# Patient Record
Sex: Male | Born: 1955
Health system: Southern US, Community
[De-identification: ages and names within clinical notes are randomized; demographics above are authoritative.]

## PROBLEM LIST (undated history)

## (undated) DIAGNOSIS — M549 Dorsalgia, unspecified: Secondary | ICD-10-CM

## (undated) DIAGNOSIS — G252 Other specified forms of tremor: Secondary | ICD-10-CM

## (undated) DIAGNOSIS — G250 Essential tremor: Secondary | ICD-10-CM

## (undated) DIAGNOSIS — J309 Allergic rhinitis, unspecified: Secondary | ICD-10-CM

## (undated) DIAGNOSIS — K219 Gastro-esophageal reflux disease without esophagitis: Secondary | ICD-10-CM

## (undated) DIAGNOSIS — F528 Other sexual dysfunction not due to a substance or known physiological condition: Secondary | ICD-10-CM

## (undated) DIAGNOSIS — M722 Plantar fascial fibromatosis: Secondary | ICD-10-CM

## (undated) DIAGNOSIS — M5090 Cervical disc disorder, unspecified, unspecified cervical region: Secondary | ICD-10-CM

## (undated) DIAGNOSIS — B351 Tinea unguium: Secondary | ICD-10-CM

## (undated) DIAGNOSIS — I10 Essential (primary) hypertension: Secondary | ICD-10-CM

## (undated) DIAGNOSIS — J45909 Unspecified asthma, uncomplicated: Secondary | ICD-10-CM

## (undated) DIAGNOSIS — K5730 Diverticulosis of large intestine without perforation or abscess without bleeding: Secondary | ICD-10-CM

## (undated) DIAGNOSIS — E119 Type 2 diabetes mellitus without complications: Secondary | ICD-10-CM

## (undated) DIAGNOSIS — E785 Hyperlipidemia, unspecified: Secondary | ICD-10-CM

## (undated) DIAGNOSIS — G4730 Sleep apnea, unspecified: Secondary | ICD-10-CM

## (undated) HISTORY — DX: Cervical disc disorder, unspecified, unspecified cervical region: M50.90

## (undated) HISTORY — DX: Unspecified asthma, uncomplicated: J45.909

## (undated) HISTORY — DX: Type 2 diabetes mellitus without complications: E11.9

## (undated) HISTORY — DX: Essential (primary) hypertension: I10

## (undated) HISTORY — DX: Allergic rhinitis, unspecified: J30.9

## (undated) HISTORY — DX: Plantar fascial fibromatosis: M72.2

## (undated) HISTORY — DX: Dorsalgia, unspecified: M54.9

## (undated) HISTORY — DX: Hyperlipidemia, unspecified: E78.5

## (undated) HISTORY — DX: Essential tremor: G25.0

## (undated) HISTORY — DX: Tinea unguium: B35.1

## (undated) HISTORY — DX: Sleep apnea, unspecified: G47.30

## (undated) HISTORY — DX: Diverticulosis of large intestine without perforation or abscess without bleeding: K57.30

## (undated) HISTORY — DX: Essential tremor: G25.2

## (undated) HISTORY — DX: Gastro-esophageal reflux disease without esophagitis: K21.9

## (undated) HISTORY — DX: Other sexual dysfunction not due to a substance or known physiological condition: F52.8

---

## 1998-01-10 ENCOUNTER — Ambulatory Visit (HOSPITAL_COMMUNITY): Admission: RE | Admit: 1998-01-10 | Discharge: 1998-01-10 | Payer: Self-pay | Admitting: Family Medicine

## 2002-11-22 ENCOUNTER — Encounter: Admission: RE | Admit: 2002-11-22 | Discharge: 2003-02-20 | Payer: Self-pay | Admitting: Internal Medicine

## 2005-01-24 ENCOUNTER — Ambulatory Visit: Payer: Self-pay | Admitting: Internal Medicine

## 2005-01-29 ENCOUNTER — Ambulatory Visit: Payer: Self-pay | Admitting: Internal Medicine

## 2006-01-28 ENCOUNTER — Ambulatory Visit: Payer: Self-pay | Admitting: Internal Medicine

## 2006-02-04 ENCOUNTER — Ambulatory Visit: Payer: Self-pay | Admitting: Internal Medicine

## 2007-03-08 ENCOUNTER — Encounter: Payer: Self-pay | Admitting: *Deleted

## 2007-03-08 DIAGNOSIS — E785 Hyperlipidemia, unspecified: Secondary | ICD-10-CM | POA: Insufficient documentation

## 2007-03-08 DIAGNOSIS — I10 Essential (primary) hypertension: Secondary | ICD-10-CM | POA: Insufficient documentation

## 2007-03-08 DIAGNOSIS — E119 Type 2 diabetes mellitus without complications: Secondary | ICD-10-CM | POA: Insufficient documentation

## 2007-03-08 DIAGNOSIS — E1122 Type 2 diabetes mellitus with diabetic chronic kidney disease: Secondary | ICD-10-CM | POA: Insufficient documentation

## 2007-03-08 DIAGNOSIS — F528 Other sexual dysfunction not due to a substance or known physiological condition: Secondary | ICD-10-CM | POA: Insufficient documentation

## 2007-03-11 ENCOUNTER — Ambulatory Visit: Payer: Self-pay | Admitting: Internal Medicine

## 2007-03-11 LAB — CONVERTED CEMR LAB
ALT: 34 U/L
AST: 25 U/L
Albumin: 3.9 g/dL
Alkaline Phosphatase: 85 U/L
BUN: 17 mg/dL
Bacteria, UA: NEGATIVE
Basophils Absolute: 0.1 K/uL
Basophils Relative: 0.7 %
Bilirubin, Direct: 0.2 mg/dL
CO2: 31 meq/L
Calcium: 9.1 mg/dL
Chloride: 102 meq/L
Cholesterol: 163 mg/dL
Creatinine, Ser: 1 mg/dL
Crystals: NEGATIVE
Eosinophils Absolute: 0.3 K/uL
Eosinophils Relative: 4.3 %
GFR calc Af Amer: 102 mL/min
GFR calc non Af Amer: 84 mL/min
Glucose, Bld: 216 mg/dL — ABNORMAL HIGH
HCT: 39.3 %
HDL: 26 mg/dL — ABNORMAL LOW
Hemoglobin, Urine: NEGATIVE
Hemoglobin: 13.5 g/dL
Hgb A1c MFr Bld: 8.5 % — ABNORMAL HIGH
Ketones, ur: NEGATIVE mg/dL
Leukocytes, UA: NEGATIVE
Lymphocytes Relative: 19.6 %
MCHC: 34.3 g/dL
MCV: 82.1 fL
Monocytes Absolute: 0.8 K/uL — ABNORMAL HIGH
Monocytes Relative: 10.1 %
Neutro Abs: 5.3 K/uL
Neutrophils Relative %: 65.3 %
Nitrite: NEGATIVE
PSA: 0.76 ng/mL
Platelets: 140 K/uL — ABNORMAL LOW
Potassium: 3.8 meq/L
RBC: 4.79 M/uL
RDW: 12.9 %
Sodium: 142 meq/L
Specific Gravity, Urine: >=1.03
Squamous Epithelial / HPF: NEGATIVE /LPF
TSH: 1.11 u[IU]/mL
Total Bilirubin: 1 mg/dL
Total CHOL/HDL Ratio: 6.3
Total Protein, Urine: 30 mg/dL — AB
Total Protein: 7 g/dL
Triglycerides: 268 mg/dL
Urine Glucose: 100 mg/dL — AB
Urobilinogen, UA: 1
VLDL: 54 mg/dL — ABNORMAL HIGH
WBC: 8.1 10*3/microliter
pH: 5.5

## 2007-03-16 ENCOUNTER — Ambulatory Visit: Payer: Self-pay | Admitting: Internal Medicine

## 2007-03-16 DIAGNOSIS — J45909 Unspecified asthma, uncomplicated: Secondary | ICD-10-CM | POA: Insufficient documentation

## 2007-03-16 DIAGNOSIS — B351 Tinea unguium: Secondary | ICD-10-CM | POA: Insufficient documentation

## 2007-03-16 DIAGNOSIS — J309 Allergic rhinitis, unspecified: Secondary | ICD-10-CM | POA: Insufficient documentation

## 2007-05-11 ENCOUNTER — Encounter: Payer: Self-pay | Admitting: Internal Medicine

## 2007-05-12 ENCOUNTER — Ambulatory Visit: Payer: Self-pay | Admitting: Internal Medicine

## 2007-05-12 LAB — CONVERTED CEMR LAB
BUN: 18 mg/dL (ref 6–23)
CO2: 29 meq/L (ref 19–32)
Calcium: 9.5 mg/dL (ref 8.4–10.5)
Chloride: 100 meq/L (ref 96–112)
Cholesterol: 160 mg/dL (ref 0–200)
Creatinine, Ser: 0.9 mg/dL (ref 0.4–1.5)
Direct LDL: 84 mg/dL
GFR calc Af Amer: 115 mL/min
GFR calc non Af Amer: 95 mL/min
Glucose, Bld: 147 mg/dL — ABNORMAL HIGH (ref 70–99)
HDL: 24 mg/dL — ABNORMAL LOW (ref >39.0)
Hgb A1c MFr Bld: 7.1 % — ABNORMAL HIGH (ref 4.6–6.0)
Potassium: 4.5 meq/L (ref 3.5–5.1)
Sodium: 137 meq/L (ref 135–145)
Testosterone: 368.37 ng/dL (ref 350.00–890)
Total CHOL/HDL Ratio: 6.7
Triglycerides: 295 mg/dL (ref 0–149)
VLDL: 59 mg/dL — ABNORMAL HIGH (ref 0–40)

## 2007-06-07 ENCOUNTER — Telehealth (INDEPENDENT_AMBULATORY_CARE_PROVIDER_SITE_OTHER): Payer: Self-pay | Admitting: *Deleted

## 2007-09-01 ENCOUNTER — Ambulatory Visit: Payer: Self-pay | Admitting: Internal Medicine

## 2007-09-01 LAB — CONVERTED CEMR LAB
BUN: 20 mg/dL (ref 6–23)
CO2: 29 meq/L (ref 19–32)
Calcium: 9.6 mg/dL (ref 8.4–10.5)
Chloride: 106 meq/L (ref 96–112)
Cholesterol: 141 mg/dL (ref 0–200)
Creatinine, Ser: 1.1 mg/dL (ref 0.4–1.5)
Direct LDL: 77.3 mg/dL
GFR calc Af Amer: 91 mL/min
GFR calc non Af Amer: 75 mL/min
Glucose, Bld: 114 mg/dL — ABNORMAL HIGH (ref 70–99)
HDL: 31.4 mg/dL — ABNORMAL LOW (ref >39.0)
Hgb A1c MFr Bld: 5.9 % (ref 4.6–6.0)
Potassium: 4.5 meq/L (ref 3.5–5.1)
Sodium: 144 meq/L (ref 135–145)
Total CHOL/HDL Ratio: 4.5
Triglycerides: 252 mg/dL (ref 0–149)
VLDL: 50 mg/dL — ABNORMAL HIGH (ref 0–40)

## 2007-09-05 ENCOUNTER — Encounter: Payer: Self-pay | Admitting: Internal Medicine

## 2007-09-06 ENCOUNTER — Ambulatory Visit: Payer: Self-pay | Admitting: Internal Medicine

## 2008-03-14 ENCOUNTER — Telehealth (INDEPENDENT_AMBULATORY_CARE_PROVIDER_SITE_OTHER): Payer: Self-pay | Admitting: *Deleted

## 2008-04-21 ENCOUNTER — Telehealth: Payer: Self-pay | Admitting: Internal Medicine

## 2008-05-01 ENCOUNTER — Ambulatory Visit: Payer: Self-pay | Admitting: Internal Medicine

## 2008-05-01 LAB — CONVERTED CEMR LAB
ALT: 30 units/L (ref 0–53)
AST: 25 units/L (ref 0–37)
Albumin: 4 g/dL (ref 3.5–5.2)
Alkaline Phosphatase: 73 units/L (ref 39–117)
BUN: 29 mg/dL — ABNORMAL HIGH (ref 6–23)
Basophils Absolute: 0.1 10*3/uL (ref 0.0–0.1)
Basophils Relative: 1.1 % (ref 0.0–3.0)
Bilirubin Urine: NEGATIVE
Bilirubin, Direct: 0.1 mg/dL (ref 0.0–0.3)
CO2: 29 meq/L (ref 19–32)
Calcium: 9.5 mg/dL (ref 8.4–10.5)
Chloride: 104 meq/L (ref 96–112)
Cholesterol: 163 mg/dL (ref 0–200)
Creatinine, Ser: 1.2 mg/dL (ref 0.4–1.5)
Direct LDL: 93.1 mg/dL
Eosinophils Absolute: 0.5 10*3/uL (ref 0.0–0.7)
Eosinophils Relative: 4.6 % (ref 0.0–5.0)
GFR calc Af Amer: 82 mL/min
GFR calc non Af Amer: 68 mL/min
Glucose, Bld: 131 mg/dL — ABNORMAL HIGH (ref 70–99)
HCT: 39.7 % (ref 39.0–52.0)
HDL: 32.9 mg/dL — ABNORMAL LOW (ref >39.0)
Hemoglobin, Urine: NEGATIVE
Hemoglobin: 14 g/dL (ref 13.0–17.0)
Hgb A1c MFr Bld: 6.2 % — ABNORMAL HIGH (ref 4.6–6.0)
Ketones, ur: NEGATIVE mg/dL
Leukocytes, UA: NEGATIVE
Lymphocytes Relative: 24.6 % (ref 12.0–46.0)
MCHC: 35.2 g/dL (ref 30.0–36.0)
MCV: 82.9 fL (ref 78.0–100.0)
Monocytes Absolute: 0.9 10*3/uL (ref 0.1–1.0)
Monocytes Relative: 9.1 % (ref 3.0–12.0)
Neutro Abs: 6 10*3/uL (ref 1.4–7.7)
Neutrophils Relative %: 60.6 % (ref 43.0–77.0)
Nitrite: NEGATIVE
PSA: 0.77 ng/mL (ref 0.10–4.00)
Platelets: 150 10*3/uL (ref 150–400)
Potassium: 4.8 meq/L (ref 3.5–5.1)
RBC: 4.79 M/uL (ref 4.22–5.81)
RDW: 13 % (ref 11.5–14.6)
Sodium: 141 meq/L (ref 135–145)
Specific Gravity, Urine: >=1.03 (ref 1.000–1.03)
TSH: 1.93 microintl units/mL (ref 0.35–5.50)
Total Bilirubin: 0.8 mg/dL (ref 0.3–1.2)
Total CHOL/HDL Ratio: 5
Total Protein, Urine: NEGATIVE mg/dL
Total Protein: 7.3 g/dL (ref 6.0–8.3)
Triglycerides: 218 mg/dL (ref 0–149)
Urine Glucose: NEGATIVE mg/dL
Urobilinogen, UA: 0.2 (ref 0.0–1.0)
VLDL: 44 mg/dL — ABNORMAL HIGH (ref 0–40)
WBC: 9.9 10*3/uL (ref 4.5–10.5)
pH: 5.5 (ref 5.0–8.0)

## 2008-05-08 ENCOUNTER — Ambulatory Visit: Payer: Self-pay | Admitting: Internal Medicine

## 2008-05-08 DIAGNOSIS — R251 Tremor, unspecified: Secondary | ICD-10-CM | POA: Insufficient documentation

## 2008-05-08 DIAGNOSIS — K219 Gastro-esophageal reflux disease without esophagitis: Secondary | ICD-10-CM | POA: Insufficient documentation

## 2008-05-08 DIAGNOSIS — R21 Rash and other nonspecific skin eruption: Secondary | ICD-10-CM | POA: Insufficient documentation

## 2008-09-12 ENCOUNTER — Telehealth (INDEPENDENT_AMBULATORY_CARE_PROVIDER_SITE_OTHER): Payer: Self-pay | Admitting: *Deleted

## 2008-09-13 ENCOUNTER — Telehealth (INDEPENDENT_AMBULATORY_CARE_PROVIDER_SITE_OTHER): Payer: Self-pay | Admitting: *Deleted

## 2008-09-15 ENCOUNTER — Ambulatory Visit: Payer: Self-pay | Admitting: Internal Medicine

## 2009-01-10 ENCOUNTER — Encounter (INDEPENDENT_AMBULATORY_CARE_PROVIDER_SITE_OTHER): Payer: Self-pay | Admitting: *Deleted

## 2009-04-17 ENCOUNTER — Telehealth: Payer: Self-pay | Admitting: Internal Medicine

## 2009-05-14 ENCOUNTER — Telehealth: Payer: Self-pay | Admitting: Internal Medicine

## 2009-05-28 ENCOUNTER — Telehealth: Payer: Self-pay | Admitting: Internal Medicine

## 2009-06-04 ENCOUNTER — Ambulatory Visit: Payer: Self-pay | Admitting: Internal Medicine

## 2009-06-04 LAB — CONVERTED CEMR LAB
ALT: 46 units/L (ref 0–53)
AST: 31 units/L (ref 0–37)
Albumin: 4.1 g/dL (ref 3.5–5.2)
Alkaline Phosphatase: 76 units/L (ref 39–117)
BUN: 26 mg/dL — ABNORMAL HIGH (ref 6–23)
Basophils Absolute: 0.1 10*3/uL (ref 0.0–0.1)
Basophils Relative: 0.6 % (ref 0.0–3.0)
Bilirubin Urine: NEGATIVE
Bilirubin, Direct: 0.1 mg/dL (ref 0.0–0.3)
CO2: 29 meq/L (ref 19–32)
Calcium: 9.5 mg/dL (ref 8.4–10.5)
Chloride: 103 meq/L (ref 96–112)
Cholesterol: 154 mg/dL (ref 0–200)
Creatinine, Ser: 1.1 mg/dL (ref 0.4–1.5)
Direct LDL: 85.5 mg/dL
Eosinophils Absolute: 0.3 10*3/uL (ref 0.0–0.7)
Eosinophils Relative: 3.2 % (ref 0.0–5.0)
GFR calc non Af Amer: 74.73 mL/min (ref >60)
Glucose, Bld: 145 mg/dL — ABNORMAL HIGH (ref 70–99)
HCT: 38.9 % — ABNORMAL LOW (ref 39.0–52.0)
HDL: 34.2 mg/dL — ABNORMAL LOW (ref >39.00)
Hemoglobin, Urine: NEGATIVE
Hemoglobin: 12.8 g/dL — ABNORMAL LOW (ref 13.0–17.0)
Leukocytes, UA: NEGATIVE
Lymphocytes Relative: 22.8 % (ref 12.0–46.0)
Lymphs Abs: 2 10*3/uL (ref 0.7–4.0)
MCHC: 32.9 g/dL (ref 30.0–36.0)
MCV: 84.1 fL (ref 78.0–100.0)
Monocytes Absolute: 0.6 10*3/uL (ref 0.1–1.0)
Monocytes Relative: 6.5 % (ref 3.0–12.0)
Neutro Abs: 5.9 10*3/uL (ref 1.4–7.7)
Neutrophils Relative %: 66.9 % (ref 43.0–77.0)
Nitrite: NEGATIVE
PSA: 1.21 ng/mL (ref 0.10–4.00)
Platelets: 149 10*3/uL — ABNORMAL LOW (ref 150.0–400.0)
Potassium: 4.5 meq/L (ref 3.5–5.1)
RBC: 4.62 M/uL (ref 4.22–5.81)
RDW: 13.8 % (ref 11.5–14.6)
Sodium: 138 meq/L (ref 135–145)
Specific Gravity, Urine: >=1.03 (ref 1.000–1.030)
TSH: 1.62 microintl units/mL (ref 0.35–5.50)
Total Bilirubin: 0.8 mg/dL (ref 0.3–1.2)
Total CHOL/HDL Ratio: 5
Total Protein: 7.6 g/dL (ref 6.0–8.3)
Triglycerides: 296 mg/dL — ABNORMAL HIGH (ref 0.0–149.0)
Urine Glucose: NEGATIVE mg/dL
Urobilinogen, UA: 0.2 (ref 0.0–1.0)
VLDL: 59.2 mg/dL — ABNORMAL HIGH (ref 0.0–40.0)
WBC: 8.9 10*3/uL (ref 4.5–10.5)
pH: 5 (ref 5.0–8.0)

## 2009-06-06 ENCOUNTER — Ambulatory Visit: Payer: Self-pay | Admitting: Internal Medicine

## 2009-06-06 ENCOUNTER — Telehealth (INDEPENDENT_AMBULATORY_CARE_PROVIDER_SITE_OTHER): Payer: Self-pay | Admitting: *Deleted

## 2009-06-07 LAB — CONVERTED CEMR LAB: Hgb A1c MFr Bld: 6.4 % (ref 4.6–6.5)

## 2009-08-09 ENCOUNTER — Encounter: Payer: Self-pay | Admitting: Internal Medicine

## 2009-11-30 ENCOUNTER — Ambulatory Visit: Payer: Self-pay | Admitting: Internal Medicine

## 2009-11-30 LAB — CONVERTED CEMR LAB
BUN: 32 mg/dL — ABNORMAL HIGH (ref 6–23)
CO2: 28 meq/L (ref 19–32)
Calcium: 9.2 mg/dL (ref 8.4–10.5)
Chloride: 104 meq/L (ref 96–112)
Cholesterol: 176 mg/dL (ref 0–200)
Creatinine, Ser: 1.3 mg/dL (ref 0.4–1.5)
Direct LDL: 107.7 mg/dL
GFR calc non Af Amer: 61.51 mL/min (ref >60)
Glucose, Bld: 103 mg/dL — ABNORMAL HIGH (ref 70–99)
HDL: 31.4 mg/dL — ABNORMAL LOW (ref >39.00)
Hgb A1c MFr Bld: 6.3 % (ref 4.6–6.5)
Potassium: 4.9 meq/L (ref 3.5–5.1)
Sodium: 141 meq/L (ref 135–145)
Total CHOL/HDL Ratio: 6
Triglycerides: 280 mg/dL — ABNORMAL HIGH (ref 0.0–149.0)
VLDL: 56 mg/dL — ABNORMAL HIGH (ref 0.0–40.0)

## 2009-12-12 ENCOUNTER — Ambulatory Visit: Payer: Self-pay | Admitting: Internal Medicine

## 2009-12-12 DIAGNOSIS — M722 Plantar fascial fibromatosis: Secondary | ICD-10-CM | POA: Insufficient documentation

## 2009-12-12 DIAGNOSIS — K5730 Diverticulosis of large intestine without perforation or abscess without bleeding: Secondary | ICD-10-CM | POA: Insufficient documentation

## 2009-12-12 DIAGNOSIS — G4710 Hypersomnia, unspecified: Secondary | ICD-10-CM | POA: Insufficient documentation

## 2009-12-17 ENCOUNTER — Encounter: Payer: Self-pay | Admitting: Internal Medicine

## 2009-12-17 DIAGNOSIS — R259 Unspecified abnormal involuntary movements: Secondary | ICD-10-CM | POA: Insufficient documentation

## 2010-01-18 ENCOUNTER — Ambulatory Visit: Payer: Self-pay | Admitting: Internal Medicine

## 2010-01-18 DIAGNOSIS — M549 Dorsalgia, unspecified: Secondary | ICD-10-CM | POA: Insufficient documentation

## 2010-01-22 ENCOUNTER — Telehealth: Payer: Self-pay | Admitting: Internal Medicine

## 2010-01-25 ENCOUNTER — Ambulatory Visit: Payer: Self-pay | Admitting: Pulmonary Disease

## 2010-01-25 DIAGNOSIS — G4730 Sleep apnea, unspecified: Secondary | ICD-10-CM | POA: Insufficient documentation

## 2010-01-30 ENCOUNTER — Encounter: Payer: Self-pay | Admitting: Internal Medicine

## 2010-02-01 ENCOUNTER — Encounter: Admission: RE | Admit: 2010-02-01 | Discharge: 2010-02-01 | Payer: Self-pay | Admitting: Neurology

## 2010-02-27 ENCOUNTER — Encounter: Payer: Self-pay | Admitting: Internal Medicine

## 2010-02-27 ENCOUNTER — Ambulatory Visit (HOSPITAL_BASED_OUTPATIENT_CLINIC_OR_DEPARTMENT_OTHER): Admission: RE | Admit: 2010-02-27 | Discharge: 2010-02-27 | Payer: Self-pay | Admitting: Pulmonary Disease

## 2010-02-27 ENCOUNTER — Encounter: Payer: Self-pay | Admitting: Pulmonary Disease

## 2010-02-27 ENCOUNTER — Encounter: Admission: RE | Admit: 2010-02-27 | Discharge: 2010-02-27 | Payer: Self-pay | Admitting: Neurosurgery

## 2010-03-11 ENCOUNTER — Encounter: Payer: Self-pay | Admitting: Internal Medicine

## 2010-03-12 ENCOUNTER — Ambulatory Visit: Payer: Self-pay | Admitting: Pulmonary Disease

## 2010-03-13 ENCOUNTER — Ambulatory Visit: Payer: Self-pay | Admitting: Pulmonary Disease

## 2010-03-18 ENCOUNTER — Encounter: Payer: Self-pay | Admitting: Pulmonary Disease

## 2010-04-08 ENCOUNTER — Ambulatory Visit (HOSPITAL_COMMUNITY): Admission: RE | Admit: 2010-04-08 | Discharge: 2010-04-09 | Payer: Self-pay | Admitting: Neurosurgery

## 2010-04-08 HISTORY — PX: NECK SURGERY: SHX720

## 2010-04-16 ENCOUNTER — Encounter: Payer: Self-pay | Admitting: Pulmonary Disease

## 2010-04-19 ENCOUNTER — Ambulatory Visit: Payer: Self-pay | Admitting: Pulmonary Disease

## 2010-05-16 ENCOUNTER — Encounter
Admission: RE | Admit: 2010-05-16 | Discharge: 2010-05-16 | Payer: Self-pay | Source: Home / Self Care | Attending: Neurosurgery | Admitting: Neurosurgery

## 2010-05-28 ENCOUNTER — Encounter: Payer: Self-pay | Admitting: Pulmonary Disease

## 2010-06-04 ENCOUNTER — Other Ambulatory Visit: Payer: Self-pay | Admitting: Internal Medicine

## 2010-06-04 ENCOUNTER — Ambulatory Visit
Admission: RE | Admit: 2010-06-04 | Discharge: 2010-06-04 | Payer: Self-pay | Source: Home / Self Care | Attending: Internal Medicine | Admitting: Internal Medicine

## 2010-06-04 LAB — LIPID PANEL
Cholesterol: 175 mg/dL (ref 0–200)
HDL: 31.6 mg/dL — ABNORMAL LOW (ref >39.00)
Total CHOL/HDL Ratio: 6
Triglycerides: 389 mg/dL — ABNORMAL HIGH (ref 0.0–149.0)
VLDL: 77.8 mg/dL — ABNORMAL HIGH (ref 0.0–40.0)

## 2010-06-04 LAB — URINALYSIS
Bilirubin Urine: NEGATIVE
Hemoglobin, Urine: NEGATIVE
Leukocytes, UA: NEGATIVE
Nitrite: NEGATIVE
Specific Gravity, Urine: >=1.03 (ref 1.000–1.030)
Total Protein, Urine: NEGATIVE
Urobilinogen, UA: 0.2 (ref 0.0–1.0)
pH: 5 (ref 5.0–8.0)

## 2010-06-04 LAB — CBC WITH DIFFERENTIAL/PLATELET
Basophils Absolute: 0.1 10*3/uL (ref 0.0–0.1)
Basophils Relative: 0.6 % (ref 0.0–3.0)
Eosinophils Absolute: 0.2 10*3/uL (ref 0.0–0.7)
HCT: 37.5 % — ABNORMAL LOW (ref 39.0–52.0)
Hemoglobin: 12.9 g/dL — ABNORMAL LOW (ref 13.0–17.0)
Lymphocytes Relative: 19.1 % (ref 12.0–46.0)
Lymphs Abs: 1.7 10*3/uL (ref 0.7–4.0)
MCHC: 34.3 g/dL (ref 30.0–36.0)
MCV: 81.5 fl (ref 78.0–100.0)
Monocytes Absolute: 0.6 10*3/uL (ref 0.1–1.0)
Monocytes Relative: 6.2 % (ref 3.0–12.0)
Neutrophils Relative %: 71.8 % (ref 43.0–77.0)
RBC: 4.61 Mil/uL (ref 4.22–5.81)
RDW: 14.2 % (ref 11.5–14.6)
WBC: 9.1 10*3/uL (ref 4.5–10.5)

## 2010-06-04 LAB — BASIC METABOLIC PANEL
BUN: 20 mg/dL (ref 6–23)
CO2: 29 mEq/L (ref 19–32)
Calcium: 9.2 mg/dL (ref 8.4–10.5)
Chloride: 103 mEq/L (ref 96–112)
Creatinine, Ser: 1 mg/dL (ref 0.4–1.5)
Glucose, Bld: 138 mg/dL — ABNORMAL HIGH (ref 70–99)
Sodium: 140 mEq/L (ref 135–145)

## 2010-06-04 LAB — HEPATIC FUNCTION PANEL
ALT: 36 U/L (ref 0–53)
Albumin: 3.9 g/dL (ref 3.5–5.2)
Alkaline Phosphatase: 84 U/L (ref 39–117)
Bilirubin, Direct: 0.1 mg/dL (ref 0.0–0.3)
Total Protein: 7.3 g/dL (ref 6.0–8.3)

## 2010-06-04 LAB — HEMOGLOBIN A1C: Hgb A1c MFr Bld: 7.4 % — ABNORMAL HIGH (ref 4.6–6.5)

## 2010-06-04 LAB — MICROALBUMIN / CREATININE URINE RATIO
Creatinine,U: 306.6 mg/dL
Microalb Creat Ratio: 0.6 mg/g (ref 0.0–30.0)

## 2010-06-04 LAB — PSA: PSA: 0.72 ng/mL (ref 0.10–4.00)

## 2010-06-04 LAB — LDL CHOLESTEROL, DIRECT: Direct LDL: 97.2 mg/dL

## 2010-06-04 LAB — TSH: TSH: 1.64 u[IU]/mL (ref 0.35–5.50)

## 2010-06-10 ENCOUNTER — Encounter: Payer: Self-pay | Admitting: Internal Medicine

## 2010-06-10 ENCOUNTER — Ambulatory Visit
Admission: RE | Admit: 2010-06-10 | Discharge: 2010-06-10 | Payer: Self-pay | Source: Home / Self Care | Attending: Internal Medicine | Admitting: Internal Medicine

## 2010-06-11 NOTE — Assessment & Plan Note (Signed)
Summary: 6 MO ROV /NWS  #   Vital Signs:  Patient profile:   55 year old male Height:      72 inches Weight:      312.13 pounds BMI:     42.49 O2 Sat:      95 % on Room air Temp:     97.1 degrees F oral Pulse rate:   72 / minute BP sitting:   100 / 62  (left arm) Cuff size:   regular  Vitals Entered By: Zella Ball Ewing CMA Duncan Dull) (December 12, 2009 8:18 AM)  O2 Flow:  Room air  Preventive Care Screening  Colonoscopy:    Date:  08/09/2009    Next Due:  08/2019    Results:  Diverticulosis   CC: 6 month ROV/RE   CC:  6 month ROV/RE.  History of Present Illness: here to f/u;  overall doing okl Pt denies CP, sob, doe, wheezing, orthopnea, pnd, worsening LE edema, palps, dizziness or syncope  Pt denies new neuro symptoms such as headache, facial or extremity weakness  Pt denies polydipsia, polyuria, but does have occasionallow sugar symptoms such as shakiness improved with eating - lowest of 85.  Overall good compliance with meds, trying to follow low chol, DM diet, wt stable, little excercise however .  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   Does also have mild to mod left plantar pain to ambulate, worse with the first few steps in the am..  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     Problems Prior to Update: 1)  Gerd  (ICD-530.81) 2)  Familial Tremor  (ICD-333.1) 3)  Rash-nonvesicular  (ICD-782.1) 4)  Onychomycosis, Toenails  (ICD-110.1) 5)  Preventive Health Care  (ICD-V70.0) 6)  Allergic Rhinitis  (ICD-477.9) 7)  Asthma  (ICD-493.90) 8)  Onychomycosis, Toenails  (ICD-110.1) 9)  Family History Diabetes 1st Degree Relative  (ICD-V18.0) 10)  Preventive Health Care   (ICD-V70.0) 11)  Asthma Nos w/o Status Asthmaticus  (ICD-493.90) 12)  Erectile Dysfunction  (ICD-302.72) 13)  Diabetes Mellitus  (ICD-250.00) 14)  Hyperlipidemia  (ICD-272.4) 15)  Hypertension  (ICD-401.9)  Medications Prior to Update: 1)  Lipitor 40 Mg Tabs (Atorvastatin Calcium) .... Take 1 Tablet By Mouth Once A Day 2)  Lisinopril-Hydrochlorothiazide 20-12.5 Mg Tabs (Lisinopril-Hydrochlorothiazide) .... Take 2 Tablet By Mouth Once A Day 3)  Metformin Hcl 500 Mg Tb24 (Metformin Hcl) .... Take 2 Tablet By Mouth Twice A Day 4)  Aspirin 81 Mg  Tabs (Aspirin) .... Take One Tablet Once Daily 5)  Alavert 10 Mg  Tabs (Loratadine) .... Take As Needed and As Directed 6)  Albuterol 90 Mcg/act  Aers (Albuterol) .... 2 Puffs Qid Prn 7)  Januvia 100 Mg  Tabs (Sitagliptin Phosphate) .Marland Kitchen.. 1 By Mouth Once Daily 8)  Glimepiride 1 Mg  Tabs (Glimepiride) .Marland Kitchen.. 1 By Mouth Once Daily 9)  Niacin 500 Mg  Tabs (Niacin) .... 3 By Mouth Once Daily 10)  Freestyle Test  Strp (Glucose Blood) .... Test Blood Sugar Once Daily 11)  Freestyle Lancets  Misc (Lancets) .... Use As Directed 12)  Atenolol 25 Mg Tabs (Atenolol) .Marland Kitchen.. 1po Once Daily 13)  Omeprazole 20  Mg Tbec (Omeprazole) .Marland Kitchen.. 1po Once Daily  Current Medications (verified): 1)  Lipitor 40 Mg Tabs (Atorvastatin Calcium) .... Take 1 Tablet By Mouth Once A Day 2)  Lisinopril-Hydrochlorothiazide 20-12.5 Mg Tabs (Lisinopril-Hydrochlorothiazide) .... Take 2 Tablet By Mouth Once A Day 3)  Metformin Hcl 500 Mg Tb24 (Metformin Hcl) .... Take 2 Tablet By Mouth Twice A Day 4)  Aspirin 81 Mg  Tabs (Aspirin) .... Take One Tablet Once Daily 5)  Alavert 10 Mg  Tabs (Loratadine) .... Take As Needed and As Directed 6)  Albuterol 90 Mcg/act  Aers (Albuterol) .... 2 Puffs Qid Prn 7)  Januvia 100 Mg  Tabs (Sitagliptin Phosphate) .Marland Kitchen.. 1 By Mouth Once Daily 8)  Glimepiride 1 Mg  Tabs (Glimepiride) .Marland Kitchen.. 1 By Mouth Once Daily 9)  Niacin 500 Mg  Tabs (Niacin) .... 3 By Mouth Once  Daily 10)  Freestyle Lite Test  Strp (Glucose Blood) .... Use Asd 1 Once Daily 11)  Freestyle Lancets  Misc (Lancets) .... Use As Directed 12)  Atenolol 25 Mg Tabs (Atenolol) .Marland Kitchen.. 1po Once Daily 13)  Omeprazole 20 Mg Tbec (Omeprazole) .Marland Kitchen.. 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   Head:  normocephalic and atraumatic.   Eyes:  vision grossly intact, pupils equal, and pupils round.   Ears:  R ear normal and L ear normal.   Nose:  no external deformity and no nasal discharge.   Mouth:  no gingival abnormalities and pharynx pink and moist.   Neck:  supple and no masses.   Lungs:  normal respiratory effort and normal breath sounds.   Heart:  normal rate and regular rhythm.   Abdomen:  soft, non-tender, and normal bowel sounds.   Extremities:  no edema, no erythema  Neurologic:  mild tremor noted   Impression & Recommendations:  Problem # 1:  PLANTAR FASCIITIS, LEFT (ICD-728.71)  ok for naproxen two times a day as needed ; declines podiatry at this time  His updated medication list for this problem includes:    Naproxen 500 Mg Tabs (Naproxen) .Marland Kitchen... 1po two times a day as needed  Problem # 2:  DIABETES MELLITUS (ICD-250.00)  His updated medication list for this problem includes:    Lisinopril-hydrochlorothiazide 20-12.5 Mg Tabs (Lisinopril-hydrochlorothiazide)  .Marland Kitchen... Take 2 tablet by mouth once a day    Metformin Hcl 500 Mg Tb24 (Metformin hcl) .Marland Kitchen... Take 2 tablet by mouth twice a day    Aspirin 81 Mg Tabs (Aspirin) .Marland Kitchen... Take one tablet once daily    Januvia 100 Mg Tabs (Sitagliptin phosphate) .Marland Kitchen... 1 by mouth once daily    Glimepiride 1 Mg Tabs (Glimepiride) .Marland Kitchen... 1/2 by mouth once daily  Labs Reviewed: Creat: 1.3 (11/30/2009)    Reviewed HgBA1c results: 6.3 (11/30/2009)  6.4 (06/04/2009) stable overall by hx and exam, ok to continue meds/tx as is except occas low sugar so to take Half of the glimeparide, Pt to cont DM diet, excercise, wt loss effort  Problem # 3:  HYPERTENSION (ICD-401.9)  His updated medication list for this problem includes:    Lisinopril-hydrochlorothiazide 20-12.5 Mg Tabs (Lisinopril-hydrochlorothiazide) .Marland Kitchen... Take 2 tablet by mouth once a day    Atenolol 25 Mg Tabs (Atenolol) .Marland Kitchen... 1po once daily  BP today: 100/62 Prior BP: 118/80 (06/06/2009)  Labs Reviewed: K+: 4.9 (11/30/2009) Creat: : 1.3 (11/30/2009)   Chol: 176 (11/30/2009)   HDL: 31.40 (11/30/2009)   LDL: DEL (05/01/2008)   TG: 280.0 (11/30/2009) stable overall by hx and exam, ok to continue meds/tx as is   Problem # 4:  FAMILIAL TREMOR (ICD-333.1)  for neuro referral - improved but persists on the low dose beta blocker  Orders: Nephrology Referral (Nephro)  Problem # 5:  HYPERSOMNIA (ICD-780.54)  ok to refer to pulm - likely has OSA   Orders: Pulmonary Referral (Pulmonary)  Complete Medication List: 1)  Lipitor 40 Mg Tabs (Atorvastatin calcium) .... Take 1 tablet by mouth once a day 2)  Lisinopril-hydrochlorothiazide 20-12.5 Mg Tabs (Lisinopril-hydrochlorothiazide) .... Take 2 tablet by mouth once a day 3)  Metformin Hcl 500 Mg Tb24 (Metformin hcl) .... Take 2 tablet by mouth twice a day 4)  Aspirin 81 Mg Tabs (Aspirin) .... Take one tablet once daily 5)  Alavert 10 Mg Tabs (Loratadine) .... Take as needed and as directed 6)  Albuterol 90  Mcg/act Aers (Albuterol) .... 2 puffs qid prn 7)  Januvia 100 Mg Tabs (Sitagliptin phosphate) .Marland Kitchen.. 1 by mouth once daily 8)  Glimepiride 1 Mg Tabs (Glimepiride) .... 1/2 by mouth once daily 9)  Niacin 500 Mg Tabs (Niacin) .... 3 by mouth once daily 10)  Freestyle Lite Test Strp (Glucose blood) .... Use asd 1 once daily 11)  Freestyle Lancets Misc (Lancets) .... Use as directed 12)  Atenolol 25 Mg Tabs (Atenolol) .Marland Kitchen.. 1po once daily 13)  Omeprazole 20 Mg Tbec (Omeprazole) .Marland Kitchen.. 1po once daily 14)  Naproxen 500 Mg Tabs (Naproxen) .Marland Kitchen.. 1po two times a day as needed  Patient Instructions: 1)  Please take all new medications as prescribed - the naproxen as needed  2)  please decrease the glimeparide to HALF of the 1 mg per day 3)  Continue all previous medications as before this visit  4)  You will be contacted about the referral(s) to: Neurology, and pulmonary 5)  Please call if your heel pain is worse for podiatry referral 6)  Please schedule a follow-up appointment in 6 months with CPX labs and: 7)  HbgA1C prior to visit, ICD-9: 250.02 8)  Urine Microalbumin prior to visit, ICD-9: Prescriptions: NAPROXEN 500 MG TABS (NAPROXEN) 1po two times a day as needed  #60 x 3   Entered and Authorized by:   Corwin Levins MD   Signed by:   Corwin Levins MD on 12/12/2009   Method used:   Print then Give to Patient   RxID:   [id number]

## 2010-06-11 NOTE — Progress Notes (Signed)
Summary: RX request  Phone Note Call from Patient Call back at Work Phone [phone]   Caller: Patient Summary of Call: pt called requesting written rx's to send to online pharmacy for Januvia, omeprazole and Lipitor Initial call taken by: Margaret Pyle, CMA,  May 14, 2009 9:35 AM  Follow-up for Phone Call        routine meds to [facility] - appears he is due for yearly visit Follow-up by: Corwin Levins MD,  May 14, 2009 10:08 AM    Prescriptions: OMEPRAZOLE 20 MG TBEC (OMEPRAZOLE) 1po once daily  #90 x 3   Entered by:   Scharlene Gloss   Authorized by:   Corwin Levins MD   Signed by:   Scharlene Gloss on 05/14/2009   Method used:   Print then Give to Patient   RxID:   [id number] JANUVIA 100 MG  TABS (SITAGLIPTIN PHOSPHATE) 1 by mouth qd  #90 x 3   Entered by:   Scharlene Gloss   Authorized by:   Corwin Levins MD   Signed by:   Scharlene Gloss on 05/14/2009   Method used:   Print then Give to Patient   RxID:   [id number] LIPITOR 40 MG TABS (ATORVASTATIN CALCIUM) Take 1 tablet by mouth once a day  #90 Tablet x 3   Entered by:   Scharlene Gloss   Authorized by:   Corwin Levins MD   Signed by:   Scharlene Gloss on 05/14/2009   Method used:   Print then Give to Patient   RxID:   [id number]

## 2010-06-11 NOTE — Letter (Signed)
Summary: [facility]  [facility]   Imported By: Lennie Odor 02/04/2010 10:28:05  _____________________________________________________________________  External Attachment:    Type:   Image     Comment:   External Document

## 2010-06-11 NOTE — Assessment & Plan Note (Signed)
Summary: HYPERSOMNIA/ MBW   CC:  Pt here for sleep study .  History of Present Illness: 54/M, obese diabetic, hypertensive for evaluation of obstructive sleep apnea.  Wife prompted referral due to heavy snoring & witnessed apneas. Epworth Sleepiness Score 5/24  bedtime 10 pm ,sleep latency minimal, 1-2 awakenings, denies nocturia, wakes up at 0600 tired, sleps on his side with 2 pillows,  on weekends , sleeps until 0800 - never fully  rested, no dry mouth, no headaches. Drinks iced tead & soda 20oz/day, no coffee, has gained 25 lbs over last 2 years. There is no history suggestive of cataplexy, sleep paralysis or parasomnias   Preventive Screening-Counseling & Management  Alcohol-Tobacco     Smoking Status: never   History of Present Illness: Not sleeping well  What time do you typically go to bed?(between what hours): 10pm  How long does it take you to fall asleep? varies  How many times during the night do you wake up? at least once  What time do you get out of bed to start your day? 6am  Do you drive or operate heavy machinery in your occupation? no  How much has your weight changed (up or down) over the past two years? (in pounds): 25 lb increase  Have you ever had a sleep study before?  If yes,when and where: no  Do you currently use CPAP ? If so , at what pressure? no  Do you wear oxygen at any time? If yes, how many liters per minute? no Current Medications (verified): 1)  Lipitor 40 Mg Tabs (Atorvastatin Calcium) .... Take 1 Tablet By Mouth Once A Day 2)  Lisinopril-Hydrochlorothiazide 20-12.5 Mg Tabs (Lisinopril-Hydrochlorothiazide) .... Take 2 Tablet By Mouth Once A Day 3)  Metformin Hcl 500 Mg Tb24 (Metformin Hcl) .... Take 2 Tablet By Mouth Twice A Day 4)  Aspirin 81 Mg  Tabs (Aspirin) .... Take One Tablet Once Daily 5)  Alavert 10 Mg  Tabs (Loratadine) .... Take As Needed and As Directed 6)  Albuterol 90 Mcg/act  Aers (Albuterol) .... 2 Puffs Qid Prn 7)   Januvia 100 Mg  Tabs (Sitagliptin Phosphate) .Marland Kitchen.. 1 By Mouth Once Daily 8)  Glimepiride 1 Mg  Tabs (Glimepiride) .... 1/2 By Mouth Once Daily 9)  Niacin 500 Mg  Tabs (Niacin) .... 3 By Mouth Once Daily 10)  Freestyle Lite Test  Strp (Glucose Blood) .... Use Asd 1 Once Daily 11)  Freestyle Lancets  Misc (Lancets) .... Use As Directed 12)  Atenolol 25 Mg Tabs (Atenolol) .Marland Kitchen.. 1po Once Daily 13)  Omeprazole 20 Mg Tbec (Omeprazole) .Marland Kitchen.. 1po Once Daily 14)  Naproxen 500 Mg Tabs (Naproxen) .Marland Kitchen.. 1po Two Times A Day As Needed 15)  Oxycodone Hcl 5 Mg Caps (Oxycodone Hcl) .Marland Kitchen.. 1-2  By Mouth Q 6 Hrs As Needed - 16)  Flexeril 5 Mg Tabs (Cyclobenzaprine Hcl) .Marland Kitchen.. 1po Three Times A Day As Needed  Allergies (verified): No Known Drug Allergies  Past History:  Past Medical History: Last updated: 12/12/2009 ERECTILE DYSFUNCTION (ICD-302.72) DIABETES MELLITUS (ICD-250.00) HYPERLIPIDEMIA (ICD-272.4) HYPERTENSION (ICD-401.9) Asthma Allergic rhinitis GERD essential tremor Diverticulosis, colon  Past Surgical History: Last updated: 03/08/2007 no surgical history  Family History: Last updated: 05/08/2008 father with prostate cancer Family History Diabetes 1st degree relative - father, mother and sister Family History Hypertension mother with sleep apnea grandfather with chf great-grandfather with lung cancer muliple with essential tremor  Social History: Last updated: 09/06/2007 Never Smoked Alcohol use-yes - rare Married 2 children  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  The patient denies shortness of breath with activity, shortness of breath at rest, productive cough, non-productive cough, coughing up blood, chest pain, irregular heartbeats, loss of appetite, weight change, abdominal pain, difficulty swallowing, sore throat, tooth/dental problems, headaches, nasal congestion/difficulty  breathing through nose, sneezing, itching, ear ache, anxiety, depression, hand/feet swelling, rash, change in color of mucus, and fever.    Vital Signs:  Patient profile:   55 year old male Height:      72 inches Weight:      318.38 pounds BMI:     43.34 O2 Sat:      96 % on Room air Temp:     98.8 degrees F oral Pulse rate:   77 / minute BP sitting:   110 / 70  (left arm) Cuff size:   large  Vitals Entered By: Zackery Barefoot CMA (January 25, 2010 4:11 PM)  O2 Flow:  Room air CC: Pt here for sleep study  Comments Medications reviewed with patient Verified contact number and pharmacy with patient Zackery Barefoot [facility]  January 25, 2010 4:11 PM    Physical Exam  Additional Exam:  wt 318 January 25, 2010  Gen. Pleasant, obese, in no distress, normal affect ENT - no lesions, no post nasal drip, class 3 airway Neck: No JVD, no thyromegaly, no carotid bruits Lungs: no use of accessory muscles, no dullness to percussion, clear without rales or rhonchi  Cardiovascular: Rhythm regular, heart sounds  normal, no murmurs or gallops, no peripheral edema Abdomen: soft and non-tender, no hepatosplenomegaly, BS normal. Musculoskeletal: No deformities, no cyanosis or clubbing Neuro:  alert, non focal     Impression & Recommendations:  Problem # 1:  OBSTRUCTIVE SLEEP APNEA (ICD-780.57)  Given loud snoring, witnessed apneas, morbid obesity & narrow pharyngeal exam, obstructive sleep apnea is very likely & an overnight PSg wil be scheduled as a split study. The pathophysiology of obstructive sleep apnea, it's cardiovascular consequences and modes of treatment including CPAP were discussed with the patient in great detail.   Orders: Consultation Level III [phone])  Other Orders: Sleep Disorder Referral (Sleep Disorder)  Patient Instructions: 1)  Copy sent to: dr Jonny Ruiz 2)  Please schedule a follow-up appointment in 2 weeks after sleep study

## 2010-06-11 NOTE — Progress Notes (Signed)
Summary: GI-Referral  Phone Note Outgoing Call   Call placed by: Dagoberto Reef,  June 06, 2009 10:34 AM Summary of Call: Dr Jonny Ruiz is this for a colonoscopy? Initial call taken by: Dagoberto Reef,  June 06, 2009 10:35 AM  Follow-up for Phone Call        yes, but I guess he will need OV with dr Randa Evens in order to then be scheduled for the colonoscopy (unless dr Randa Evens will do the colonoscopy without the OV first) Follow-up by: Corwin Levins MD,  June 06, 2009 1:16 PM

## 2010-06-11 NOTE — Procedures (Signed)
Summary: Tresea Mall MD  Tresea Mall MD   Imported By: Lester Yabucoa 08/20/2009 10:05:27  _____________________________________________________________________  External Attachment:    Type:   Image     Comment:   External Document

## 2010-06-11 NOTE — Assessment & Plan Note (Signed)
Summary: ?PULLED MUSCLE/CD   Vital Signs:  Patient profile:   55 year old male Height:      72 inches Weight:      318.38 pounds BMI:     43.34 O2 Sat:      95 % on Room air Temp:     98.4 degrees F oral Pulse rate:   72 / minute BP sitting:   136 / 78  (left arm) Cuff size:   large  Vitals Entered By: Zella Ball Ewing CMA (AAMA) (January 18, 2010 2:26 PM)  O2 Flow:  Room air CC: Pulled muscle in right shoulder/RE   CC:  Pulled muscle in right shoulder/RE.  History of Present Illness: here to f/u - c/o right "shoulder" pain, started this am, seemed to hear a "snap" after a hard sneeze just prior to going to work today - then onset pain actually to the right medial periscapular area;  better to move the right arm and keep overhead and has FROM to the actual shoulder, pain still about 7/10 despite naproxn two times a day, burning quality, worse to move the right arm across the boty; no neck pain or RUE radicaular symptoms, numb or weakness,  no bowel or bladder change, gait change, fall or injury.  No fever, wt loss, night sweats, loss of appetite or other constitutional symptoms Pt denies CP, worsening sob, doe, wheezing, orthopnea, pnd, worsening LE edema, palps, dizziness or syncope Pt denies new neuro symptoms such as headache, facial or extremity weakness Pt denies polydipsia, polyuria, or low sugar symptoms such as shakiness improved with eating.  Overall good compliance with meds, trying to follow low chol, DM diet, wt stable, little excercise however   Problems Prior to Update: 1)  Back Pain  (ICD-724.5) 2)  Tremor  (ICD-781.0) 3)  Hypersomnia  (ICD-780.54) 4)  Plantar Fasciitis, Left  (ICD-728.71) 5)  Diverticulosis, Colon  (ICD-562.10) 6)  Gerd  (ICD-530.81) 7)  Familial Tremor  (ICD-333.1) 8)  Rash-nonvesicular  (ICD-782.1) 9)  Onychomycosis, Toenails  (ICD-110.1) 10)  Preventive Health Care  (ICD-V70.0) 11)  Allergic Rhinitis  (ICD-477.9) 12)  Asthma  (ICD-493.90) 13)   Onychomycosis, Toenails  (ICD-110.1) 14)  Family History Diabetes 1st Degree Relative  (ICD-V18.0) 15)  Preventive Health Care  (ICD-V70.0) 16)  Asthma Nos w/o Status Asthmaticus  (ICD-493.90) 17)  Erectile Dysfunction  (ICD-302.72) 18)  Diabetes Mellitus  (ICD-250.00) 19)  Hyperlipidemia  (ICD-272.4) 20)  Hypertension  (ICD-401.9)  Medications Prior to Update: 1)  Lipitor 40 Mg Tabs (Atorvastatin Calcium) .... Take 1 Tablet By Mouth Once A Day 2)  Lisinopril-Hydrochlorothiazide 20-12.5 Mg Tabs (Lisinopril-Hydrochlorothiazide) .... Take 2 Tablet By Mouth Once A Day 3)  Metformin Hcl 500 Mg Tb24 (Metformin Hcl) .... Take 2 Tablet By Mouth Twice A Day 4)  Aspirin 81 Mg  Tabs (Aspirin) .... Take One Tablet Once Daily 5)  Alavert 10 Mg  Tabs (Loratadine) .... Take As Needed and As Directed 6)  Albuterol 90 Mcg/act  Aers (Albuterol) .... 2 Puffs Qid Prn 7)  Januvia 100 Mg  Tabs (Sitagliptin Phosphate) .Marland Kitchen.. 1 By Mouth Once Daily 8)  Glimepiride 1 Mg  Tabs (Glimepiride) .... 1/2 By Mouth Once Daily 9)  Niacin 500 Mg  Tabs (Niacin) .... 3 By Mouth Once Daily 10)  Freestyle Lite Test  Strp (Glucose Blood) .... Use Asd 1 Once Daily 11)  Freestyle Lancets  Misc (Lancets) .... Use As Directed 12)  Atenolol 25 Mg Tabs (Atenolol) .Marland Kitchen.. 1po Once Daily  13)  Omeprazole 20 Mg Tbec (Omeprazole) .Marland Kitchen.. 1po Once Daily 14)  Naproxen 500 Mg Tabs (Naproxen) .Marland Kitchen.. 1po Two Times A Day As Needed  Current Medications (verified): 1)  Lipitor 40 Mg Tabs (Atorvastatin Calcium) .... Take 1 Tablet By Mouth Once A Day 2)  Lisinopril-Hydrochlorothiazide 20-12.5 Mg Tabs (Lisinopril-Hydrochlorothiazide) .... Take 2 Tablet By Mouth Once A Day 3)  Metformin Hcl 500 Mg Tb24 (Metformin Hcl) .... Take 2 Tablet By Mouth Twice A Day 4)  Aspirin 81 Mg  Tabs (Aspirin) .... Take One Tablet Once Daily 5)  Alavert 10 Mg  Tabs (Loratadine) .... Take As Needed and As Directed 6)  Albuterol 90 Mcg/act  Aers (Albuterol) .... 2 Puffs Qid  Prn 7)  Januvia 100 Mg  Tabs (Sitagliptin Phosphate) .Marland Kitchen.. 1 By Mouth Once Daily 8)  Glimepiride 1 Mg  Tabs (Glimepiride) .... 1/2 By Mouth Once Daily 9)  Niacin 500 Mg  Tabs (Niacin) .... 3 By Mouth Once Daily 10)  Freestyle Lite Test  Strp (Glucose Blood) .... Use Asd 1 Once Daily 11)  Freestyle Lancets  Misc (Lancets) .... Use As Directed 12)  Atenolol 25 Mg Tabs (Atenolol) .Marland Kitchen.. 1po Once Daily 13)  Omeprazole 20 Mg Tbec (Omeprazole) .Marland Kitchen.. 1po Once Daily 14)  Naproxen 500 Mg Tabs (Naproxen) .Marland Kitchen.. 1po Two Times A Day As Needed 15)  Oxycodone Hcl 5 Mg Caps (Oxycodone Hcl) .Marland Kitchen.. 1-2  By Mouth Q 6 Hrs As Needed 16)  Flexeril 5 Mg Tabs (Cyclobenzaprine Hcl) .Marland Kitchen.. 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   Head:  normocephalic and atraumatic.   Eyes:  vision grossly intact, pupils equal, and pupils round.   Ears:  R ear normal and L ear normal.   Nose:  no external deformity and no nasal discharge.   Mouth:  no gingival abnormalities and pharynx pink and moist.   Neck:  supple and no masses.   Lungs:  normal respiratory effort and normal breath sounds.   Heart:  normal rate and regular rhythm.   Abdomen:  soft, non-tender, and normal bowel sounds.   Msk:  moderate tender to medial periscapular area without swelling or erythema or  rash Extremities:  no edema, no erythema  Neurologic:  strength normal in all extremities and gait normal.     Impression & Recommendations:  Problem # 1:  BACK PAIN (ICD-724.5)  His updated medication list for this problem includes:    Aspirin 81 Mg Tabs (Aspirin) .Marland Kitchen... Take one tablet once daily    Naproxen 500 Mg Tabs (Naproxen) .Marland Kitchen... 1po two times a day as needed    Oxycodone Hcl 5 Mg Caps (Oxycodone hcl) .Marland Kitchen... 1-2  by mouth q 6 hrs as needed    Flexeril 5 Mg Tabs (Cyclobenzaprine hcl) .Marland Kitchen... 1po three times a day as needed marked right periscapular MSK strain - treat as above, f/u any worsening signs or symptoms   Problem #  2:  HYPERTENSION (ICD-401.9)  His updated medication list for this problem includes:    Lisinopril-hydrochlorothiazide 20-12.5 Mg Tabs (Lisinopril-hydrochlorothiazide) .Marland Kitchen... Take 2 tablet by mouth once a day    Atenolol 25 Mg Tabs (Atenolol) .Marland Kitchen... 1po once daily  BP today: 136/78 Prior BP: 100/62 (12/12/2009)  Labs Reviewed: K+: 4.9 (11/30/2009) Creat: : 1.3 (11/30/2009)   Chol: 176 (11/30/2009)   HDL: 31.40 (11/30/2009)   LDL: DEL (05/01/2008)   TG: 280.0 (11/30/2009) stable overall by hx and exam, ok to continue meds/tx as is   Problem # 3:  DIABETES MELLITUS (ICD-250.00)  His updated medication list for this problem includes:    Lisinopril-hydrochlorothiazide 20-12.5 Mg Tabs (Lisinopril-hydrochlorothiazide) .Marland Kitchen... Take 2 tablet by mouth once a day    Metformin Hcl 500 Mg Tb24 (Metformin hcl) .Marland Kitchen... Take 2 tablet by mouth twice a day    Aspirin 81 Mg Tabs (Aspirin) .Marland Kitchen... Take one tablet once daily    Januvia 100 Mg Tabs (Sitagliptin phosphate) .Marland Kitchen... 1 by mouth once daily    Glimepiride 1 Mg Tabs (Glimepiride) .Marland Kitchen... 1/2 by mouth once daily  Labs Reviewed: Creat: 1.3 (11/30/2009)    Reviewed HgBA1c results: 6.3 (11/30/2009)  6.4 (06/04/2009) stable overall by hx and exam, ok to continue meds/tx as is   Complete Medication List: 1)  Lipitor 40 Mg  Tabs (Atorvastatin calcium) .... Take 1 tablet by mouth once a day 2)  Lisinopril-hydrochlorothiazide 20-12.5 Mg Tabs (Lisinopril-hydrochlorothiazide) .... Take 2 tablet by mouth once a day 3)  Metformin Hcl 500 Mg Tb24 (Metformin hcl) .... Take 2 tablet by mouth twice a day 4)  Aspirin 81 Mg Tabs (Aspirin) .... Take one tablet once daily 5)  Alavert 10 Mg Tabs (Loratadine) .... Take as needed and as directed 6)  Albuterol 90 Mcg/act Aers (Albuterol) .... 2 puffs qid prn 7)  Januvia 100 Mg Tabs (Sitagliptin phosphate) .Marland Kitchen.. 1 by mouth once daily 8)  Glimepiride 1 Mg Tabs (Glimepiride) .... 1/2 by mouth once daily 9)  Niacin 500 Mg Tabs (Niacin) .... 3 by mouth once daily 10)  Freestyle Lite Test Strp (Glucose blood) .... Use asd 1 once daily 11)  Freestyle Lancets Misc (Lancets) .... Use as directed 12)  Atenolol 25 Mg Tabs (Atenolol) .Marland Kitchen.. 1po once daily 13)  Omeprazole 20 Mg Tbec (Omeprazole) .Marland Kitchen.. 1po once daily 14)  Naproxen 500 Mg Tabs (Naproxen) .Marland Kitchen.. 1po two times a day as needed 15)  Oxycodone Hcl 5 Mg Caps (Oxycodone hcl) .Marland Kitchen.. 1-2  by mouth q 6 hrs as needed 16)  Flexeril 5 Mg Tabs (Cyclobenzaprine hcl) .Marland Kitchen.. 1po three times a day as needed  Other Orders: Admin 1st Vaccine (16109) Flu Vaccine 25yrs + (60454)  Patient Instructions: 1)  Please take all new medications as prescribed 2)  Continue all previous medications as before this visit  3)  you had the flu shot today 4)  Please schedule a follow-up appointment in 5 months. with CPX labs and: 5)  HbgA1C prior to visit, ICD-9: 250.02 6)  Urine Microalbumin prior to visit, ICD-9: Prescriptions: FLEXERIL 5 MG TABS (CYCLOBENZAPRINE HCL) 1po three times a day as needed  #50 x 0   Entered and Authorized by:   Corwin Levins MD   Signed by:   Corwin Levins MD on 01/18/2010   Method used:   Print then Give to Patient   RxID:   [id number] OXYCODONE HCL 5 MG CAPS (OXYCODONE HCL) 1-2  by mouth q 6 hrs as needed  #40  x 0   Entered and  Authorized by:   Corwin Levins MD   Signed by:   Corwin Levins MD on 01/18/2010   Method used:   Print then Give to Patient   RxID:   [id number]     Flu Vaccine Consent Questions     Do you have a history of severe allergic reactions to this vaccine? no    Any prior history of allergic reactions to egg and/or gelatin? no    Do you have a sensitivity to the preservative Thimersol? no    Do you have a past history of Guillan-Barre Syndrome? no    Do you currently have an acute febrile illness? no    Have you ever had a severe reaction to latex? no    Vaccine information given and explained to patient? yes    Are you currently pregnant? no    Lot Number:[id number]   Exp Date:11/09/2010   Site Given  Left Deltoid IMlu

## 2010-06-11 NOTE — Assessment & Plan Note (Signed)
Summary: cpx / bcbs / #/cd   Vital Signs:  Patient profile:   55 year old male Height:      72 inches Weight:      307 pounds BMI:     41.79 O2 Sat:      94 % on Room air Temp:     98.1 degrees F oral Pulse rate:   73 / minute BP sitting:   118 / 80  (left arm) Cuff size:   large  Vitals Entered ByZella Ball Ewing (June 06, 2009 8:40 AM)  O2 Flow:  Room air  CC: Adult physical/RE   CC:  Adult physical/RE.  History of Present Illness: overall doing well , atenolol not always working as wel recently with the tremor but tolerable for now;  Pt denies CP, sob, doe, wheezing, orthopnea, pnd, worsening LE edema, palps, dizziness or syncope  Pt denies new neuro symptoms such as headache, facial or extremity weakness  Pt denies polydipsia, polyuria, or low sugar symptoms such as shakiness improved with eating.  Overall good compliance with meds, trying to follow low chol, DM diet, wt stable, little excercise however   Problems Prior to Update: 1)  Gerd  (ICD-530.81) 2)  Familial Tremor  (ICD-333.1) 3)  Rash-nonvesicular  (ICD-782.1) 4)  Onychomycosis, Toenails  (ICD-110.1) 5)  Preventive Health Care  (ICD-V70.0) 6)  Allergic Rhinitis  (ICD-477.9) 7)  Asthma  (ICD-493.90) 8)  Onychomycosis, Toenails  (ICD-110.1) 9)  Family History Diabetes 1st Degree Relative  (ICD-V18.0) 10)  Preventive Health Care  (ICD-V70.0) 11)  Asthma Nos w/o Status Asthmaticus  (ICD-493.90) 12)  Erectile Dysfunction  (ICD-302.72) 13)  Diabetes Mellitus  (ICD-250.00) 14)  Hyperlipidemia  (ICD-272.4) 15)  Hypertension  (ICD-401.9)  Medications Prior to Update: 1)  Lipitor 40 Mg Tabs (Atorvastatin Calcium) .... Take 1 Tablet By Mouth Once A Day 2)  Lisinopril-Hydrochlorothiazide 20-12.5 Mg Tabs (Lisinopril-Hydrochlorothiazide) .... Take 2 Tablet By Mouth Once A Day 3)  Metformin Hcl 500 Mg Tb24 (Metformin Hcl) .... Take 2 Tablet By Mouth Twice A Day Physical Is Due in Sept No Addtional Refills Until Appt 4)   Aspirin 81 Mg  Tabs (Aspirin) .... Take One Tablet Once Daily 5)  Alavert 10 Mg  Tabs (Loratadine) .... Take As Needed and As Directed 6)  Albuterol 90 Mcg/act  Aers (Albuterol) .... 2 Puffs Qid Prn 7)  Levitra 20 Mg  Tabs (Vardenafil Hcl) .Marland Kitchen.. 1 By Mouth Once Daily Prn 8)  Januvia 100 Mg  Tabs (Sitagliptin Phosphate) .Marland Kitchen.. 1 By Mouth Qd 9)  Fluconazole 150 Mg Tabs (Fluconazole) .Marland Kitchen.. 1po Q Wk 10)  Glimepiride 1 Mg  Tabs (Glimepiride) .Marland Kitchen.. 1 By Mouth Qd 11)  Niacin 500 Mg  Tabs (Niacin) .... 3 By Mouth Qd 12)  Freestyle Test  Strp (Glucose Blood) .... Test Blood Sugar Once Daily 13)  Freestyle Lancets  Misc (Lancets) .... Use As Directed 14)  Triamcinolone Acetonide 0.5 % Crea (Triamcinolone Acetonide) .... Use Asd Two Times A Day As Needed 15)  Atenolol 25 Mg Tabs (Atenolol) .Marland Kitchen.. 1po Once Daily 16)  Omeprazole 20 Mg Tbec (Omeprazole) .Marland Kitchen.. 1po Once Daily  Current Medications (verified): 1)  Lipitor 40 Mg Tabs (Atorvastatin Calcium) .... Take 1 Tablet By Mouth Once A Day 2)  Lisinopril-Hydrochlorothiazide 20-12.5 Mg Tabs (Lisinopril-Hydrochlorothiazide) .... Take 2 Tablet By Mouth Once A Day 3)  Metformin Hcl 500 Mg Tb24 (Metformin Hcl) .... Take 2 Tablet By Mouth Twice A Day 4)  Aspirin 81 Mg  Tabs (Aspirin) .Marland KitchenMarland KitchenMarland Kitchen  Take One Tablet Once Daily 5)  Alavert 10 Mg  Tabs (Loratadine) .... Take As Needed and As Directed 6)  Albuterol 90 Mcg/act  Aers (Albuterol) .... 2 Puffs Qid Prn 7)  Januvia 100 Mg  Tabs (Sitagliptin Phosphate) .Marland Kitchen.. 1 By Mouth Once Daily 8)  Glimepiride 1 Mg  Tabs (Glimepiride) .Marland Kitchen.. 1 By Mouth Once Daily 9)  Niacin 500 Mg  Tabs (Niacin) .... 3 By Mouth Once Daily 10)  Freestyle Test  Strp (Glucose Blood) .... Test Blood Sugar Once Daily 11)  Freestyle Lancets  Misc (Lancets) .... Use As Directed 12)  Atenolol 25 Mg Tabs (Atenolol) .Marland Kitchen.. 1po Once Daily 13)  Omeprazole 20 Mg Tbec (Omeprazole) .Marland Kitchen.. 1po Once Daily  Allergies (verified): No Known Drug Allergies  Past  History:  Past Medical History: Last updated: 05/08/2008 ERECTILE DYSFUNCTION (ICD-302.72) DIABETES MELLITUS (ICD-250.00) HYPERLIPIDEMIA (ICD-272.4) HYPERTENSION (ICD-401.9) Asthma Allergic rhinitis GERD essential tremor  Past Surgical History: Last updated: 03/08/2007 no surgical history  Family History: Last updated: 05/08/2008 father with prostate cancer Family History Diabetes 1st degree relative - father, mother and sister Family History Hypertension mother with sleep apnea grandfather with chf great-grandfather with lung cancer muliple with essential tremor  Social History: Last updated: 09/06/2007 Never Smoked Alcohol use-yes - rare Married 2 children 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         all otherwise negative per pt -   Physical Exam  General:  alert and overweight-appearing.   Head:  normocephalic and atraumatic.   Eyes:  vision grossly intact, pupils equal, and pupils round.   Ears:  R ear normal and L ear normal.   Nose:  no external deformity and no nasal discharge.   Mouth:  no gingival abnormalities and pharynx pink and moist.   Neck:  supple and no masses.   Lungs:  normal respiratory effort and normal breath sounds.   Heart:  normal rate and regular rhythm.   Abdomen:  soft, non-tender, and normal bowel sounds.   Msk:  no joint tenderness and no joint swelling.   Extremities:  no edema, no erythema  Neurologic:   cranial nerves II-XII intact and strength normal in all extremities.     Impression & Recommendations:  Problem # 1:  Preventive Health Care (ICD-V70.0)  Overall doing well, age appropriate education and counseling updated and referral for appropriate preventive services done unless declined, immunizations up to date or declined, diet counseling done if overweight, urged to quit smoking if smokes , most recent labs reviewed and current ordered if appropriate, ecg reviewed or declined (interpretation per ECG scanned in the EMR if done); information regarding Medicare Prevention requirements given if appropriate , also to refer to Dr Randa Evens for GI eval for rouitne colon screening  Orders: EKG w/ Interpretation (93000) Gastroenterology Referral (GI)  Complete Medication List: 1)  Lipitor 40 Mg Tabs (Atorvastatin calcium) .... Take 1 tablet by mouth once a day 2)  Lisinopril-hydrochlorothiazide 20-12.5 Mg Tabs (Lisinopril-hydrochlorothiazide) .... Take 2 tablet by mouth once a day 3)  Metformin Hcl 500 Mg Tb24 (Metformin hcl) .... Take 2 tablet by mouth  twice a day 4)  Aspirin 81 Mg Tabs (Aspirin) .... Take one tablet once daily 5)  Alavert 10 Mg Tabs (Loratadine) .... Take as needed and as directed 6)  Albuterol 90 Mcg/act Aers (Albuterol) .... 2 puffs qid prn 7)  Januvia 100 Mg Tabs (Sitagliptin phosphate) .Marland Kitchen.. 1 by mouth once daily 8)  Glimepiride 1 Mg Tabs (Glimepiride) .Marland Kitchen.. 1 by mouth once daily 9)  Niacin 500 Mg Tabs (Niacin) .... 3 by mouth once daily 10)  Freestyle Test Strp (Glucose blood) .... Test blood sugar once daily 11)  Freestyle Lancets Misc (Lancets) .... Use as directed 12)  Atenolol 25 Mg Tabs (Atenolol) .Marland Kitchen.. 1po once daily 13)  Omeprazole 20 Mg Tbec (Omeprazole) .Marland Kitchen.. 1po once daily  Other Orders: Admin 1st Vaccine (36644) Flu Vaccine 73yrs + [phone])  Patient Instructions: 1)  you had the flu shot today 2)  You will be contacted about the referral(s) to: GI  - Dr  Carman Ching 3)  please check if [place] is covered better than [place] with your drug plan 4)  Continue all previous medications as before this visit  5)  Please schedule a follow-up appointment in 6 months with : 6)  BMP prior to visit, ICD-9: 250.02 7)  Lipid Panel prior to visit, ICD-9: 8)  HbgA1C prior to visit, ICD-9: Prescriptions: OMEPRAZOLE 20 MG TBEC (OMEPRAZOLE) 1po once daily  #90 x 3   Entered and Authorized by:   Corwin Levins MD   Signed by:   Corwin Levins MD on 06/06/2009   Method used:   Print then Give to Patient   RxID:   [id number] ATENOLOL 25 MG TABS (ATENOLOL) 1po once daily  #90 x 3   Entered and Authorized by:   Corwin Levins MD   Signed by:   Corwin Levins MD on 06/06/2009   Method used:   Print then Give to Patient   RxID:   [id number] FREESTYLE LANCETS  MISC (LANCETS) use as directed  #100 x 3   Entered and Authorized by:   Corwin Levins MD   Signed by:   Corwin Levins MD on 06/06/2009   Method used:   Print then Give to Patient   RxID:   [id number] FREESTYLE TEST  STRP (GLUCOSE BLOOD) test blood sugar once daily  #100 x 3   Entered and Authorized by:   Corwin Levins MD   Signed by:   Corwin Levins MD on 06/06/2009   Method used:   Print then Give to Patient   RxID:   [id number] NIACIN 500 MG  TABS (NIACIN) 3 by mouth once daily  #270 x 3   Entered and Authorized by:   Corwin Levins MD   Signed by:   Corwin Levins MD on 06/06/2009   Method used:   Print then Give to Patient   RxID:   [id number] GLIMEPIRIDE 1 MG  TABS (GLIMEPIRIDE) 1 by mouth once daily  #90 x 3   Entered and Authorized by:   Corwin Levins MD   Signed by:   Corwin Levins MD on 06/06/2009   Method used:   Print then Give to Patient   RxID:   [id number] JANUVIA 100 MG  TABS (SITAGLIPTIN PHOSPHATE) 1 by mouth once daily  #90 x 3   Entered and Authorized by:   Corwin Levins MD   Signed by:   Corwin Levins MD on 06/06/2009   Method used:  Print then Give to  Patient   RxID:   [id number] ALBUTEROL 90 MCG/ACT  AERS (ALBUTEROL) 2 puffs qid prn  #3 x 3   Entered and Authorized by:   Corwin Levins MD   Signed by:   Corwin Levins MD on 06/06/2009   Method used:   Print then Give to Patient   RxID:   [id number] METFORMIN HCL 500 MG TB24 (METFORMIN HCL) Take 2 tablet by mouth twice a day  #180 x 3   Entered and Authorized by:   Corwin Levins MD   Signed by:   Corwin Levins MD on 06/06/2009   Method used:   Print then Give to Patient   RxID:   [id number] LISINOPRIL-HYDROCHLOROTHIAZIDE 20-12.5 MG TABS (LISINOPRIL-HYDROCHLOROTHIAZIDE) Take 2 tablet by mouth once a day  #180 x 3   Entered and Authorized by:   Corwin Levins MD   Signed by:   Corwin Levins MD on 06/06/2009   Method used:   Print then Give to Patient   RxID:   [id number] LIPITOR 40 MG TABS (ATORVASTATIN CALCIUM) Take 1 tablet by mouth once a day  #90 x 3   Entered and Authorized by:   Corwin Levins MD   Signed by:   Corwin Levins MD on 06/06/2009   Method used:   Print then Give to Patient   RxID:   [id number] JANUVIA 100 MG  TABS (SITAGLIPTIN PHOSPHATE) 1 by mouth once daily  #30 x 0   Entered and Authorized by:   Corwin Levins MD   Signed by:   Corwin Levins MD on 06/06/2009   Method used:   Print then Give to Patient   RxID:   [id number]   Flu Vaccine Consent Questions     Do you have a history of severe allergic reactions to this vaccine? no    Any prior history of allergic reactions to egg and/or gelatin? no    Do you have a sensitivity to the preservative Thimersol? no    Do you have a past history of Guillan-Barre Syndrome? no    Do you currently have an acute febrile illness? no    Have you ever had a severe reaction to latex? no    Vaccine information given and explained to patient? yes    Are you currently pregnant? no    Lot Number:[id number]   Exp Date:11/08/2009   Site Given  Left Deltoid IMlbflu

## 2010-06-11 NOTE — Miscellaneous (Signed)
Summary: Orders Update  Clinical Lists Changes  Problems: Added new problem of TREMOR (ICD-781.0) Orders: Added new Referral order of Neurology Referral (Neuro) - Signed

## 2010-06-11 NOTE — Progress Notes (Signed)
Summary: oxycodone  Phone Note Call from Patient Call back at Home Phone [phone] Message from:  Patient  Refills Requested: Medication #1:  OXYCODONE HCL 5 MG CAPS 1-2  by mouth q 6 hrs as needed Initial call taken by: Orlan Leavens RMA,  January 22, 2010 10:04 AM Caller: Patient Reason for Call: Refill Medication Summary of Call: Pt called requesting refill on oxycodone that was rx last Friday. Pt states he only has 4 pills left. Initial call taken by: Orlan Leavens RMA,  January 22, 2010 10:05 AM  Follow-up for Phone Call        called pt no ansew [facility] rx ready for pick-up. Put in cabinet up front Follow-up by: Orlan Leavens RMA,  January 22, 2010 1:34 PM    New/Updated Medications: OXYCODONE HCL 5 MG CAPS (OXYCODONE HCL) 1-2  by mouth q 6 hrs as needed - Prescriptions: OXYCODONE HCL 5 MG CAPS (OXYCODONE HCL) 1-2  by mouth q 6 hrs as needed -  #40 x 0   Entered and Authorized by:   Corwin Levins MD   Signed by:   Corwin Levins MD on 01/22/2010   Method used:   Print then Give to Patient   RxID:   [id number]  done hardcopy to LIM side B - dahlia Corwin Levins MD  January 22, 2010 1:21 PM

## 2010-06-11 NOTE — Letter (Signed)
Summary: [facility]   Imported By: Sherian Rein 03/13/2010 15:13:10  _____________________________________________________________________  External Attachment:    Type:   Image     Comment:   External Document

## 2010-06-11 NOTE — Letter (Signed)
Summary: [facility]  [facility]   Imported By: Lennie Odor 03/19/2010 10:05:02  _____________________________________________________________________  External Attachment:    Type:   Image     Comment:   External Document

## 2010-06-11 NOTE — Progress Notes (Signed)
Summary: Rx request  Phone Note Call from Patient Call back at Home Phone [phone]   Caller: Patient Summary of Call: pt called to ask why Rx for Glimiperide was denied at [facility]. I will send refill to [facility], last Rx was 05/21/2008 30x10. Initial call taken by: Margaret Pyle, CMA,  May 28, 2009 3:13 PM    Prescriptions: GLIMEPIRIDE 1 MG  TABS (GLIMEPIRIDE) 1 by mouth qd  #30 Tablet x 11   Entered by:   Margaret Pyle, CMA   Authorized by:   Corwin Levins MD   Signed by:   Margaret Pyle, CMA on 05/28/2009   Method used:   Electronically to        [facility]       [address]       [place], [place]  [address]       Ph: [phone]       Fax: [phone]   RxID:   [id number]

## 2010-06-11 NOTE — Assessment & Plan Note (Signed)
Summary: F/U SLEEP/RJC   Visit Type:  Follow-up Primary Provider/Referring Provider:  Corwin Levins MD  CC:  Post sleep study visit. Pt states night of sleep study was not a "normal" night for him. Pt is scheduled for ACDF surgery Nov 28 and 2011 .  History of Present Illness: 55/M, obese diabetic, hypertensive for evaluation of obstructive sleep apnea.  Wife prompted referral due to heavy snoring & witnessed apneas. Epworth Sleepiness Score 5/24  bedtime 10 pm ,sleep latency minimal, 1-2 awakenings, denies nocturia, wakes up at 0600 tired, sleps on his side with 2 pillows,  on weekends , sleeps until 0800 - never fully  rested, no dry mouth, no headaches. Drinks iced tead & soda 20oz/day, no coffee, has gained 25 lbs over last 2 years.  March 13, 2010 3:03 PM  Did not tolerate CPAP machine due to claustrophobia , could not sleep on his back, did not like pillows  - sinus congestion. PSG showed severe obstructive sleep apnea with AHI 62/h , regardless of body position, mostly prone position scheduled for ACDF surgery Apr 08 2010 . There is no history suggestive of cataplexy, sleep paralysis or parasomnias   Preventive Screening-Counseling & Management  Alcohol-Tobacco     Smoking Status: never  Current Medications (verified): 1)  Lipitor 40 Mg Tabs (Atorvastatin Calcium) .... Take 1 Tablet By Mouth Once A Day 2)  Lisinopril-Hydrochlorothiazide 20-12.5 Mg Tabs (Lisinopril-Hydrochlorothiazide) .... Take 2 Tablet By Mouth Once A Day 3)  Metformin Hcl 500 Mg Tb24 (Metformin Hcl) .... Take 2 Tablet By Mouth Twice A Day 4)  Aspirin 81 Mg  Tabs (Aspirin) .... Take One Tablet Once Daily 5)  Alavert 10 Mg  Tabs (Loratadine) .... Take As Needed and As Directed 6)  Albuterol 90 Mcg/act  Aers (Albuterol) .... 2 Puffs Qid Prn 7)  Januvia 100 Mg  Tabs (Sitagliptin Phosphate) .Marland Kitchen.. 1 By Mouth Once Daily 8)  Glimepiride 1 Mg  Tabs (Glimepiride) .... 1/2 By Mouth Once Daily 9)  Niacin 500 Mg  Tabs  (Niacin) .... 3 By Mouth Once Daily 10)  Freestyle Lite Test  Strp (Glucose Blood) .... Use Asd 1 Once Daily 11)  Freestyle Lancets  Misc (Lancets) .... Use As Directed 12)  Atenolol 25 Mg Tabs (Atenolol) .Marland Kitchen.. 1po Once Daily 13)  Omeprazole 20 Mg Tbec (Omeprazole) .Marland Kitchen.. 1po Once Daily 14)  Neurontin 300 Mg Caps (Gabapentin) .... Take 1 Tablet By Mouth Once A Day X 1 Week and Increase To Take 1 Tablet By Mouth Three Times A Day  Allergies (verified): No Known Drug Allergies  Past History:  Past Medical History: Last updated: 12/12/2009 ERECTILE DYSFUNCTION (ICD-302.72) DIABETES MELLITUS (ICD-250.00) HYPERLIPIDEMIA (ICD-272.4) HYPERTENSION (ICD-401.9) Asthma Allergic rhinitis GERD essential tremor Diverticulosis, colon  Social History: Last updated: 09/06/2007 Never Smoked Alcohol use-yes - rare Married 2 children work - Psychiatrist - in Research scientist (medical)  Review of Systems  The patient denies anorexia, fever, weight loss, weight gain, vision loss, decreased hearing, hoarseness, chest pain, syncope, dyspnea on exertion, peripheral edema, prolonged cough, headaches, hemoptysis, abdominal pain, melena, hematochezia, severe indigestion/heartburn, hematuria, muscle weakness, suspicious skin lesions, difficulty walking, depression, unusual weight change, abnormal bleeding, enlarged lymph nodes, and angioedema.    Vital Signs:  Patient profile:   55 year old male Height:      72 inches Weight:      318.4 pounds BMI:     43.34 O2 Sat:      95 % on Room air Temp:  99.3 degrees F oral Pulse rate:   73 / minute BP sitting:   110 / 82  (left arm) Cuff size:   regular  Vitals Entered By: Zackery Barefoot CMA (March 13, 2010 2:49 PM)  O2 Flow:  Room air CC: Post sleep study visit. Pt states night of sleep study was not a "normal" night for him. Pt is scheduled for ACDF surgery Apr 08, 2010  Comments Medications reviewed with patient Verified contact number and pharmacy with patient Zackery Barefoot CMA  March 13, 2010 2:50 PM    Physical Exam  Additional Exam:  wt 318 January 25, 2010  Gen. Pleasant, obese, in no distress, normal affect ENT - no lesions, no post nasal drip, class 3 airway Neck: No JVD, no thyromegaly, no carotid bruits Lungs: no use of accessory muscles, no dullness to percussion, clear without rales or rhonchi  Cardiovascular: Rhythm regular, heart sounds  normal, no murmurs or gallops, no peripheral edema Musculoskeletal: No deformities, no cyanosis or clubbing      Impression & Recommendations:  Problem # 1:  OBSTRUCTIVE SLEEP APNEA (ICD-780.57) The pathophysiology of obstructive sleep apnea, it's cardiovascular consequences and modes of treatment including CPAP were discussed with the patient in great detail.  Start autoCPAP 8-15, humidity, resmed medium mirage mask, download in 4 weeks  Compliance encouraged, wt loss emphasized, asked to avoid meds with sedative side effects, cautioned against driving when sleepy.  Use CPAP when recovering from anesthesia & while on painmeds - including hospital stay  Orders: DME Referral (DME) Est. Patient Level III (50093)  Medications Added to Medication List This Visit: 1)  Neurontin 300 Mg Caps (Gabapentin) .... Take 1 tablet by mouth once a day x 1 week and increase to take 1 tablet by mouth three times a day  Patient Instructions: 1)  Copy sent to:dr john 2)  Please schedule a follow-up appointment in 1 month. 3)  CPAP trial - turn in chip so we can look at download     Appended Document: F/U SLEEP/RJC pl find out name of surgeon & fax to them  Appended Document: F/U SLEEP/RJC Donalee Citrin [facility]  Appended Document: F/U SLEEP/RJC biscom faxed to above. jwr

## 2010-06-13 NOTE — Assessment & Plan Note (Addendum)
Summary: 4 WK F/U OSA/RJC   Visit Type:  Follow-up Primary Provider/Referring Provider:  Corwin Levins MD  CC:  4 week follow up. Pt recovering from neck surgery Nov 28 and 2011. Pt states has not been using CPAP since surgery.  History of Present Illness: Joseph/M, obese diabetic, hypertensive for FU of severe obstructive sleep apnea.   PSG showed severe obstructive sleep apnea with AHI 62/h , regardless of body position,sleeps prone Drinks iced tead & soda 20 oz/day, no coffee, has gained 25 lbs over last 2 years.  April 19, 2010 2:52 PM  - 2nd post cpap visit, set up nov 7/11 Had adjusted to nasal pillows, pressure ok. was using 4 h/ night underwent ACDF surgery Apr 08 2010 (cram), now in soft c-collar. Used cpap in th [facility]ospital, not able to use it now due to collar. On flexeril & vicodin   Preventive Screening-Counseling & Management  Alcohol-Tobacco     Smoking Status: never  Current Medications (verified): 1)  Lipitor 40 Mg Tabs (Atorvastatin Calcium) .... Take 1 Tablet By Mouth Once A Day 2)  Lisinopril-Hydrochlorothiazide 20-12.5 Mg Tabs (Lisinopril-Hydrochlorothiazide) .... Take 2 Tablet By Mouth Once A Day 3)  Metformin Hcl 500 Mg Tb24 (Metformin Hcl) .... Take 2 Tablet By Mouth Twice A Day 4)  Aspirin 81 Mg  Tabs (Aspirin) .... Take One Tablet Once Daily 5)  Alavert 10 Mg  Tabs (Loratadine) .... Take As Needed and As Directed 6)  Albuterol 90 Mcg/act  Aers (Albuterol) .... 2 Puffs Qid Prn 7)  Januvia 100 Mg  Tabs (Sitagliptin Phosphate) .Marland Kitchen.. 1 By Mouth Once Daily 8)  Glimepiride 1 Mg  Tabs (Glimepiride) .... 1/2 By Mouth Once Daily 9)  Niacin 500 Mg  Tabs (Niacin) .... 3 By Mouth Once Daily 10)  Freestyle Lite Test  Strp (Glucose Blood) .... Use Asd 1 Once Daily 11)  Freestyle Lancets  Misc (Lancets) .... Use As Directed 12)  Atenolol 25 Mg Tabs (Atenolol) .Marland Kitchen.. 1po Once Daily 13)  Omeprazole 20 Mg Tbec (Omeprazole) .Marland Kitchen.. 1po Once Daily 14)  Neurontin 300 Mg Caps  (Gabapentin) .... Take 1 Tablet By Mouth Three Times A Day 15)  Flexeril 5 Mg Tabs (Cyclobenzaprine Hcl) .... Take 1 Tablet By Mouth Three Times A Day 16)  Hydrocodone-Acetaminophen 5-500 Mg Tabs (Hydrocodone-Acetaminophen) .... Take 2 Tablet By Mouth Three Times A Day 17)  Cpap .... 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    Vital Signs:  Patient profile:   Joseph Maxwell Height:      72 inches Weight:      318.8 pounds BMI:     43.39 O2 Sat:      95 % on Room air Temp:     98.3 degrees F oral Pulse rate:   78 /  minute BP sitting:   126 / 80  (right arm) Cuff size:   large  Vitals Entered By: Zackery Barefoot CMA (April 19, 2010 2:11 PM)  O2 Flow:  Room air CC: 4 week follow up. Pt recovering from neck surgery Apr 08, 2010. Pt states has not been using CPAP since surgery Comments Medications reviewed with patient Verified contact number and pharmacy with patient Zackery Barefoot [facility]  April 19, 2010 2:11 PM    Physical Exam  Additional Exam:  wt 318  January 25, 2010 >> 318 April 20, 2010  Gen. Pleasant, obese, in no distress, normal affect ENT - no lesions, no post nasal drip, class 3 airway Neck: No JVD, no thyromegaly, no carotid bruits Lungs: no use of accessory muscles, no dullness to percussion, clear without rales or rhonchi  Cardiovascular: Rhythm regular, heart sounds  normal, no murmurs or gallops, no peripheral edema Musculoskeletal: No deformities, no cyanosis or clubbing      Impression & Recommendations:  Problem # 1:  OBSTRUCTIVE SLEEP APNEA (ICD-780.57) Emphasised that he must get back on CPAP esp since he is now taking narcotics & muscle relaxants, concern that obstructive sleep apnea is worse Compliance encouraged, wt loss emphasized, asked to avoid meds with sedative side effects, cautioned against driving when sleepy.  chk download Orders: Est. Patient Level III (82956) DME Referral (DME)  Medications Added to Medication List This Visit: 1)  Neurontin 300 Mg Caps (Gabapentin) .... Take 1 tablet by mouth three times a day 2)  Flexeril 5 Mg Tabs (Cyclobenzaprine hcl) .... Take 1 tablet by mouth three times a day 3)  Hydrocodone-acetaminophen 5-500 Mg Tabs (Hydrocodone-acetaminophen) .... Take 2 tablet by mouth three times a day 4)  Cpap  .... Ahc  Patient Instructions: 1)  Copy sent to:dr john 2)  Please schedule a follow-up appointment in 2 months. 3)   We will chk download & adjust settings as needed     Appended Document: 4 WK F/U OSA/RJC download 11/7-27/11 >> avg pr 13 cm, no residual events, good compliance  Appended Document: 4 WK F/U OSA/RJC ATC, multiple rings, no answer.  Appended Document: 4 WK F/U OSA/RJC lmomtcb x 1.  Appended Document: 4 WK F/U OSA/RJC download 12/21 -05/28/10  on autoCPAP >> 77% compliance, no residula events, no leak   Appended Document: 4 WK F/U OSA/RJC pt states he is tolerating CPAP, not snoring, and feels rested when wakes. Had surgery on neck in Nov and was  unable to use. Used hospital machine w/o ramp and was unable to tolerate it. States ramps helps.

## 2010-06-19 NOTE — Assessment & Plan Note (Signed)
Summary: CPX/#/CD   Vital Signs:  Patient profile:   55 year old male Height:      71 inches Weight:      312 pounds BMI:     43.67 O2 Sat:      98 % on Room air Temp:     98.3 degrees F oral Pulse rate:   75 / minute BP sitting:   122 / 70  (left arm) Cuff size:   large  Vitals Entered By: Zella Ball Ewing CMA Duncan Dull) (June 10, 2010 8:19 AM)  O2 Flow:  Room air  CC: Adult Physical/RE   Primary Care Provider:  Corwin Levins MD  CC:  Adult Physical/RE.  History of Present Illness: here to f/u;  overall doing well.  Pt denies CP, worsening sob, doe, wheezing, orthopnea, pnd, worsening LE edema, palps, dizziness or syncope  Pt denies new neuro symptoms such as headache, facial or extremity weakness  Pt denies polydipsia, polyuria, or low sugar symptoms such as shakiness improved with eating.  Overall good compliance with meds, trying to follow low chol, DM diet, wt stable, little excercise however and sugas most recently in the loa 100's and even 90s in the am.  Denies worsening depressive symptoms, suicidal ideation, or panic.   Overall good compliance with meds, and good tolerability. No fever, wt loss, night sweats, loss of appetite or other constitutional symptoms  Pt states good ability with ADL's, low fall risk, home safety reviewed and adequate, no significant change in hearing or vision, trying to follow lower chol diet, and occasionally active only with regular excercise.  Needs for m filled out for [employer] for [employer] activity today  Preventive Screening-Counseling & Management      Drug Use:  no.    Problems Prior to Update: 1)  Obstructive Sleep Apnea  (ICD-780.57) 2)  Back Pain  (ICD-724.5) 3)  Tremor  (ICD-781.0) 4)  Hypersomnia  (ICD-780.54) 5)  Plantar Fasciitis, Left  (ICD-728.71) 6)  Diverticulosis, Colon  (ICD-562.10) 7)  Gerd  (ICD-530.81) 8)  Familial Tremor  (ICD-333.1) 9)  Rash-nonvesicular  (ICD-782.1) 10)  Onychomycosis, Toenails  (ICD-110.1) 11)  Preventive  Health Care  (ICD-V70.0) 12)  Allergic Rhinitis  (ICD-477.9) 13)  Asthma  (ICD-493.90) 14)  Onychomycosis, Toenails  (ICD-110.1) 15)  Family History Diabetes 1st Degree Relative  (ICD-V18.0) 16)  Preventive Health Care  (ICD-V70.0) 17)  Asthma Nos w/o Status Asthmaticus  (ICD-493.90) 18)  Erectile Dysfunction  (ICD-302.72) 19)  Diabetes Mellitus  (ICD-250.00) 20)  Hyperlipidemia  (ICD-272.4) 21)  Hypertension  (ICD-401.9)  Medications Prior to Update: 1)  Lipitor 40 Mg Tabs (Atorvastatin Calcium) .... Take 1 Tablet By Mouth Once A Day 2)  Lisinopril-Hydrochlorothiazide 20-12.5 Mg Tabs (Lisinopril-Hydrochlorothiazide) .... Take 2 Tablet By Mouth Once A Day 3)  Metformin Hcl 500 Mg Tb24 (Metformin Hcl) .... Take 2 Tablet By Mouth Twice A Day 4)  Aspirin 81 Mg  Tabs (Aspirin) .... Take One Tablet Once Daily 5)  Alavert 10 Mg  Tabs (Loratadine) .... Take As Needed and As Directed 6)  Albuterol 90 Mcg/act  Aers (Albuterol) .... 2 Puffs Qid Prn 7)  Januvia 100 Mg  Tabs (Sitagliptin Phosphate) .Marland Kitchen.. 1 By Mouth Once Daily 8)  Glimepiride 1 Mg  Tabs (Glimepiride) .... 1/2 By Mouth Once Daily 9)  Niacin 500 Mg  Tabs (Niacin) .... 3 By Mouth Once Daily 10)  Freestyle Lite Test  Strp (Glucose Blood) .... Use Asd 1 Once Daily 11)  Freestyle Lancets  Misc (Lancets) .... Use As Directed 12)  Atenolol 25 Mg Tabs (Atenolol) .Marland Kitchen.. 1po Once Daily 13)  Omeprazole 20 Mg Tbec (Omeprazole) .Marland Kitchen.. 1po Once Daily 14)  Neurontin 300 Mg Caps (Gabapentin) .... Take 1 Tablet By Mouth Three Times A Day 15)  Flexeril 5 Mg Tabs (Cyclobenzaprine Hcl) .... Take 1 Tablet By Mouth Three Times A Day 16)  Hydrocodone-Acetaminophen 5-500 Mg Tabs (Hydrocodone-Acetaminophen) .... Take 2 Tablet By Mouth Three Times A Day 17)  Cpap .... Ahc  Current Medications (verified): 1)  Lipitor 40 Mg Tabs (Atorvastatin Calcium) .... Take 1 Tablet By Mouth Once A Day 2)  Lisinopril-Hydrochlorothiazide 20-12.5 Mg Tabs  (Lisinopril-Hydrochlorothiazide) .... Take 2 Tablet By Mouth Once A Day 3)  Metformin Hcl 500 Mg Tb24 (Metformin Hcl) .... Take 2 Tablet By Mouth Twice A Day 4)  Aspirin 81 Mg  Tabs (Aspirin) .... Take One Tablet Once Daily 5)  Alavert 10 Mg  Tabs (Loratadine) .... Take As Needed and As Directed 6)  Albuterol 90 Mcg/act  Aers (Albuterol) .... 2 Puffs Qid Prn 7)  Januvia 100 Mg  Tabs (Sitagliptin Phosphate) .Marland Kitchen.. 1 By Mouth Once Daily 8)  Glimepiride 1 Mg  Tabs (Glimepiride) .... 1/2 By Mouth Once Daily 9)  Niacin 500 Mg  Tabs (Niacin) .... 3 By Mouth Once Daily 10)  Freestyle Lite Test  Strp (Glucose Blood) .... Use Asd 1 Once Daily 11)  Freestyle Lancets  Misc (Lancets) .... Use As Directed 12)  Atenolol 25 Mg Tabs (Atenolol) .Marland Kitchen.. 1po Once Daily 13)  Omeprazole 20 Mg Tbec (Omeprazole) .Marland Kitchen.. 1po Once Daily 14)  Neurontin 300 Mg Caps (Gabapentin) .... Take 1 Tablet By Mouth Three Times A Day 15)  Flexeril 5 Mg Tabs (Cyclobenzaprine Hcl) .... Take 1 Tablet By Mouth Three Times A Day 16)  Hydrocodone-Acetaminophen 5-500 Mg Tabs (Hydrocodone-Acetaminophen) .... Take 2 Tablet By Mouth Three Times A Day 17)  Cpap .... 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         all otherwise negative per pt -    Physical Exam  General:  alert and overweight-appearing.   Head:  normocephalic and atraumatic.   Eyes:  vision grossly intact, pupils equal, and pupils round.   Ears:  R ear normal and L ear normal.   Nose:  no external deformity and  no nasal discharge.   Mouth:  no gingival abnormalities and pharynx pink and moist.   Neck:  supple and no masses.   Lungs:  normal respiratory effort and normal breath sounds.   Heart:  normal rate and regular rhythm.   Abdomen:  soft, non-tender, and normal bowel sounds.   Msk:  no joint tenderness and no joint swelling.   Extremities:  no edema, no erythema  Neurologic:  strength normal in all extremities and gait normal.   Skin:  color normal and no rashes.   Psych:  not anxious appearing and not depressed appearing.     Impression & Recommendations:  Problem # 1:  Preventive Health Care (ICD-V70.0) Overall doing well, age appropriate education and counseling updated, referral for preventive services and immunizations addressed, dietary counseling and smoking status adressed , most recent labs reviewed, ecg reviewed I have personally reviewed and have noted 1.The patient's medical and social history 2.Their use of  alcohol, tobacco or illicit drugs 3.Their current medications and supplements 4. Functional ability including ADL's, fall risk, home safety risk, hearing & visual impairment 5.Diet and physical activities 6.Evidence for depression or mood disorders The patients weight, height, BMI  have been recorded in the chart I have made referrals, counseling and provided education to the patient based review of the above  Orders: EKG w/ Interpretation (93000)  Problem # 2:  HYPERTENSION (ICD-401.9)  His updated medication list for this problem includes:    Lisinopril-hydrochlorothiazide 20-12.5 Mg Tabs (Lisinopril-hydrochlorothiazide) .Marland Kitchen... Take 2 tablet by mouth once a day    Atenolol 25 Mg Tabs (Atenolol) .Marland Kitchen... 1po once daily  BP today: 122/70 Prior BP: 126/80 (04/19/2010)  Labs Reviewed: K+: 4.9 (06/04/2010) Creat: : 1.0 (06/04/2010)   Chol: 175 (06/04/2010)   HDL: 31.60 (06/04/2010)   LDL: DEL (05/01/2008)   TG: 389.0 (06/04/2010) stable overall by hx and exam, ok to continue meds/tx as is  Problem # 3:  HYPERLIPIDEMIA (ICD-272.4)  His updated medication list for this problem includes:    Lipitor 40 Mg Tabs (Atorvastatin calcium) .Marland Kitchen... Take 1 tablet by mouth once a day    Niacin 500 Mg Tabs (Niacin) .Marland KitchenMarland KitchenMarland KitchenMarland Kitchen 3 by mouth once daily  Labs Reviewed: SGOT: 24 (06/04/2010)   SGPT: 36 (06/04/2010)   HDL:31.60 (06/04/2010), 31.40 (11/30/2009)  LDL:DEL (05/01/2008), DEL (09/01/2007)  Chol:175 (06/04/2010), 176 (11/30/2009)  Trig:389.0 (06/04/2010), 280.0 (11/30/2009) stable overall by hx and exam, ok to continue meds/tx as is   Problem # 4:  DIABETES MELLITUS (ICD-250.00)  His updated medication list for this problem includes:    Lisinopril-hydrochlorothiazide 20-12.5 Mg Tabs (Lisinopril-hydrochlorothiazide) .Marland Kitchen... Take 2 tablet by mouth once a day    Metformin Hcl 500 Mg Tb24 (Metformin hcl) .Marland Kitchen... Take 2 tablet by mouth twice a day    Aspirin 81 Mg Tabs (Aspirin) .Marland Kitchen... Take one tablet once  daily    Januvia 100 Mg Tabs (Sitagliptin phosphate) .Marland Kitchen... 1 by mouth once daily    Glimepiride 1 Mg Tabs (Glimepiride) .Marland Kitchen... 1/2 by mouth once daily  Labs Reviewed: Creat: 1.0 (06/04/2010)    Reviewed HgBA1c results: 7.4 (06/04/2010)  6.3 (11/30/2009)  mild elev this time due to cutting back on the glimeparide last visit, some dietary indiscretion over the holidays, and less active post surgur nov 28, but plans not to be moare active, Pt to cont DM diet, excercise, wt control efforts; to check labs next visit  Complete Medication List: 1)  Lipitor 40 Mg Tabs (Atorvastatin calcium) .... Take 1 tablet by  mouth once a day 2)  Lisinopril-hydrochlorothiazide 20-12.5 Mg Tabs (Lisinopril-hydrochlorothiazide) .... Take 2 tablet by mouth once a day 3)  Metformin Hcl 500 Mg Tb24 (Metformin hcl) .... Take 2 tablet by mouth twice a day 4)  Aspirin 81 Mg Tabs (Aspirin) .... Take one tablet once daily 5)  Alavert 10 Mg Tabs (Loratadine) .... Take as needed and as directed 6)  Albuterol 90 Mcg/act Aers (Albuterol) .... 2 puffs qid prn 7)  Januvia 100 Mg Tabs (Sitagliptin phosphate) .Marland Kitchen.. 1 by mouth once daily 8)  Glimepiride 1 Mg Tabs (Glimepiride) .... 1/2 by mouth once daily 9)  Niacin 500 Mg Tabs (Niacin) .... 3 by mouth once daily 10)  Freestyle Lite Test Strp (Glucose blood) .... Use asd 1 once daily 11)  Freestyle Lancets Misc (Lancets) .... Use as directed 12)  Atenolol 25 Mg Tabs (Atenolol) .Marland Kitchen.. 1po once daily 13)  Omeprazole 20 Mg Tbec (Omeprazole) .Marland Kitchen.. 1po once daily 14)  Neurontin 300 Mg Caps (Gabapentin) .... Take 1 tablet by mouth three times a day 15)  Flexeril 5 Mg Tabs (Cyclobenzaprine hcl) .... Take 1 tablet by mouth three times a day 16)  Hydrocodone-acetaminophen 5-500 Mg Tabs (Hydrocodone-acetaminophen) .... Take 2 tablet by mouth three times a day 17)  Cpap  .... Ahc  Other Orders: Pneumococcal Vaccine (40981) Admin 1st Vaccine (19147)  Patient Instructions: 1)  you had the  pneumonia shot today 2)  Continue all previous medications as before this visit 3)  Your form was filled out today 4)  All refills were sent to [facility] 5)  Please schedule a follow-up appointment in 6 mo: 6)  BMP prior to visit, ICD-9: 250.02 7)  Lipid Panel prior to visit, ICD-9: 8)  HbgA1C prior to visit, ICD-9: Prescriptions: OMEPRAZOLE 20 MG TBEC (OMEPRAZOLE) 1po once daily  #90 x 3   Entered and Authorized by:   Corwin Levins MD   Signed by:   Corwin Levins MD on 06/10/2010   Method used:   Electronically to        [facility] (retail)       [address]       [place], [place]  [address]       Ph: [phone]       Fax: [phone]   RxID:   [id number] ATENOLOL 25 MG TABS (ATENOLOL) 1po once daily  #90 x 3   Entered and Authorized by:   Corwin Levins MD   Signed by:   Corwin Levins MD on 06/10/2010   Method used:   Electronically to        [facility] (retail)       [address]       [place], [place]  [address]       Ph: [phone]       Fax: [phone]   RxID:   [id number] FREESTYLE LANCETS  MISC (LANCETS) use as directed  #100 x 3   Entered and Authorized by:   Corwin Levins MD   Signed by:   Corwin Levins MD on 06/10/2010   Method used:   Electronically to        [facility] (retail)       [address]       [place], [place]  [address]  Ph: [phone]       Fax: [phone]   RxID:   [id number] FREESTYLE LITE TEST  STRP (GLUCOSE BLOOD) use asd 1 once daily  #100 x 3   Entered and Authorized by:   Corwin Levins MD   Signed by:   Corwin Levins MD on 06/10/2010   Method used:   Electronically to        [facility] (retail)       [address]       [place], [place]  [address]       Ph: [phone]       Fax: [phone]   RxID:   [id number] NIACIN 500 MG  TABS (NIACIN) 3 by mouth once daily   #270 x 3   Entered and Authorized by:   Corwin Levins MD   Signed by:   Corwin Levins MD on 06/10/2010   Method used:   Electronically to        [facility] (retail)       [address]       [place], [place]  [address]       Ph: [phone]       Fax: [phone]   RxID:   [id number] GLIMEPIRIDE 1 MG  TABS (GLIMEPIRIDE) 1/2 by mouth once daily  #45 x 3   Entered and Authorized by:   Corwin Levins MD   Signed by:   Corwin Levins MD on 06/10/2010   Method used:   Electronically to        [facility] (retail)       [address]       [place], [place]  [address]       Ph: [phone]       Fax: [phone]   RxID:   [id number] JANUVIA 100 MG  TABS (SITAGLIPTIN PHOSPHATE) 1 by mouth once daily  #90 x 3   Entered and Authorized by:   Corwin Levins MD   Signed by:   Corwin Levins MD on 06/10/2010   Method used:   Electronically to        [facility] (retail)       [address]       [place], [place]  [address]       Ph: [phone]       Fax: [phone]   RxID:   [id number] ALBUTEROL 90 MCG/ACT  AERS (ALBUTEROL) 2 puffs qid prn  #3 x 3   Entered and Authorized by:   Corwin Levins MD   Signed by:   Corwin Levins MD on 06/10/2010   Method used:   Electronically to        [facility] (retail)       [address]       [place], [place]  [address]       Ph: [phone]       Fax: [phone]   RxID:   [id number] METFORMIN HCL 500 MG TB24 (METFORMIN HCL) Take 2 tablet by mouth twice a day  #180 x 3   Entered and Authorized by:   Corwin Levins MD   Signed by:   Corwin Levins MD on  06/10/2010   Method used:   Electronically to        [facility] (retail)       [address]       [place], [place]  [address]       Ph: [phone]       Fax: [phone]   RxID:    [id number] LISINOPRIL-HYDROCHLOROTHIAZIDE 20-12.5 MG TABS (LISINOPRIL-HYDROCHLOROTHIAZIDE) Take 2 tablet by mouth once a day  #180 x 3   Entered and Authorized by:   Corwin Levins MD   Signed by:   Corwin Levins MD on 06/10/2010   Method used:   Electronically to        [facility] (retail)       [address]       [place], [place]  [address]       Ph: [phone]       Fax: [phone]   RxID:   [id number] LIPITOR 40 MG TABS (ATORVASTATIN CALCIUM) Take 1 tablet by mouth once a day  #90 x 3   Entered and Authorized by:   Corwin Levins MD   Signed by:   Corwin Levins MD on 06/10/2010   Method used:   Electronically to        [facility] (retail)       [address]       [place], [place]  [address]       Ph: [phone]       Fax: [phone]   RxID:   [id number]    Orders Added: 1)  EKG w/ Interpretation [93000] 2)  Pneumococcal Vaccine [90732] 3)  Admin 1st Vaccine [90471] 4)  Est. Patient 40-64 years [81829]   Immunizations Administered:  Pneumonia Vaccine:    Vaccine Type: Pneumovax    Site: left deltoid    Mfr: Merck    Dose: 0.5 ml    Route: IM    Given by: Zella Ball Ewing CMA (AAMA)    Exp. Date: 10/04/2011    Lot #: [id number]    VIS given: 04/16/09 version given June 10, 2010.   Immunizations Administered:  Pneumonia Vaccine:    Vaccine Type: Pneumovax    Site: left deltoid    Mfr: Merck    Dose: 0.5 ml    Route: IM    Given by: Zella Ball Ewing CMA (AAMA)    Exp. Date: 10/04/2011    Lot #: [id number]    VIS given: 04/16/09 version given June 10, 2010.

## 2010-06-27 NOTE — Letter (Signed)
Summary: Physical Exam Form / [employer]  Physical Exam Form / [employer]   Imported By: Lennie Odor 06/21/2010 09:33:11  _____________________________________________________________________  External Attachment:    Type:   Image     Comment:   External Document

## 2010-07-04 ENCOUNTER — Other Ambulatory Visit: Payer: Self-pay | Admitting: Neurosurgery

## 2010-07-04 ENCOUNTER — Encounter: Payer: Self-pay | Admitting: Internal Medicine

## 2010-07-04 ENCOUNTER — Ambulatory Visit
Admission: RE | Admit: 2010-07-04 | Discharge: 2010-07-04 | Disposition: A | Discharge status: Home / Self Care | Payer: 59 | Source: Ambulatory Visit | Attending: Neurosurgery | Admitting: Neurosurgery

## 2010-07-04 DIAGNOSIS — Z9889 Other specified postprocedural states: Secondary | ICD-10-CM

## 2010-07-23 LAB — GLUCOSE, CAPILLARY
Glucose-Capillary: 173 mg/dL — ABNORMAL HIGH (ref 70–99)
Glucose-Capillary: 236 mg/dL — ABNORMAL HIGH (ref 70–99)
Glucose-Capillary: 248 mg/dL — ABNORMAL HIGH (ref 70–99)

## 2010-07-23 LAB — BASIC METABOLIC PANEL
CO2: 28 mEq/L (ref 19–32)
Chloride: 105 mEq/L (ref 96–112)
Creatinine, Ser: 1.21 mg/dL (ref 0.4–1.5)
GFR calc Af Amer: >60 mL/min (ref >60)
Potassium: 5.1 mEq/L (ref 3.5–5.1)
Sodium: 140 mEq/L (ref 135–145)

## 2010-07-23 LAB — CBC
Hemoglobin: 13 g/dL (ref 13.0–17.0)
MCH: 28.6 pg (ref 26.0–34.0)
MCV: 83.1 fL (ref 78.0–100.0)
Platelets: 157 10*3/uL (ref 150–400)
RBC: 4.55 MIL/uL (ref 4.22–5.81)
WBC: 9.7 10*3/uL (ref 4.0–10.5)

## 2010-07-23 NOTE — Letter (Signed)
Summary: [facility]   Imported By: Sherian Rein 07/15/2010 10:55:13  _____________________________________________________________________  External Attachment:    Type:   Image     Comment:   External Document

## 2010-09-27 NOTE — Assessment & Plan Note (Signed)
[facility]                                   ON-CALL NOTE   MADSEN, RIDDLE                        MRN:          [id number]  DATE:02/08/2006                            DOB:          [birth year]    DATE AND TIME:  February 08, 2006 at 12:23 p.m.   TELEPHONE NUMBER:  [phone].   REGULAR PHYSICIAN:  Dr. Jonny Ruiz.   CHIEF COMPLAINT:  Cough.   HISTORY:  The patient states he has had a persistent cough.  He was seen on  Wednesday for a physical with Dr. Jonny Ruiz.  He told me he might be getting a  cold, although he does not have any other symptoms.  He seems to be coughing  and coughing.  His lungs are clear.  He is not having any production.  He  denies runny, stuffy nose, headache or other symptoms, although his throat  seems to be getting dry and he is becoming hoarse.  He tried some Delsyn  with no improvement.  He has no drug allergies.  He does have diabetes and  takes Lisinopril and metformin.  I told him there is a small possibility  this is an ACE cough from his Lisinopril but I could not tell him over the  phone.  I told him he could try some Mucinex DM to see if that works better  for his cough but to follow up in the office if he does not improve and also  to watch for other symptoms such as nasal symptoms or fever and to seek  attention or call back if his symptoms worsen, otherwise he will call the  office for followup.            ______________________________  Audrie Gallus. Tower, MD      MAT/MedQ  DD:  02/08/2006  DT:  02/09/2006  Job #:  [id number]   cc:   Corwin Levins, MD  Audrie Gallus Milinda Antis, MD

## 2010-12-01 ENCOUNTER — Encounter: Payer: Self-pay | Admitting: Internal Medicine

## 2010-12-01 DIAGNOSIS — Z0000 Encounter for general adult medical examination without abnormal findings: Secondary | ICD-10-CM | POA: Insufficient documentation

## 2010-12-01 DIAGNOSIS — Z0001 Encounter for general adult medical examination with abnormal findings: Secondary | ICD-10-CM | POA: Insufficient documentation

## 2010-12-02 ENCOUNTER — Other Ambulatory Visit: Payer: Self-pay

## 2010-12-02 ENCOUNTER — Other Ambulatory Visit: Payer: Self-pay | Admitting: Internal Medicine

## 2010-12-02 DIAGNOSIS — IMO0001 Reserved for inherently not codable concepts without codable children: Secondary | ICD-10-CM

## 2010-12-03 ENCOUNTER — Encounter: Payer: Self-pay | Admitting: Internal Medicine

## 2010-12-03 ENCOUNTER — Other Ambulatory Visit (INDEPENDENT_AMBULATORY_CARE_PROVIDER_SITE_OTHER): Payer: 59

## 2010-12-03 ENCOUNTER — Other Ambulatory Visit: Payer: Self-pay | Admitting: Internal Medicine

## 2010-12-03 DIAGNOSIS — IMO0001 Reserved for inherently not codable concepts without codable children: Secondary | ICD-10-CM

## 2010-12-03 LAB — BASIC METABOLIC PANEL
CO2: 29 mEq/L (ref 19–32)
Chloride: 105 mEq/L (ref 96–112)
Creatinine, Ser: 1.1 mg/dL (ref 0.4–1.5)
Glucose, Bld: 131 mg/dL — ABNORMAL HIGH (ref 70–99)
Sodium: 140 mEq/L (ref 135–145)

## 2010-12-03 LAB — LIPID PANEL
HDL: 36.1 mg/dL — ABNORMAL LOW (ref >39.00)
Total CHOL/HDL Ratio: 5
Triglycerides: 347 mg/dL — ABNORMAL HIGH (ref 0.0–149.0)

## 2010-12-03 LAB — LDL CHOLESTEROL, DIRECT: Direct LDL: 86.8 mg/dL

## 2010-12-03 LAB — HEMOGLOBIN A1C: Hgb A1c MFr Bld: 6.6 % — ABNORMAL HIGH (ref 4.6–6.5)

## 2010-12-09 ENCOUNTER — Encounter: Payer: Self-pay | Admitting: Internal Medicine

## 2010-12-09 ENCOUNTER — Ambulatory Visit (INDEPENDENT_AMBULATORY_CARE_PROVIDER_SITE_OTHER): Payer: 59 | Admitting: Internal Medicine

## 2010-12-09 VITALS — BP 120/80 | HR 60 | Temp 98.3°F | Ht 72.0 in | Wt 314.0 lb

## 2010-12-09 DIAGNOSIS — Z0000 Encounter for general adult medical examination without abnormal findings: Secondary | ICD-10-CM

## 2010-12-09 DIAGNOSIS — I10 Essential (primary) hypertension: Secondary | ICD-10-CM

## 2010-12-09 DIAGNOSIS — E119 Type 2 diabetes mellitus without complications: Secondary | ICD-10-CM

## 2010-12-09 DIAGNOSIS — M5090 Cervical disc disorder, unspecified, unspecified cervical region: Secondary | ICD-10-CM | POA: Insufficient documentation

## 2010-12-09 DIAGNOSIS — E785 Hyperlipidemia, unspecified: Secondary | ICD-10-CM

## 2010-12-09 NOTE — Patient Instructions (Signed)
Continue all other medications as before Please return in 6 mo with Lab testing done 3-5 days before  

## 2010-12-09 NOTE — Assessment & Plan Note (Signed)
stable overall by hx and exam, most recent data reviewed with pt, and pt to continue medical treatment as before  BP Readings from Last 3 Encounters:  12/09/10 120/80  06/10/10 122/70  04/19/10 126/80

## 2010-12-09 NOTE — Assessment & Plan Note (Signed)
stable overall by hx and exam, most recent data reviewed with pt, and pt to continue medical treatment as before  Last ldl 89 fev 24 reviewed with pt,  TG elev  But he declines fenofibrate at this time

## 2010-12-09 NOTE — Assessment & Plan Note (Signed)
stable overall by hx and exam, most recent data reviewed with pt, and pt to continue medical treatment as before  Lab Results  Component Value Date   HGBA1C 6.6* 12/03/2010

## 2010-12-09 NOTE — Progress Notes (Signed)
Subjective:    Patient ID: Joseph Maxwell, male    DOB: Jun 03, 1955, 55 y.o.   MRN: [id number]  HPI  Here to f/u; overall doing ok,  Pt denies chest pain, increased sob or doe, wheezing, orthopnea, PND, increased LE swelling, palpitations, dizziness or syncope.  Pt denies new neurological symptoms such as new headache, or facial or extremity weakness or numbness   Pt denies polydipsia, polyuria, or low sugar symptoms such as weakness or confusion improved with po intake.  Pt states overall good compliance with meds, trying to follow lower cholesterol, diabetic diet, wt overall stable but little exercise however, [facility].  Did undergo c-spine disc surgury late 2011 and doing well now with some residiual 22 finger righ hand numbness.  Plans to do better with diet and wt loss.  CBG's in the low 100's.  Overall good compliance with treatment, and good medicine tolerability.  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  Does have several months but now improved ongoing nasal allergy symptoms with clear congestion, itch and sneeze, without fever, pain, ST, cough or wheezing, and using albuterol very rarely.   Tremor no worse, cont's to se Dr Anne Hahn but no medication for his type of tremor.   Past Medical History  Diagnosis Date  . ALLERGIC RHINITIS 03/16/2007  . BACK PAIN 01/18/2010  . DIABETES MELLITUS 03/08/2007  . DIVERTICULOSIS, COLON 12/12/2009  . ERECTILE DYSFUNCTION 03/08/2007  . FAMILIAL TREMOR 05/08/2008  . GERD 05/08/2008  . HYPERLIPIDEMIA 03/08/2007  . HYPERTENSION 03/08/2007  . OBSTRUCTIVE SLEEP APNEA 01/25/2010  . ONYCHOMYCOSIS, TOENAILS 03/16/2007  . PLANTAR FASCIITIS, LEFT 12/12/2009  . Unspecified asthma 03/16/2007   Past Surgical History  Procedure Date  . Neck surgery 04/08/10    2 fusion and disctecomy/Dr. Wynetta Emery    reports that he has never smoked. He does not have any smokeless tobacco history on file. He reports that he drinks  alcohol. He reports that he does not use illicit drugs. family history includes Diabetes in his father, mother, and sister; Hypertension in his other; Lung cancer in his other; Prostate cancer in his father; and Sleep apnea in his mother. Allergies  Allergen Reactions  . Hydrocodone     REACTION: anxiety reaction   Current Outpatient Prescriptions on File Prior to Visit  Medication Sig Dispense Refill  . albuterol (PROVENTIL,VENTOLIN) 90 MCG/ACT inhaler Inhale 2 puffs into the lungs 4 (four) times daily.        Marland Kitchen aspirin 81 MG tablet Take 81 mg by mouth daily.        Marland Kitchen atenolol (TENORMIN) 25 MG tablet Take 25 mg by mouth daily.        Marland Kitchen gabapentin (NEURONTIN) 300 MG capsule Take 300 mg by mouth 3 (three) times daily.        Marland Kitchen glimepiride (AMARYL) 1 MG tablet Take 0.5 mg by mouth daily before breakfast.        . glucose blood (FREESTYLE LITE) test strip 1 each by Other route as needed. Use as instructed       . Lancets (FREESTYLE) lancets 1 each by Other route as needed. Use as instructed       . lisinopril-hydrochlorothiazide (PRINZIDE,ZESTORETIC) 20-12.5 MG per tablet Take 2 tablets by mouth daily.        Marland Kitchen loratadine (CLARITIN) 10 MG tablet Take 10 mg by mouth as needed.        . metFORMIN (GLUCOPHAGE)  500 MG tablet Take 1,000 mg by mouth 2 (two) times daily with a meal.        . niacin 500 MG tablet Take 1,500 mg by mouth daily with breakfast.        . omeprazole (PRILOSEC) 20 MG capsule Take 20 mg by mouth daily.        . sitaGLIPtin (JANUVIA) 100 MG tablet Take 100 mg by mouth daily.        . cyclobenzaprine (FLEXERIL) 5 MG tablet Take 5 mg by mouth 3 (three) times daily as needed.        Marland Kitchen HYDROcodone-acetaminophen (VICODIN) 5-500 MG per tablet Take 2 tablets by mouth 3 (three) times daily.         Review of Systems Review of Systems  Constitutional: Negative for diaphoresis and unexpected weight change.  HENT: Negative for drooling and tinnitus.   Eyes: Negative for photophobia  and visual disturbance.  Respiratory: Negative for choking and stridor.   Gastrointestinal: Negative for vomiting and blood in stool.  Genitourinary: Negative for hematuria and decreased urine volume.      Objective:   Physical Exam BP 120/80  Pulse 60  Temp(Src) 98.3 F (36.8 C) (Oral)  Ht 6' (1.829 m)  Wt 314 lb (142.429 kg)  BMI 42.59 kg/m2  SpO2 96% Physical Exam  VS noted Constitutional: Pt appears well-developed and well-nourished.  HENT: Head: Normocephalic.  Right Ear: External ear normal.  Left Ear: External ear normal.  Eyes: Conjunctivae and EOM are normal. Pupils are equal, round, and reactive to light.  Neck: Normal range of motion. Neck supple.  Cardiovascular: Normal rate and regular rhythm.   Pulmonary/Chest: Effort normal and breath sounds normal.  Abd:  Soft, NT, non-distended, + BS Neurological: Pt is alert. No cranial nerve deficit.  Skin: Skin is warm. No erythema.  Psychiatric: Pt behavior is normal. Thought content normal.         Assessment & Plan:   No problem-specific assessment & plan notes found for this encounter.

## 2011-04-09 ENCOUNTER — Other Ambulatory Visit: Payer: Self-pay | Admitting: Internal Medicine

## 2011-06-06 ENCOUNTER — Ambulatory Visit (INDEPENDENT_AMBULATORY_CARE_PROVIDER_SITE_OTHER): Payer: 59 | Admitting: Internal Medicine

## 2011-06-06 ENCOUNTER — Telehealth: Payer: Self-pay

## 2011-06-06 ENCOUNTER — Encounter: Payer: Self-pay | Admitting: Internal Medicine

## 2011-06-06 VITALS — BP 110/80 | HR 72 | Temp 99.2°F

## 2011-06-06 DIAGNOSIS — E119 Type 2 diabetes mellitus without complications: Secondary | ICD-10-CM

## 2011-06-06 DIAGNOSIS — J029 Acute pharyngitis, unspecified: Secondary | ICD-10-CM

## 2011-06-06 DIAGNOSIS — G4730 Sleep apnea, unspecified: Secondary | ICD-10-CM

## 2011-06-06 MED ORDER — AZITHROMYCIN 250 MG PO TABS: ORAL_TABLET | ORAL | Status: AC

## 2011-06-06 MED ORDER — HYDROCODONE-HOMATROPINE 5-1.5 MG/5ML PO SYRP: 5.0000 mL | ORAL_SOLUTION | Freq: Four times a day (QID) | ORAL | Status: DC | PRN

## 2011-06-06 NOTE — Progress Notes (Signed)
  Subjective:    HPI  complains of cold symptoms  Onset >1 week ago, wax/wane symptoms  Denies associated rhinorrhea, sneezing, headache  Associated with moderate severe sore throat and low grade fever Also myalgias, dry cough and mild chest congestion No relief with OTC meds Precipitated by sick contacts  Past Medical History  Diagnosis Date  . ALLERGIC RHINITIS   . BACK PAIN   . DIABETES MELLITUS   . DIVERTICULOSIS, COLON   . ERECTILE DYSFUNCTION   . FAMILIAL TREMOR   . GERD   . HYPERLIPIDEMIA   . HYPERTENSION   . OBSTRUCTIVE SLEEP APNEA   . ONYCHOMYCOSIS, TOENAILS   . PLANTAR FASCIITIS, LEFT   . Unspecified asthma   . Cervical disc disease     Review of Systems Constitutional: No night sweats, no unexpected weight change Pulmonary: No pleurisy or hemoptysis Cardiovascular: No chest pain or palpitations     Objective:   Physical Exam BP 110/80  Pulse 72  Temp(Src) 99.2 F (37.3 C) (Oral)  SpO2 96% GEN: mildly ill appearing, hoarse - no audible head/chest congestion HENT: NCAT, no sinus tenderness bilaterally, nares without clear discharge, oropharynx mod erythema, ? exudate Eyes: Vision grossly intact, no conjunctivitis Lungs: Clear to auscultation without rhonchi or wheeze, no increased work of breathing Cardiovascular: Regular rate and rhythm, no bilateral edema      Assessment & Plan:  Acute pharyngitis, question exudative Cough,related to above   Empiric antibiotics prescribed due to symptom duration greater than 7 days Prescription cough suppression - new prescriptions done Symptomatic care with Tylenol or Advil, hydration and rest -  salt gargle advised as needed

## 2011-06-06 NOTE — Assessment & Plan Note (Signed)
Reviewed possible transient exacerbation by acute illness - Patient to continue to monitor, no medication changes Followup with primary care as planned Lab Results  Component Value Date   HGBA1C 6.6* 12/03/2010

## 2011-06-06 NOTE — Assessment & Plan Note (Signed)
Using nasal CPAP as prescribed - continue same

## 2011-06-06 NOTE — Patient Instructions (Signed)
It was good to see you today. Z-Pak antibiotics for possible strep throat - also hydrocodone cough syrup as discussed for cough suppression and pain - Your prescription(s) have been submitted to your pharmacy. Please take as directed and contact our office if you believe you are having problem(s) with the medication(s). Warm salt water gargle, hydrate and rest. Alternate between ibuprofen and tylenol for aches, pain and fever symptoms as discussed Call if symptoms worse or unimproved in next 10-14 days

## 2011-06-06 NOTE — Telephone Encounter (Signed)
A user error has taken place: encounter opened in error, closed for administrative reasons.

## 2011-06-09 ENCOUNTER — Other Ambulatory Visit (INDEPENDENT_AMBULATORY_CARE_PROVIDER_SITE_OTHER): Payer: 59

## 2011-06-09 DIAGNOSIS — E119 Type 2 diabetes mellitus without complications: Secondary | ICD-10-CM

## 2011-06-09 DIAGNOSIS — Z0000 Encounter for general adult medical examination without abnormal findings: Secondary | ICD-10-CM

## 2011-06-09 LAB — URINALYSIS, ROUTINE W REFLEX MICROSCOPIC
Hgb urine dipstick: NEGATIVE
Leukocytes, UA: NEGATIVE
Urine Glucose: NEGATIVE
Urobilinogen, UA: 1 (ref 0.0–1.0)

## 2011-06-09 LAB — CBC WITH DIFFERENTIAL/PLATELET
Basophils Absolute: 0.1 10*3/uL (ref 0.0–0.1)
Eosinophils Absolute: 0.4 10*3/uL (ref 0.0–0.7)
HCT: 35.8 % — ABNORMAL LOW (ref 39.0–52.0)
Hemoglobin: 12 g/dL — ABNORMAL LOW (ref 13.0–17.0)
Lymphs Abs: 2.4 10*3/uL (ref 0.7–4.0)
MCHC: 33.5 g/dL (ref 30.0–36.0)
Neutro Abs: 6.8 10*3/uL (ref 1.4–7.7)
Platelets: 192 10*3/uL (ref 150.0–400.0)
RDW: 13.3 % (ref 11.5–14.6)

## 2011-06-09 LAB — TSH: TSH: 1.31 u[IU]/mL (ref 0.35–5.50)

## 2011-06-09 LAB — HEMOGLOBIN A1C: Hgb A1c MFr Bld: 5.8 % (ref 4.6–6.5)

## 2011-06-09 LAB — HEPATIC FUNCTION PANEL
AST: 18 U/L (ref 0–37)
Alkaline Phosphatase: 81 U/L (ref 39–117)
Bilirubin, Direct: 0.1 mg/dL (ref 0.0–0.3)
Total Protein: 7 g/dL (ref 6.0–8.3)

## 2011-06-09 LAB — BASIC METABOLIC PANEL
CO2: 29 mEq/L (ref 19–32)
Calcium: 8.9 mg/dL (ref 8.4–10.5)
GFR: 71.16 mL/min (ref >60.00)
Glucose, Bld: 133 mg/dL — ABNORMAL HIGH (ref 70–99)
Potassium: 4.3 mEq/L (ref 3.5–5.1)
Sodium: 139 mEq/L (ref 135–145)

## 2011-06-09 LAB — LIPID PANEL
HDL: 31.1 mg/dL — ABNORMAL LOW (ref >39.00)
Total CHOL/HDL Ratio: 5

## 2011-06-09 LAB — MICROALBUMIN / CREATININE URINE RATIO
Creatinine,U: 421.8 mg/dL
Microalb Creat Ratio: 0.4 mg/g (ref 0.0–30.0)

## 2011-06-09 LAB — PSA: PSA: 0.69 ng/mL (ref 0.10–4.00)

## 2011-06-12 ENCOUNTER — Ambulatory Visit (INDEPENDENT_AMBULATORY_CARE_PROVIDER_SITE_OTHER): Payer: 59 | Admitting: Internal Medicine

## 2011-06-12 ENCOUNTER — Encounter: Payer: Self-pay | Admitting: Internal Medicine

## 2011-06-12 VITALS — BP 112/78 | HR 77 | Temp 98.0°F | Ht 72.0 in | Wt 301.5 lb

## 2011-06-12 DIAGNOSIS — E119 Type 2 diabetes mellitus without complications: Secondary | ICD-10-CM

## 2011-06-12 DIAGNOSIS — Z0000 Encounter for general adult medical examination without abnormal findings: Secondary | ICD-10-CM

## 2011-06-12 MED ORDER — ALBUTEROL 90 MCG/ACT IN AERS: 2.0000 | INHALATION_SPRAY | Freq: Four times a day (QID) | RESPIRATORY_TRACT | Status: DC

## 2011-06-12 MED ORDER — NIACIN 500 MG PO TABS: 1500.0000 mg | ORAL_TABLET | Freq: Every day | ORAL | Status: DC

## 2011-06-12 MED ORDER — ATENOLOL 25 MG PO TABS: 25.0000 mg | ORAL_TABLET | Freq: Every day | ORAL | Status: DC

## 2011-06-12 MED ORDER — GABAPENTIN 300 MG PO CAPS: 300.0000 mg | ORAL_CAPSULE | Freq: Three times a day (TID) | ORAL | Status: DC

## 2011-06-12 MED ORDER — METFORMIN HCL 500 MG PO TABS: 1000.0000 mg | ORAL_TABLET | Freq: Two times a day (BID) | ORAL | Status: DC

## 2011-06-12 MED ORDER — GLUCOSE BLOOD VI STRP: ORAL_STRIP | Status: DC

## 2011-06-12 MED ORDER — FREESTYLE LANCETS MISC: Status: DC

## 2011-06-12 MED ORDER — OMEPRAZOLE 20 MG PO CPDR: 20.0000 mg | DELAYED_RELEASE_CAPSULE | Freq: Every day | ORAL | Status: DC

## 2011-06-12 MED ORDER — SITAGLIPTIN PHOSPHATE 100 MG PO TABS: 100.0000 mg | ORAL_TABLET | Freq: Every day | ORAL | Status: DC

## 2011-06-12 MED ORDER — LISINOPRIL-HYDROCHLOROTHIAZIDE 20-12.5 MG PO TABS: 2.0000 | ORAL_TABLET | Freq: Every day | ORAL | Status: DC

## 2011-06-12 NOTE — Assessment & Plan Note (Addendum)

## 2011-06-12 NOTE — Patient Instructions (Addendum)
OK to stop the glimeparide (amaryl) Continue all other medications as before Your prescriptions were sent to the pharmacy You are otherwise up to date with prevention today Please return in 6 mo with Lab testing done 3-5 days before

## 2011-06-12 NOTE — Progress Notes (Signed)
Subjective:    Patient ID: Joseph Maxwell, male    DOB: 1955/08/30, 56 y.o.   MRN: [id number]  HPI  Here for wellness and f/u;  Overall doing ok;  Pt denies CP, worsening SOB, DOE, wheezing, orthopnea, PND, worsening LE edema, palpitations, dizziness or syncope.  Pt denies neurological change such as new Headache, facial or extremity weakness.  Pt denies polydipsia, polyuria, or low sugar symptoms. Pt states overall good compliance with treatment and medications, good tolerability, and trying to follow lower cholesterol diet.  Pt denies worsening depressive symptoms, suicidal ideation or panic. No fever, wt loss, night sweats, loss of appetite, or other constitutional symptoms.  Pt states good ability with ADL's, low fall risk, home safety reviewed and adequate, no significant changes in hearing or vision, and occasionally active with exercise.  Did have an episode of neck pain, s/p c-spine surgury nov 2011, saw Dr Wynetta Emery with intact fusion but felt scar tissue with nerve irritation, doing PT at home, and improved.  [facility] and [facility] without pain.   Past Medical History  Diagnosis Date  . ALLERGIC RHINITIS   . BACK PAIN   . DIABETES MELLITUS   . DIVERTICULOSIS, COLON   . ERECTILE DYSFUNCTION   . FAMILIAL TREMOR   . GERD   . HYPERLIPIDEMIA   . HYPERTENSION   . OBSTRUCTIVE SLEEP APNEA   . ONYCHOMYCOSIS, TOENAILS   . PLANTAR FASCIITIS, LEFT   . Unspecified asthma   . Cervical disc disease    Past Surgical History  Procedure Date  . Neck surgery 04/08/10    2 fusion and disctecomy/Dr. Wynetta Emery    reports that he has never smoked. He does not have any smokeless tobacco history on file. He reports that he drinks alcohol. He reports that he does not use illicit drugs. family history includes Diabetes in his father, mother, and sister; Hypertension in his other; Lung cancer in his other; Prostate cancer in his father; and Sleep apnea in his mother. Allergies  Allergen  Reactions  . Hydrocodone     REACTION: anxiety reaction   Current Outpatient Prescriptions on File Prior to Visit  Medication Sig Dispense Refill  . albuterol (PROVENTIL,VENTOLIN) 90 MCG/ACT inhaler Inhale 2 puffs into the lungs 4 (four) times daily.        Marland Kitchen aspirin 81 MG tablet Take 81 mg by mouth daily.        Marland Kitchen atenolol (TENORMIN) 25 MG tablet Take 25 mg by mouth daily.        Marland Kitchen gabapentin (NEURONTIN) 300 MG capsule Take 300 mg by mouth 3 (three) times daily.        Marland Kitchen glimepiride (AMARYL) 1 MG tablet Take 0.5 mg by mouth daily before breakfast.        . glucose blood (FREESTYLE LITE) test strip 1 each by Other route as needed. Use as instructed       . HYDROcodone-homatropine (HYDROMET) 5-1.5 MG/5ML syrup Take 5 mLs by mouth every 6 (six) hours as needed for cough.  120 mL  0  . Lancets (FREESTYLE) lancets 1 each by Other route as needed. Use as instructed       . lisinopril-hydrochlorothiazide (PRINZIDE,ZESTORETIC) 20-12.5 MG per tablet Take 2 tablets by mouth daily.        Marland Kitchen loratadine (CLARITIN) 10 MG tablet Take 10 mg by mouth as needed.        . metFORMIN (GLUCOPHAGE) 500 MG tablet TAKE TWO TABLETS BY MOUTH  TWICE DAILY  120 tablet  2  . niacin 500 MG tablet Take 1,500 mg by mouth daily with breakfast.        . omeprazole (PRILOSEC) 20 MG capsule Take 20 mg by mouth daily.        . sitaGLIPtin (JANUVIA) 100 MG tablet Take 100 mg by mouth daily.        Marland Kitchen azithromycin (ZITHROMAX Z-PAK) 250 MG tablet Take 2 tablets (500 mg) on  Day 1,  followed by 1 tablet (250 mg) once daily on Days 2 through 5.  6 each  0   Review of Systems Review of Systems  Constitutional: Negative for diaphoresis, activity change, appetite change and unexpected weight change.  HENT: Negative for hearing loss, ear pain, facial swelling, mouth sores and neck stiffness.   Eyes: Negative for pain, redness and visual disturbance.  Respiratory: Negative for shortness of breath and wheezing.   Cardiovascular: Negative  for chest pain and palpitations.  Gastrointestinal: Negative for diarrhea, blood in stool, abdominal distention and rectal pain.  Genitourinary: Negative for hematuria, flank pain and decreased urine volume.  Musculoskeletal: Negative for myalgias and joint swelling.  Skin: Negative for color change and wound.  Neurological: Negative for syncope and numbness.  Hematological: Negative for adenopathy.  Psychiatric/Behavioral: Negative for hallucinations, self-injury, decreased concentration and agitation.      Objective:   Physical Exam BP 112/78  Pulse 77  Temp(Src) 98 F (36.7 C) (Oral)  Ht 6' (1.829 m)  Wt 301 lb 8 oz (136.76 kg)  BMI 40.89 kg/m2  SpO2 93% Physical Exam  VS noted Constitutional: Pt is oriented to person, place, and time. Appears well-developed and well-nourished.  HENT:  Head: Normocephalic and atraumatic.  Right Ear: External ear normal.  Left Ear: External ear normal.  Nose: Nose normal.  Mouth/Throat: Oropharynx is clear and moist.  Eyes: Conjunctivae and EOM are normal. Pupils are equal, round, and reactive to light.  Neck: Normal range of motion. Neck supple. No JVD present. No tracheal deviation present.  Cardiovascular: Normal rate, regular rhythm, normal heart sounds and intact distal pulses.   Pulmonary/Chest: Effort normal and breath sounds normal.  Abdominal: Soft. Bowel sounds are normal. There is no tenderness.  Musculoskeletal: Normal range of motion. Exhibits no edema.  Lymphadenopathy:  Has no cervical adenopathy.  Neurological: Pt is alert and oriented to person, place, and time. Pt has normal reflexes. No cranial nerve deficit.  Skin: Skin is warm and dry. No rash noted.  Psychiatric:  Has  normal mood and affect. Behavior is normal.     Assessment & Plan:

## 2011-06-12 NOTE — Assessment & Plan Note (Addendum)
Improved overall with better diet and some wt loss, okk to d/c thje amaryl,  to f/u any worsening symptoms or concerns  Lab Results  Component Value Date   HGBA1C 5.8 06/09/2011

## 2011-06-13 ENCOUNTER — Other Ambulatory Visit: Payer: Self-pay | Admitting: Internal Medicine

## 2011-06-15 ENCOUNTER — Encounter: Payer: Self-pay | Admitting: Internal Medicine

## 2011-06-17 ENCOUNTER — Telehealth: Payer: Self-pay

## 2011-06-17 NOTE — Telephone Encounter (Signed)
Called patient informed form has been completed and can pickup at front desk.

## 2011-07-21 ENCOUNTER — Other Ambulatory Visit: Payer: Self-pay | Admitting: Internal Medicine

## 2011-08-27 ENCOUNTER — Other Ambulatory Visit: Payer: Self-pay | Admitting: Internal Medicine

## 2011-09-18 ENCOUNTER — Other Ambulatory Visit: Payer: Self-pay | Admitting: Internal Medicine

## 2011-10-07 ENCOUNTER — Telehealth: Payer: Self-pay | Admitting: Internal Medicine

## 2011-10-07 DIAGNOSIS — IMO0001 Reserved for inherently not codable concepts without codable children: Secondary | ICD-10-CM

## 2011-10-07 NOTE — Telephone Encounter (Signed)
Message copied by Corwin Levins on Tue Oct 07, 2011 11:59 AM ------      Message from: Pincus Sanes      Created: Tue Oct 07, 2011 11:12 AM      Regarding: FW: A1C REQUEST                   ----- Message -----         From: Domenica Reamer         Sent: 10/02/2011   8:11 AM           To: Vladimir Crofts Ewing      Subject: A1C REQUEST                                              PT REQUESTING A1C BEFORE June 6 APPT--THANKS

## 2011-10-07 NOTE — Telephone Encounter (Signed)
Done per emr 

## 2011-10-13 ENCOUNTER — Other Ambulatory Visit (INDEPENDENT_AMBULATORY_CARE_PROVIDER_SITE_OTHER): Payer: 59

## 2011-10-13 DIAGNOSIS — IMO0001 Reserved for inherently not codable concepts without codable children: Secondary | ICD-10-CM

## 2011-10-13 LAB — LIPID PANEL
HDL: 37.8 mg/dL — ABNORMAL LOW (ref >39.00)
Total CHOL/HDL Ratio: 4
Triglycerides: 231 mg/dL — ABNORMAL HIGH (ref 0.0–149.0)

## 2011-10-13 LAB — BASIC METABOLIC PANEL
Calcium: 9.2 mg/dL (ref 8.4–10.5)
Creatinine, Ser: 1.1 mg/dL (ref 0.4–1.5)
GFR: 73.29 mL/min (ref >60.00)
Glucose, Bld: 126 mg/dL — ABNORMAL HIGH (ref 70–99)
Sodium: 140 mEq/L (ref 135–145)

## 2011-10-16 ENCOUNTER — Ambulatory Visit (INDEPENDENT_AMBULATORY_CARE_PROVIDER_SITE_OTHER): Payer: 59 | Admitting: Internal Medicine

## 2011-10-16 ENCOUNTER — Encounter: Payer: Self-pay | Admitting: Internal Medicine

## 2011-10-16 VITALS — BP 110/72 | HR 67 | Temp 97.0°F | Ht 72.0 in | Wt 304.4 lb

## 2011-10-16 DIAGNOSIS — I10 Essential (primary) hypertension: Secondary | ICD-10-CM

## 2011-10-16 DIAGNOSIS — E119 Type 2 diabetes mellitus without complications: Secondary | ICD-10-CM

## 2011-10-16 DIAGNOSIS — E785 Hyperlipidemia, unspecified: Secondary | ICD-10-CM

## 2011-10-16 MED ORDER — SITAGLIPTIN PHOSPHATE 100 MG PO TABS: 100.0000 mg | ORAL_TABLET | Freq: Every day | ORAL | Status: DC

## 2011-10-16 NOTE — Patient Instructions (Addendum)
Continue all other medications as before Please have the pharmacy call with any refills you may need. The form for the Januvia Patient Assist was filled out today Please return in Jan 2014, and please call about 1 week ahead if you want blood work done before (if ok with insurance)

## 2011-10-16 NOTE — Assessment & Plan Note (Signed)
stable overall by hx and exam, most recent data reviewed with pt, and pt to continue medical treatment as before Lab Results  Component Value Date   HGBA1C 7.2* 10/13/2011

## 2011-10-16 NOTE — Assessment & Plan Note (Signed)
stable overall by hx and exam, most recent data reviewed with pt, and pt to continue medical treatment as before Lab Results  Component Value Date   LDLCALC 82 06/09/2011

## 2011-10-16 NOTE — Progress Notes (Signed)
Subjective:    Patient ID: Joseph Maxwell, male    DOB: 30-Apr-1956, 56 y.o.   MRN: [id number]  HPI  Here to f/u; overall doing ok,  Pt denies chest pain, increased sob or doe, wheezing, orthopnea, PND, increased LE swelling, palpitations, dizziness or syncope.  Pt denies new neurological symptoms such as new headache, or facial or extremity weakness or numbness   Pt denies polydipsia, polyuria, or low sugar symptoms such as weakness or confusion improved with po intake.  Pt states overall good compliance with meds, trying to follow lower cholesterol, diabetic diet, wt overall stable but little exercise however.  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  May have a lead on a new job soon.  Allergy symptoms seem under control at this point,  Denies worsening reflux, dysphagia, abd pain, n/v, bowel change or blood. Past Medical History  Diagnosis Date  . ALLERGIC RHINITIS   . BACK PAIN   . DIABETES MELLITUS   . DIVERTICULOSIS, COLON   . ERECTILE DYSFUNCTION   . FAMILIAL TREMOR   . GERD   . HYPERLIPIDEMIA   . HYPERTENSION   . OBSTRUCTIVE SLEEP APNEA   . ONYCHOMYCOSIS, TOENAILS   . PLANTAR FASCIITIS, LEFT   . Unspecified asthma   . Cervical disc disease    Past Surgical History  Procedure Date  . Neck surgery 04/08/10    2 fusion and disctecomy/Dr. Wynetta Emery    reports that he has never smoked. He does not have any smokeless tobacco history on file. He reports that he drinks alcohol. He reports that he does not use illicit drugs. family history includes Diabetes in his father, mother, and sister; Hypertension in his other; Lung cancer in his other; Prostate cancer in his father; and Sleep apnea in his mother. Allergies  Allergen Reactions  . Hydrocodone     REACTION: anxiety reaction   Current Outpatient Prescriptions on File Prior to Visit  Medication Sig Dispense Refill  .  albuterol (PROVENTIL,VENTOLIN) 90 MCG/ACT inhaler Inhale 2 puffs into the lungs 4 (four) times daily.  17 g  5  . aspirin 81 MG tablet Take 81 mg by mouth daily.        Marland Kitchen atenolol (TENORMIN) 25 MG tablet Take 1 tablet (25 mg total) by mouth daily.  90 tablet  3  . atorvastatin (LIPITOR) 40 MG tablet TAKE ONE TABLET BY MOUTH ONE TIME DAILY  30 tablet  11  . glucose blood (FREESTYLE LITE) test strip Use as directed 1 per day  100 each  11  . JANUVIA 100 MG tablet TAKE ONE TABLET BY MOUTH ONE TIME DAILY  90 each  3  . Lancets (FREESTYLE) lancets Use as directed 1 per day  100 each  3  . lisinopril-hydrochlorothiazide (PRINZIDE,ZESTORETIC) 20-12.5 MG per tablet TAKE TWO TABLETS BY MOUTH DAILY  180 tablet  3  . loratadine (CLARITIN) 10 MG tablet Take 10 mg by mouth as needed.        . metFORMIN (GLUCOPHAGE) 500 MG tablet Take 2 tablets (1,000 mg total) by mouth 2 (two) times daily with a meal.  360 tablet  3  . niacin 500 MG tablet Take 3 tablets (1,500 mg total) by mouth daily with breakfast.  270 tablet  3  . omeprazole (PRILOSEC) 20 MG capsule Take 1 capsule (20 mg total) by mouth daily.  90 capsule  3  . gabapentin (NEURONTIN) 300 MG capsule Take 1 capsule (300 mg total) by mouth 3 (three) times daily.  270 capsule  1    Review of Systems Review of Systems  Constitutional: Negative for diaphoresis and unexpected weight change.  Eyes: Negative for photophobia and visual disturbance.  Respiratory: Negative for choking and stridor.   Gastrointestinal: Negative for vomiting and blood in stool.  Genitourinary: Negative for hematuria and decreased urine volume.  Musculoskeletal: Negative for gait problem.  Skin: Negative for color change and wound.  Neurological: Negative for tremors and numbness.  Psychiatric/Behavioral: Negative for decreased concentration. The patient is not hyperactive.       Objective:   Physical Exam BP 110/72  Pulse 67  Temp(Src) 97 F (36.1 C) (Oral)  Ht 6' (1.829  m)  Wt 304 lb 6 oz (138.064 kg)  BMI 41.28 kg/m2  SpO2 95% Physical Exam  VS noted Constitutional: Pt appears well-developed and well-nourished.  HENT: Head: Normocephalic.  Right Ear: External ear normal.  Left Ear: External ear normal.  Eyes: Conjunctivae and EOM are normal. Pupils are equal, round, and reactive to light.  Neck: Normal range of motion. Neck supple.  Cardiovascular: Normal rate and regular rhythm.   Pulmonary/Chest: Effort normal and breath sounds normal.  Abd:  Soft, NT, non-distended, + BS Neurological: Pt is alert. No cranial nerve deficit.  Skin: Skin is warm. No erythema.  Psychiatric: Pt behavior is normal. Thought content normal. 1+ nervous    Assessment & Plan:

## 2011-10-16 NOTE — Assessment & Plan Note (Signed)
BP Readings from Last 3 Encounters:  10/16/11 110/72  06/12/11 112/78  06/06/11 110/80   stable overall by hx and exam, most recent data reviewed with pt, and pt to continue medical treatment as before

## 2011-12-11 ENCOUNTER — Ambulatory Visit: Payer: 59 | Admitting: Internal Medicine

## 2012-06-16 ENCOUNTER — Other Ambulatory Visit: Payer: Self-pay | Admitting: Internal Medicine

## 2012-07-19 ENCOUNTER — Other Ambulatory Visit (INDEPENDENT_AMBULATORY_CARE_PROVIDER_SITE_OTHER): Payer: 59

## 2012-07-19 ENCOUNTER — Telehealth: Payer: Self-pay

## 2012-07-19 DIAGNOSIS — IMO0001 Reserved for inherently not codable concepts without codable children: Secondary | ICD-10-CM

## 2012-07-19 DIAGNOSIS — Z0000 Encounter for general adult medical examination without abnormal findings: Secondary | ICD-10-CM

## 2012-07-19 LAB — HEPATIC FUNCTION PANEL
ALT: 31 U/L (ref 0–53)
AST: 28 U/L (ref 0–37)
Albumin: 3.7 g/dL (ref 3.5–5.2)
Alkaline Phosphatase: 71 U/L (ref 39–117)
Bilirubin, Direct: 0.1 mg/dL (ref 0.0–0.3)
Total Protein: 6.9 g/dL (ref 6.0–8.3)

## 2012-07-19 LAB — LIPID PANEL
Cholesterol: 164 mg/dL (ref 0–200)
HDL: 30.4 mg/dL — ABNORMAL LOW (ref >39.00)
Total CHOL/HDL Ratio: 5
Triglycerides: 272 mg/dL — ABNORMAL HIGH (ref 0.0–149.0)
VLDL: 54.4 mg/dL — ABNORMAL HIGH (ref 0.0–40.0)

## 2012-07-19 LAB — URINALYSIS, ROUTINE W REFLEX MICROSCOPIC
Bilirubin Urine: NEGATIVE
Hgb urine dipstick: NEGATIVE
Ketones, ur: NEGATIVE
Total Protein, Urine: NEGATIVE
Urine Glucose: NEGATIVE

## 2012-07-19 LAB — CBC WITH DIFFERENTIAL/PLATELET
Basophils Absolute: 0.1 10*3/uL (ref 0.0–0.1)
Eosinophils Relative: 3.2 % (ref 0.0–5.0)
HCT: 39.5 % (ref 39.0–52.0)
Hemoglobin: 13.2 g/dL (ref 13.0–17.0)
Lymphocytes Relative: 16.8 % (ref 12.0–46.0)
Monocytes Relative: 6.7 % (ref 3.0–12.0)
Platelets: 171 10*3/uL (ref 150.0–400.0)
RDW: 14.4 % (ref 11.5–14.6)
WBC: 10.6 10*3/uL — ABNORMAL HIGH (ref 4.5–10.5)

## 2012-07-19 LAB — BASIC METABOLIC PANEL
CO2: 26 mEq/L (ref 19–32)
Chloride: 102 mEq/L (ref 96–112)
Glucose, Bld: 171 mg/dL — ABNORMAL HIGH (ref 70–99)
Sodium: 137 mEq/L (ref 135–145)

## 2012-07-19 NOTE — Telephone Encounter (Signed)
Labs ordered.

## 2012-07-19 NOTE — Telephone Encounter (Signed)
Patient would like hgba1c added to cpx labs.

## 2012-07-19 NOTE — Telephone Encounter (Signed)
Labs entered.

## 2012-07-19 NOTE — Telephone Encounter (Signed)
Good idea  Ok for hgba1c 250.02 and cpx labs

## 2012-07-23 ENCOUNTER — Ambulatory Visit (INDEPENDENT_AMBULATORY_CARE_PROVIDER_SITE_OTHER): Payer: BC Managed Care – PPO | Admitting: Internal Medicine

## 2012-07-23 ENCOUNTER — Encounter: Payer: Self-pay | Admitting: Internal Medicine

## 2012-07-23 VITALS — BP 120/78 | HR 75 | Temp 96.9°F | Ht 72.0 in | Wt 309.2 lb

## 2012-07-23 DIAGNOSIS — E119 Type 2 diabetes mellitus without complications: Secondary | ICD-10-CM

## 2012-07-23 DIAGNOSIS — Z0000 Encounter for general adult medical examination without abnormal findings: Secondary | ICD-10-CM

## 2012-07-23 DIAGNOSIS — Z23 Encounter for immunization: Secondary | ICD-10-CM

## 2012-07-23 MED ORDER — GLIPIZIDE ER 2.5 MG PO TB24: 2.5000 mg | ORAL_TABLET | Freq: Every day | ORAL | Status: DC

## 2012-07-23 NOTE — Patient Instructions (Addendum)
Please take all new medication as prescribed - the new sugar medication Please continue all other medications as before, and refills have been done if requested. Please have the pharmacy call with any other refills you may need. Please continue your efforts at being more active, low cholesterol diet, and weight control. You are otherwise up to date with prevention measures today. Your [employer] form was filled out today Thank you for enrolling in MyChart. Please follow the instructions below to securely access your online medical record. MyChart allows you to send messages to your doctor, view your test results, renew your prescriptions, schedule appointments, and more. To Log into My Chart online, please go by [employer] or [employer] to [email or web address], or download the MyChart App from the [employer] of [employer] .  Your Username is: [email or web address] (pass [phone]) Please send a practice Message on Mychart later today. Please return in 6 months, or sooner if needed, with Lab testing done 3-5 days before

## 2012-07-23 NOTE — Addendum Note (Signed)
Addended by: Scharlene Gloss B on: 07/23/2012 10:46 AM   Modules accepted: Orders

## 2012-07-23 NOTE — Progress Notes (Signed)
Subjective:    Patient ID: Joseph Maxwell, male    DOB: [birth year], 57 y.o.   MRN: [id number]  HPI Here for wellness and f/u;  Overall doing ok;  Pt denies CP, worsening SOB, DOE, wheezing, orthopnea, PND, worsening LE edema, palpitations, dizziness or syncope.  Pt denies neurological change such as new headache, facial or extremity weakness.  Pt denies polydipsia, polyuria, or low sugar symptoms. Pt states overall good compliance with treatment and medications, good tolerability, and has been trying to follow lower cholesterol diet.  Pt denies worsening depressive symptoms, suicidal ideation or panic. No fever, night sweats, wt loss, loss of appetite, or other constitutional symptoms.  Pt states good ability with ADL's, has low fall risk, home safety reviewed and adequate, no other significant changes in hearing or vision, and only occasionally active with exercise, less than usual lately since he has not been working since June 2013.  Has some mild depressive symtpoms and wife has suggested tx but he declines Past Medical History  Diagnosis Date  . ALLERGIC RHINITIS   . BACK PAIN   . DIABETES MELLITUS   . DIVERTICULOSIS, COLON   . ERECTILE DYSFUNCTION   . FAMILIAL TREMOR   . GERD   . HYPERLIPIDEMIA   . HYPERTENSION   . OBSTRUCTIVE SLEEP APNEA   . ONYCHOMYCOSIS, TOENAILS   . PLANTAR FASCIITIS, LEFT   . Unspecified asthma   . Cervical disc disease    Past Surgical History  Procedure Laterality Date  . Neck surgery  04/08/10    2 fusion and disctecomy/Dr. Wynetta Emery    reports that he has never smoked. He does not have any smokeless tobacco history on file. He reports that  drinks alcohol. He reports that he does not use illicit drugs. family history includes Diabetes in his father, mother, and sister; Hypertension in his other; Lung cancer in his other; Prostate cancer in his father; and Sleep apnea in his mother. Allergies  Allergen Reactions  . Hydrocodone     REACTION: anxiety reaction    Current Outpatient Prescriptions on File Prior to Visit  Medication Sig Dispense Refill  . albuterol (PROVENTIL,VENTOLIN) 90 MCG/ACT inhaler Inhale 2 puffs into the lungs 4 (four) times daily.  17 g  5  . aspirin 81 MG tablet Take 81 mg by mouth daily.        Marland Kitchen atenolol (TENORMIN) 25 MG tablet TAKE ONE TABLET BY MOUTH ONE TIME DAILY  30 tablet  2  . atorvastatin (LIPITOR) 40 MG tablet TAKE ONE TABLET BY MOUTH ONE TIME DAILY  30 tablet  11  . glucose blood (FREESTYLE LITE) test strip Use as directed 1 per day  100 each  11  . Lancets (FREESTYLE) lancets Use as directed 1 per day  100 each  3  . lisinopril-hydrochlorothiazide (PRINZIDE,ZESTORETIC) 20-12.5 MG per tablet TAKE TWO TABLETS BY MOUTH DAILY  180 tablet  3  . loratadine (CLARITIN) 10 MG tablet Take 10 mg by mouth as needed.        . metFORMIN (GLUCOPHAGE) 500 MG tablet TAKE TWO TABLETS BY MOUTH TWICE DAILY WITH MEALS.  120 tablet  2  . niacin 500 MG tablet Take 3 tablets (1,500 mg total) by mouth daily with breakfast.  270 tablet  3  . omeprazole (PRILOSEC) 20 MG capsule TAKE ONE CAPSULE BY MOUTH EVERY DAY  90 capsule  0  . sitaGLIPtin (JANUVIA) 100 MG tablet Take 1 tablet (100 mg total) by mouth daily.  90 tablet  3   No current facility-administered medications on file prior to visit.   Review of Systems Constitutional: Negative for diaphoresis, activity change, appetite change or unexpected weight change.  HENT: Negative for hearing loss, ear pain, facial swelling, mouth sores and neck stiffness.   Eyes: Negative for pain, redness and visual disturbance.  Respiratory: Negative for shortness of breath and wheezing.   Cardiovascular: Negative for chest pain and palpitations.  Gastrointestinal: Negative for diarrhea, blood in stool, abdominal distention or other pain Genitourinary: Negative for hematuria, flank pain or change in urine volume.  Musculoskeletal: Negative for myalgias and joint swelling.  Skin: Negative for color  change and wound.  Neurological: Negative for syncope and numbness. other than noted Hematological: Negative for adenopathy.  Psychiatric/Behavioral: Negative for hallucinations, self-injury, decreased concentration and agitation.  Still has some tingling to right 4th and 5th fingers related to prior c-spine surgury    Objective:   Physical Exam BP 120/78  Pulse 75  Temp(Src) 96.9 F (36.1 C) (Oral)  Ht 6' (1.829 m)  Wt 309 lb 4 oz (140.275 kg)  BMI 41.93 kg/m2  SpO2 93% VS noted,  Constitutional: Pt is oriented to person, place, and time. Appears well-developed and well-nourished.  Head: Normocephalic and atraumatic.  Right Ear: External ear normal.  Left Ear: External ear normal.  Nose: Nose normal.  Mouth/Throat: Oropharynx is clear and moist.  Eyes: Conjunctivae and EOM are normal. Pupils are equal, round, and reactive to light.  Neck: Normal range of motion. Neck supple. No JVD present. No tracheal deviation present.  Cardiovascular: Normal rate, regular rhythm, normal heart sounds and intact distal pulses.   Pulmonary/Chest: Effort normal and breath sounds normal.  Abdominal: Soft. Bowel sounds are normal. There is no tenderness. No HSM  Musculoskeletal: Normal range of motion. Exhibits no edema.  Lymphadenopathy:  Has no cervical adenopathy.  Neurological: Pt is alert and oriented to person, place, and time. Pt has normal reflexes. No cranial nerve deficit.  Skin: Skin is warm and dry. No rash noted.  Psychiatric:  Has mild dysphoric mood and affect. Behavior is normal.     Assessment & Plan:

## 2012-07-23 NOTE — Assessment & Plan Note (Signed)
Mild uncontrolld - to add glipizide 2.5 qd, work on diet. Exercise, wt loss, f/u next visit

## 2012-07-23 NOTE — Assessment & Plan Note (Signed)

## 2012-08-06 ENCOUNTER — Encounter: Payer: Self-pay | Admitting: Internal Medicine

## 2012-09-14 ENCOUNTER — Other Ambulatory Visit: Payer: Self-pay | Admitting: Internal Medicine

## 2012-09-16 IMAGING — CR DG CERVICAL SPINE 2 OR 3 VIEWS
5 series · 5 of 5 positions shown · non-contrast
Comparison: None.

CLINICAL DATA: Preoperative study for anterior disc surgery.

CERVICAL SPINE - 2-3 VIEW

[w c-spine lat (1 of 2)]
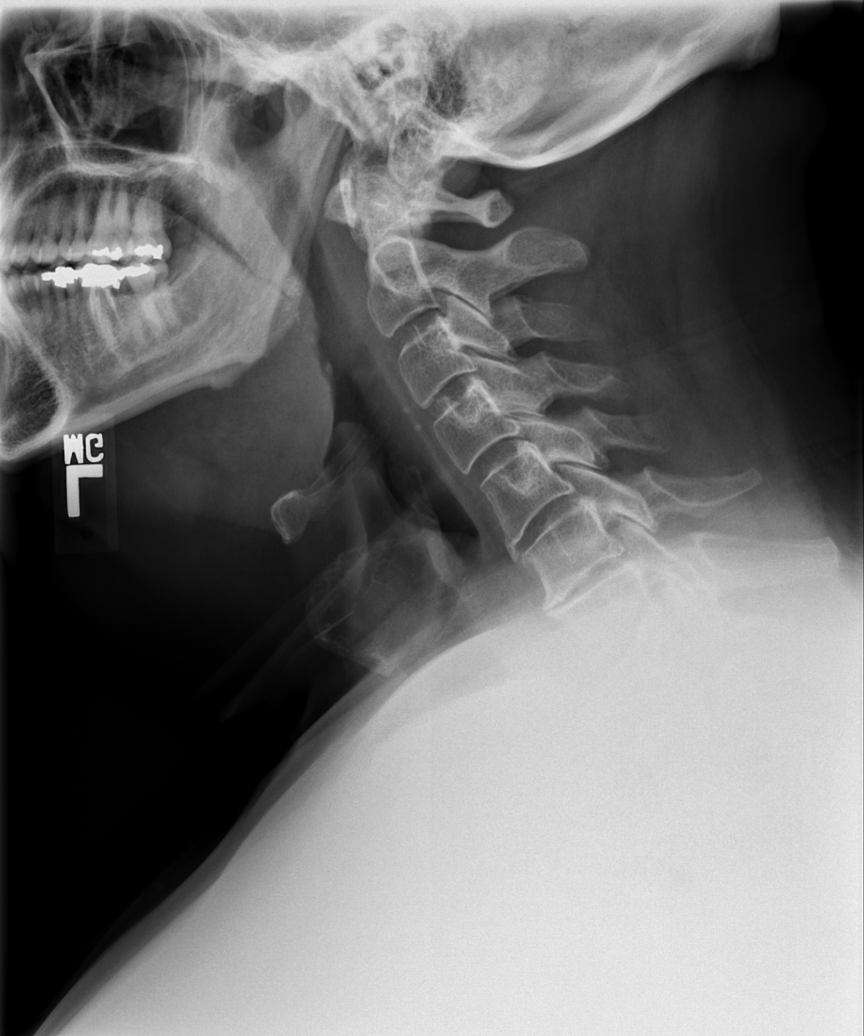

[w c-spine lat (2 of 2)]
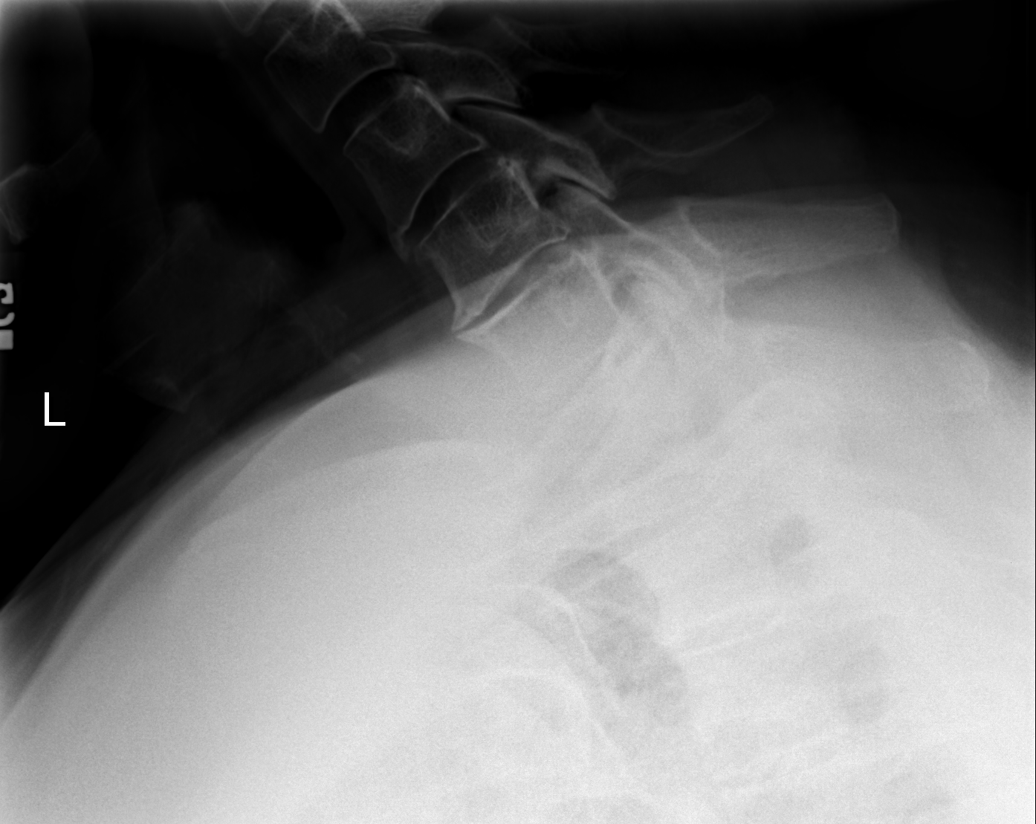

[w swimmers view *]
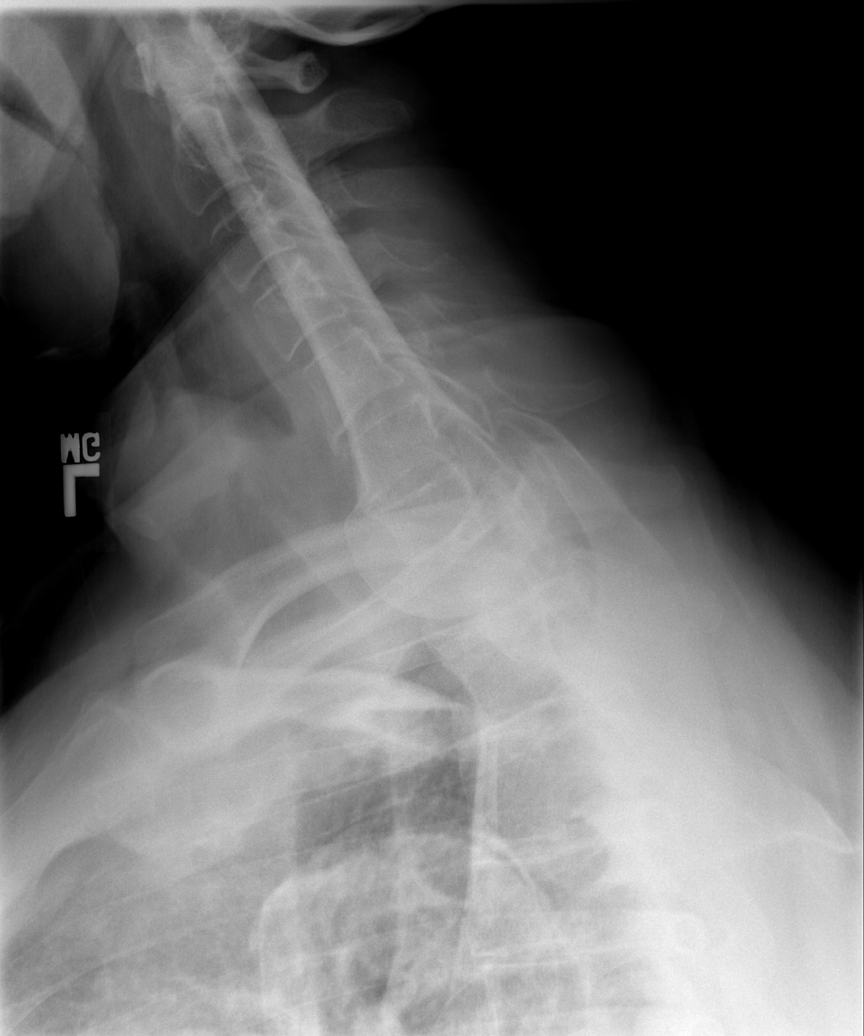

[w c-spine a.p. *]
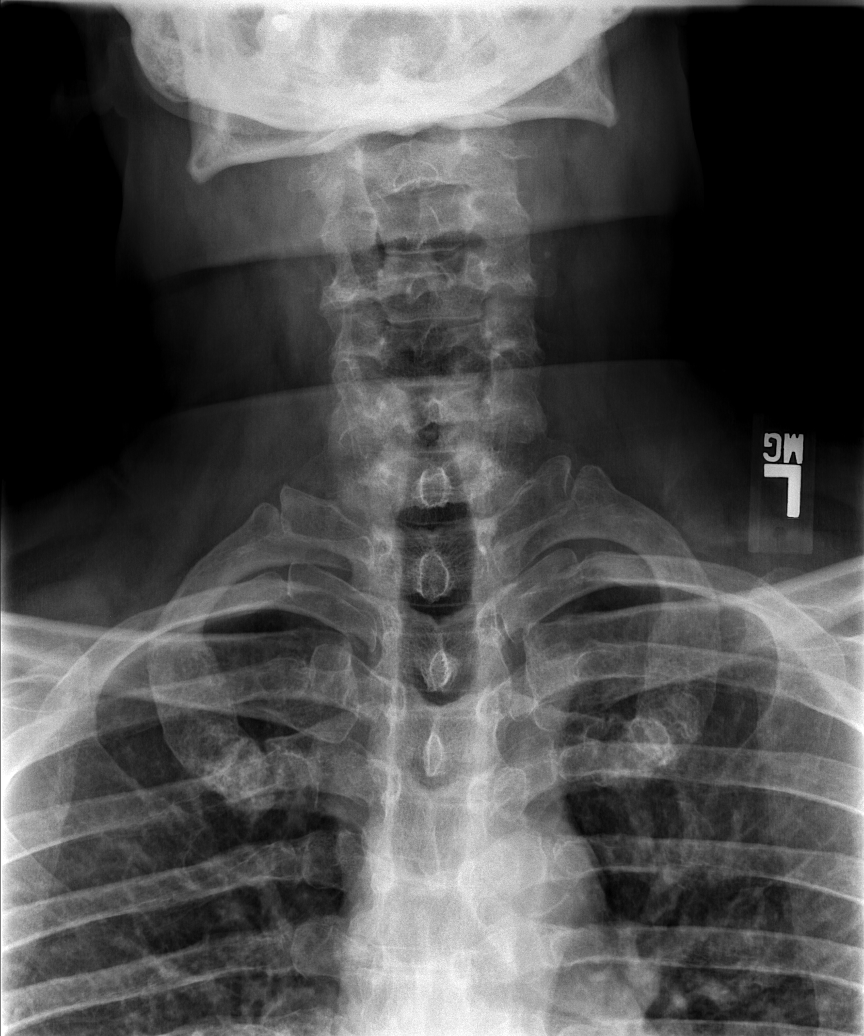

[w c-spine odontoid *]
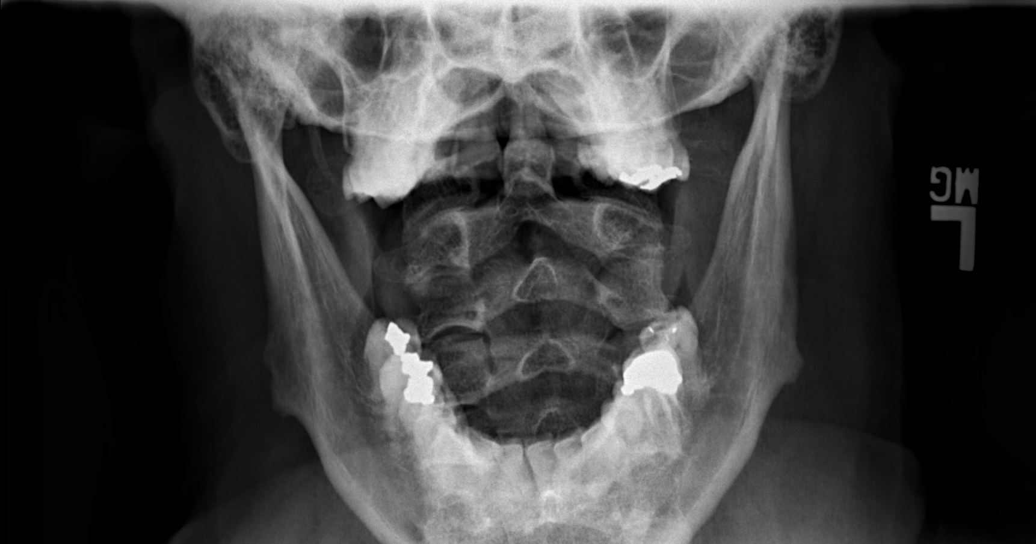

[5 of 5 positions shown; findings below may reference images not displayed]

FINDINGS: Degenerative disc space narrowing is at the C6-C7 level
and to a lesser extent at the C5-C6 level.  There are anterior
osteophytic changes noted at these levels.  There are no
subluxations or destructive changes.  The C1-C2 articulation has a
normal appearance.
IMPRESSION: Degenerative disc changes noted at the C5-C6 and C6-C7 levels as
discussed above.

## 2012-09-21 ENCOUNTER — Other Ambulatory Visit: Payer: Self-pay | Admitting: Internal Medicine

## 2012-10-02 ENCOUNTER — Other Ambulatory Visit: Payer: Self-pay | Admitting: Internal Medicine

## 2012-12-27 ENCOUNTER — Other Ambulatory Visit: Payer: Self-pay | Admitting: Internal Medicine

## 2013-01-20 ENCOUNTER — Other Ambulatory Visit (INDEPENDENT_AMBULATORY_CARE_PROVIDER_SITE_OTHER): Payer: BC Managed Care – PPO

## 2013-01-20 ENCOUNTER — Telehealth: Payer: Self-pay

## 2013-01-20 DIAGNOSIS — E119 Type 2 diabetes mellitus without complications: Secondary | ICD-10-CM

## 2013-01-20 DIAGNOSIS — E782 Mixed hyperlipidemia: Secondary | ICD-10-CM

## 2013-01-20 DIAGNOSIS — I10 Essential (primary) hypertension: Secondary | ICD-10-CM

## 2013-01-20 LAB — HEMOGLOBIN A1C: Hgb A1c MFr Bld: 7.2 % — ABNORMAL HIGH (ref 4.6–6.5)

## 2013-01-20 LAB — LIPID PANEL
Cholesterol: 150 mg/dL (ref 0–200)
HDL: 31.4 mg/dL — ABNORMAL LOW (ref >39.00)
Triglycerides: 232 mg/dL — ABNORMAL HIGH (ref 0.0–149.0)

## 2013-01-20 LAB — LDL CHOLESTEROL, DIRECT: Direct LDL: 92.1 mg/dL

## 2013-01-20 LAB — BASIC METABOLIC PANEL
Calcium: 9 mg/dL (ref 8.4–10.5)
Chloride: 105 mEq/L (ref 96–112)
Creatinine, Ser: 1.2 mg/dL (ref 0.4–1.5)
Sodium: 138 mEq/L (ref 135–145)

## 2013-01-20 NOTE — Telephone Encounter (Signed)
Labs entered.

## 2013-01-25 ENCOUNTER — Ambulatory Visit (INDEPENDENT_AMBULATORY_CARE_PROVIDER_SITE_OTHER): Payer: BC Managed Care – PPO | Admitting: Internal Medicine

## 2013-01-25 ENCOUNTER — Encounter: Payer: Self-pay | Admitting: Internal Medicine

## 2013-01-25 ENCOUNTER — Ambulatory Visit: Payer: BC Managed Care – PPO | Admitting: Internal Medicine

## 2013-01-25 VITALS — BP 122/82 | HR 70 | Temp 98.0°F | Ht 72.0 in | Wt 310.0 lb

## 2013-01-25 DIAGNOSIS — G4730 Sleep apnea, unspecified: Secondary | ICD-10-CM

## 2013-01-25 DIAGNOSIS — Z0000 Encounter for general adult medical examination without abnormal findings: Secondary | ICD-10-CM

## 2013-01-25 DIAGNOSIS — E785 Hyperlipidemia, unspecified: Secondary | ICD-10-CM

## 2013-01-25 DIAGNOSIS — Z23 Encounter for immunization: Secondary | ICD-10-CM

## 2013-01-25 DIAGNOSIS — E119 Type 2 diabetes mellitus without complications: Secondary | ICD-10-CM

## 2013-01-25 DIAGNOSIS — I10 Essential (primary) hypertension: Secondary | ICD-10-CM

## 2013-01-25 NOTE — Progress Notes (Signed)
Subjective:    Patient ID: Joseph Maxwell, male    DOB: 04-Dec-1955, 57 y.o.   MRN: [id number]  HPI  Here to f/u; overall doing ok,  Pt denies chest pain, increased sob or doe, wheezing, orthopnea, PND, increased LE swelling, palpitations, dizziness or syncope.  Pt denies polydipsia, polyuria, or low sugar symptoms such as weakness or confusion improved with po intake.  Pt denies new neurological symptoms such as new headache, or facial or extremity weakness or numbness.   Pt states overall good compliance with meds, has been trying to follow lower cholesterol, diabetic diet, with wt overall stable,  but little exercise however.  CBG's usually in the low 100's, recently 113 and 107.  Not using CPAP every night, but plans to try to do better, sleep better on stomach but maks is uncomfortable in that position.    Due for flu shot today Past Medical History  Diagnosis Date  . ALLERGIC RHINITIS   . BACK PAIN   . DIABETES MELLITUS   . DIVERTICULOSIS, COLON   . ERECTILE DYSFUNCTION   . FAMILIAL TREMOR   . GERD   . HYPERLIPIDEMIA   . HYPERTENSION   . OBSTRUCTIVE SLEEP APNEA   . ONYCHOMYCOSIS, TOENAILS   . PLANTAR FASCIITIS, LEFT   . Unspecified asthma(493.90)   . Cervical disc disease    Past Surgical History  Procedure Laterality Date  . Neck surgery  04/08/10    2 fusion and disctecomy/Dr. Wynetta Emery    reports that he has never smoked. He does not have any smokeless tobacco history on file. He reports that  drinks alcohol. He reports that he does not use illicit drugs. family history includes Diabetes in his father, mother, and sister; Hypertension in his other; Lung cancer in his other; Prostate cancer in his father; Sleep apnea in his mother. Allergies  Allergen Reactions  . Hydrocodone     REACTION: anxiety reaction   Current Outpatient Prescriptions on File Prior to Visit  Medication Sig Dispense Refill  . albuterol (PROVENTIL,VENTOLIN) 90 MCG/ACT inhaler Inhale 2 puffs into the lungs 4  (four) times daily.  17 g  5  . aspirin 81 MG tablet Take 81 mg by mouth daily.        Marland Kitchen atenolol (TENORMIN) 25 MG tablet TAKE ONE TABLET BY MOUTH ONE TIME DAILY  30 tablet  11  . atorvastatin (LIPITOR) 40 MG tablet TAKE ONE TABLET BY MOUTH EVERY DAY  30 tablet  11  . glipiZIDE (GLUCOTROL XL) 2.5 MG 24 hr tablet Take 1 tablet (2.5 mg total) by mouth daily.  90 tablet  3  . glucose blood (FREESTYLE LITE) test strip Use as directed 1 per day  100 each  11  . JANUVIA 100 MG tablet Take one tablet by mouth one time daily  30 tablet  11  . Lancets (FREESTYLE) lancets Use as directed 1 per day  100 each  3  . lisinopril-hydrochlorothiazide (PRINZIDE,ZESTORETIC) 20-12.5 MG per tablet TAKE TWO TABLETS BY MOUTH DAILY  180 tablet  2  . loratadine (CLARITIN) 10 MG tablet Take 10 mg by mouth as needed.        . metFORMIN (GLUCOPHAGE) 500 MG tablet TAKE TWO TABLETS BY MOUTH TWICE DAILY WITH MEALS  120 tablet  11  . niacin 500 MG tablet Take 3 tablets (1,500 mg total) by mouth daily with breakfast.  270 tablet  3  . omeprazole (PRILOSEC) 20 MG capsule TAKE ONE CAPSULE BY MOUTH EVERY DAY  90  capsule  3   No current facility-administered medications on file prior to visit.    Review of Systems  Constitutional: Negative for unexpected weight change, or unusual diaphoresis  HENT: Negative for tinnitus.   Eyes: Negative for photophobia and visual disturbance.  Respiratory: Negative for choking and stridor.   Gastrointestinal: Negative for vomiting and blood in stool.  Genitourinary: Negative for hematuria and decreased urine volume.  Musculoskeletal: Negative for acute joint swelling Skin: Negative for color change and wound.  Neurological: Negative for tremors and numbness other than noted  Psychiatric/Behavioral: Negative for decreased concentration or  hyperactivity.       Objective:   Physical Exam BP 122/82  Pulse 70  Temp(Src) 98 F (36.7 C) (Oral)  Ht 6' (1.829 m)  Wt 310 lb (140.615 kg)   BMI 42.03 kg/m2  SpO2 97% VS noted,  Constitutional: Pt appears well-developed and well-nourished.  HENT: Head: NCAT.  Right Ear: External ear normal.  Left Ear: External ear normal.  Eyes: Conjunctivae and EOM are normal. Pupils are equal, round, and reactive to light.  Neck: Normal range of motion. Neck supple.  Cardiovascular: Normal rate and regular rhythm.   Pulmonary/Chest: Effort normal and breath sounds normal.  Abd:  Soft, NT, non-distended, + BS Neurological: Pt is alert. Not confused  Skin: Skin is warm. No erythema.  Psychiatric: Pt behavior is normal. Thought content normal.     Assessment & Plan:

## 2013-01-25 NOTE — Assessment & Plan Note (Addendum)
Encourage use of CPAP nightly, as can reduce risk of HTN and stroke, cont efforts as at loss

## 2013-01-25 NOTE — Assessment & Plan Note (Signed)
stable overall by history and exam, recent data reviewed with pt, and pt to continue medical treatment as before,  to f/u any worsening symptoms or concerns Lab Results  Component Value Date   HGBA1C 7.2* 01/20/2013

## 2013-01-25 NOTE — Patient Instructions (Signed)
You had the flu shot today Your lab work is given copy to you today No medication or other treatment changes at this time Please continue your efforts at being more active, low cholesterol diabeitc diet, and weight control. Please continue all other medications as before, and refills have been done if requested. Please have the pharmacy call with any other refills you may need.  Please remember to sign up for My Chart if you have not done so, as this will be important to you in the future with finding out test results, communicating by private email, and scheduling acute appointments online when needed.  Please return in 6 months, or sooner if needed, with Lab testing done 3-5 days before

## 2013-01-25 NOTE — Assessment & Plan Note (Signed)
stable overall by history and exam, recent data reviewed with pt, and pt to continue medical treatment as before,  to f/u any worsening symptoms or concerns Lab Results  Component Value Date   LDLCALC 82 06/09/2011

## 2013-01-25 NOTE — Assessment & Plan Note (Signed)
stable overall by history and exam, recent data reviewed with pt, and pt to continue medical treatment as before,  to f/u any worsening symptoms or concerns BP Readings from Last 3 Encounters:  01/25/13 122/82  07/23/12 120/78  10/16/11 110/72

## 2013-03-31 ENCOUNTER — Other Ambulatory Visit: Payer: Self-pay | Admitting: Internal Medicine

## 2013-07-15 ENCOUNTER — Other Ambulatory Visit: Payer: Self-pay | Admitting: Internal Medicine

## 2013-07-19 ENCOUNTER — Other Ambulatory Visit (INDEPENDENT_AMBULATORY_CARE_PROVIDER_SITE_OTHER): Payer: BC Managed Care – PPO

## 2013-07-19 ENCOUNTER — Other Ambulatory Visit: Payer: Self-pay

## 2013-07-19 DIAGNOSIS — Z0000 Encounter for general adult medical examination without abnormal findings: Secondary | ICD-10-CM

## 2013-07-19 DIAGNOSIS — E119 Type 2 diabetes mellitus without complications: Secondary | ICD-10-CM

## 2013-07-19 LAB — URINALYSIS, ROUTINE W REFLEX MICROSCOPIC
Hgb urine dipstick: NEGATIVE
Leukocytes, UA: NEGATIVE
Nitrite: NEGATIVE
PH: 5.5 (ref 5.0–8.0)
RBC / HPF: NONE SEEN (ref 0–?)
Total Protein, Urine: NEGATIVE
URINE GLUCOSE: NEGATIVE
Urobilinogen, UA: 0.2 (ref 0.0–1.0)
WBC, UA: NONE SEEN (ref 0–?)

## 2013-07-19 LAB — HEPATIC FUNCTION PANEL
ALK PHOS: 90 U/L (ref 39–117)
ALT: 31 U/L (ref 0–53)
AST: 33 U/L (ref 0–37)
Albumin: 4.1 g/dL (ref 3.5–5.2)
Bilirubin, Direct: 0.1 mg/dL (ref 0.0–0.3)
Total Bilirubin: 0.6 mg/dL (ref 0.3–1.2)
Total Protein: 7.3 g/dL (ref 6.0–8.3)

## 2013-07-19 LAB — CBC WITH DIFFERENTIAL/PLATELET
Basophils Absolute: 0.1 10*3/uL (ref 0.0–0.1)
Basophils Relative: 0.6 % (ref 0.0–3.0)
EOS PCT: 3.4 % (ref 0.0–5.0)
Eosinophils Absolute: 0.3 10*3/uL (ref 0.0–0.7)
HEMATOCRIT: 38.7 % — AB (ref 39.0–52.0)
HEMOGLOBIN: 12.8 g/dL — AB (ref 13.0–17.0)
Lymphocytes Relative: 22.7 % (ref 12.0–46.0)
Lymphs Abs: 2.2 10*3/uL (ref 0.7–4.0)
MCHC: 33.1 g/dL (ref 30.0–36.0)
MCV: 80.9 fl (ref 78.0–100.0)
Monocytes Absolute: 0.8 10*3/uL (ref 0.1–1.0)
Monocytes Relative: 8.3 % (ref 3.0–12.0)
NEUTROS ABS: 6.3 10*3/uL (ref 1.4–7.7)
Neutrophils Relative %: 65 % (ref 43.0–77.0)
Platelets: 158 10*3/uL (ref 150.0–400.0)
RBC: 4.78 Mil/uL (ref 4.22–5.81)
RDW: 14.6 % (ref 11.5–14.6)
WBC: 9.6 10*3/uL (ref 4.5–10.5)

## 2013-07-19 LAB — HEMOGLOBIN A1C: HEMOGLOBIN A1C: 8.4 % — AB (ref 4.6–6.5)

## 2013-07-19 LAB — BASIC METABOLIC PANEL
BUN: 29 mg/dL — AB (ref 6–23)
CALCIUM: 9.1 mg/dL (ref 8.4–10.5)
CO2: 27 mEq/L (ref 19–32)
Chloride: 104 mEq/L (ref 96–112)
Creatinine, Ser: 1.2 mg/dL (ref 0.4–1.5)
GFR: 64.08 mL/min (ref >60.00)
GLUCOSE: 166 mg/dL — AB (ref 70–99)
Potassium: 4.3 mEq/L (ref 3.5–5.1)
SODIUM: 139 meq/L (ref 135–145)

## 2013-07-19 LAB — MICROALBUMIN / CREATININE URINE RATIO
CREATININE, U: 360.8 mg/dL
MICROALB UR: 1.2 mg/dL (ref 0.0–1.9)
MICROALB/CREAT RATIO: 0.3 mg/g (ref 0.0–30.0)

## 2013-07-19 LAB — LIPID PANEL
Cholesterol: 166 mg/dL (ref 0–200)
HDL: 32.6 mg/dL — ABNORMAL LOW (ref >39.00)
LDL Cholesterol: 67 mg/dL (ref 0–99)
Total CHOL/HDL Ratio: 5
Triglycerides: 334 mg/dL — ABNORMAL HIGH (ref 0.0–149.0)
VLDL: 66.8 mg/dL — ABNORMAL HIGH (ref 0.0–40.0)

## 2013-07-19 LAB — TSH: TSH: 3.78 u[IU]/mL (ref 0.35–5.50)

## 2013-07-19 LAB — PSA: PSA: 1.09 ng/mL (ref 0.10–4.00)

## 2013-07-19 MED ORDER — FREESTYLE LANCETS MISC: Status: DC

## 2013-07-19 MED ORDER — GLUCOSE BLOOD VI STRP: ORAL_STRIP | Status: DC

## 2013-07-26 ENCOUNTER — Encounter: Payer: Self-pay | Admitting: Internal Medicine

## 2013-07-26 ENCOUNTER — Ambulatory Visit (INDEPENDENT_AMBULATORY_CARE_PROVIDER_SITE_OTHER): Payer: BC Managed Care – PPO | Admitting: Internal Medicine

## 2013-07-26 VITALS — BP 122/80 | HR 71 | Temp 97.0°F | Ht 72.0 in | Wt 314.5 lb

## 2013-07-26 DIAGNOSIS — IMO0001 Reserved for inherently not codable concepts without codable children: Secondary | ICD-10-CM

## 2013-07-26 DIAGNOSIS — E119 Type 2 diabetes mellitus without complications: Secondary | ICD-10-CM

## 2013-07-26 DIAGNOSIS — Z0000 Encounter for general adult medical examination without abnormal findings: Secondary | ICD-10-CM

## 2013-07-26 DIAGNOSIS — E1165 Type 2 diabetes mellitus with hyperglycemia: Secondary | ICD-10-CM

## 2013-07-26 MED ORDER — OMEPRAZOLE 20 MG PO CPDR: 20.0000 mg | DELAYED_RELEASE_CAPSULE | Freq: Every day | ORAL | Status: DC

## 2013-07-26 MED ORDER — ATORVASTATIN CALCIUM 40 MG PO TABS: 40.0000 mg | ORAL_TABLET | Freq: Every day | ORAL | Status: DC

## 2013-07-26 MED ORDER — GLIPIZIDE ER 5 MG PO TB24: 5.0000 mg | ORAL_TABLET | Freq: Every day | ORAL | Status: DC

## 2013-07-26 NOTE — Assessment & Plan Note (Signed)
Uncontrolled, to incr the glipizide eR to 5 mg qd, work on wt loss and diet

## 2013-07-26 NOTE — Progress Notes (Signed)
Pre visit review using our clinic review tool, if applicable. No additional management support is needed unless otherwise documented below in the visit note. 

## 2013-07-26 NOTE — Patient Instructions (Addendum)
Your EKG was OK today OK to increase the glipizide ER to 5 mg per day  Please continue all other medications as before, and refills have been done if requested. Please have the pharmacy call with any other refills you may need.  Please continue your efforts at being more active, low cholesterol diet, and weight control. You are otherwise up to date with prevention measures today. Please keep your appointments with your specialists as you may have planned  Please remember to sign up for MyChart if you have not done so, as this will be important to you in the future with finding out test results, communicating by private email, and scheduling acute appointments online when needed.  Please return in 6 months, or sooner if needed, with Lab testing done 3-5 days before

## 2013-07-26 NOTE — Progress Notes (Signed)
Subjective:    Patient ID: Joseph Maxwell, male    DOB: [birth year], 58 y.o.   MRN: [id number]  HPI Here for wellness and f/u;  Overall doing ok;  Pt denies CP, worsening SOB, DOE, wheezing, orthopnea, PND, worsening LE edema, palpitations, dizziness or syncope.  Pt denies neurological change such as new headache, facial or extremity weakness.  Pt denies polydipsia, polyuria, or low sugar symptoms. Pt states overall good compliance with treatment and medications, good tolerability, and has been trying to follow lower cholesterol diet.  Pt denies worsening depressive symptoms, suicidal ideation or panic. No fever, night sweats, wt loss, loss of appetite, or other constitutional symptoms.  Pt states good ability with ADL's, has low fall risk, home safety reviewed and adequate, no other significant changes in hearing or vision, and only occasionally active with exercise.  Admits to some dietary noncompliance, plans to do better.,  Sister had recent gastric bypass.  Mother with recent pacemaker now 56yo Past Medical History  Diagnosis Date  . ALLERGIC RHINITIS   . BACK PAIN   . DIABETES MELLITUS   . DIVERTICULOSIS, COLON   . ERECTILE DYSFUNCTION   . FAMILIAL TREMOR   . GERD   . HYPERLIPIDEMIA   . HYPERTENSION   . OBSTRUCTIVE SLEEP APNEA   . ONYCHOMYCOSIS, TOENAILS   . PLANTAR FASCIITIS, LEFT   . Unspecified asthma(493.90)   . Cervical disc disease    Past Surgical History  Procedure Laterality Date  . Neck surgery  04/08/10    2 fusion and disctecomy/Dr. Wynetta Emeryram    reports that he has never smoked. He does not have any smokeless tobacco history on file. He reports that he drinks alcohol. He reports that he does not use illicit drugs. family history includes Diabetes in his father, mother, and sister; Hypertension in his other; Lung cancer in his other; Prostate cancer in his father; Sleep apnea in his mother. Allergies  Allergen Reactions  . Hydrocodone     REACTION: anxiety reaction    / Current Outpatient Prescriptions on File Prior to Visit  Medication Sig Dispense Refill  . albuterol (PROVENTIL,VENTOLIN) 90 MCG/ACT inhaler Inhale 2 puffs into the lungs 4 (four) times daily.  17 g  5  . aspirin 81 MG tablet Take 81 mg by mouth daily.        Marland Kitchen. atenolol (TENORMIN) 25 MG tablet TAKE ONE TABLET BY MOUTH ONE TIME DAILY  30 tablet  11  . glucose blood (FREESTYLE LITE) test strip Use as directed 1 per day  100 each  11  . JANUVIA 100 MG tablet Take one tablet by mouth one time daily  30 tablet  11  . Lancets (FREESTYLE) lancets Use as directed 1 per day  100 each  3  . Lancets (FREESTYLE) lancets Use as instructed once daily to check blood sugar.  Diagnosis code 250.00  100 each  11  . lisinopril-hydrochlorothiazide (PRINZIDE,ZESTORETIC) 20-12.5 MG per tablet Take two tablets by mouth daily  180 tablet  1  . loratadine (CLARITIN) 10 MG tablet Take 10 mg by mouth as needed.        . metFORMIN (GLUCOPHAGE) 500 MG tablet TAKE TWO TABLETS BY MOUTH TWICE DAILY WITH MEALS  120 tablet  11  . niacin 500 MG tablet Take 3 tablets (1,500 mg total) by mouth daily with breakfast.  270 tablet  3  . VENTOLIN HFA 108 (90 BASE) MCG/ACT inhaler Inhale two puffs by mouth four times daily as needed  18  g  10   No current facility-administered medications on file prior to visit.   Review of Systems Constitutional: Negative for diaphoresis, activity change, appetite change or unexpected weight change.  HENT: Negative for hearing loss, ear pain, facial swelling, mouth sores and neck stiffness.   Eyes: Negative for pain, redness and visual disturbance.  Respiratory: Negative for shortness of breath and wheezing.   Cardiovascular: Negative for chest pain and palpitations.  Gastrointestinal: Negative for diarrhea, blood in stool, abdominal distention or other pain Genitourinary: Negative for hematuria, flank pain or change in urine volume.  Musculoskeletal: Negative for myalgias and joint swelling.   Skin: Negative for color change and wound.  Neurological: Negative for syncope and numbness. other than noted Hematological: Negative for adenopathy.  Psychiatric/Behavioral: Negative for hallucinations, self-injury, decreased concentration and agitation.      Objective:   Physical Exam BP 122/80  Pulse 71  Temp(Src) 97 F (36.1 C) (Oral)  Ht 6' (1.829 m)  Wt 314 lb 8 oz (142.656 kg)  BMI 42.64 kg/m2  SpO2 97% VS noted,  Constitutional: Pt is oriented to person, place, and time. Appears well-developed and well-nourished.  Head: Normocephalic and atraumatic.  Right Ear: External ear normal.  Left Ear: External ear normal.  Nose: Nose normal.  Mouth/Throat: Oropharynx is clear and moist.  Eyes: Conjunctivae and EOM are normal. Pupils are equal, round, and reactive to light.  Neck: Normal range of motion. Neck supple. No JVD present. No tracheal deviation present.  Cardiovascular: Normal rate, regular rhythm, normal heart sounds and intact distal pulses.   Pulmonary/Chest: Effort normal and breath sounds normal.  Abdominal: Soft. Bowel sounds are normal. There is no tenderness. No HSM  Musculoskeletal: Normal range of motion. Exhibits no edema.  Lymphadenopathy:  Has no cervical adenopathy.  Neurological: Pt is alert and oriented to person, place, and time. Pt has normal reflexes. No cranial nerve deficit.  Skin: Skin is warm and dry. No rash noted.  Psychiatric:  Has  normal mood and affect. Behavior is normal.     Assessment & Plan:

## 2013-07-26 NOTE — Assessment & Plan Note (Signed)

## 2013-07-29 ENCOUNTER — Telehealth: Payer: Self-pay

## 2013-07-29 ENCOUNTER — Telehealth: Payer: Self-pay | Admitting: Internal Medicine

## 2013-07-29 NOTE — Telephone Encounter (Signed)
Patient called and will pickup today.

## 2013-07-29 NOTE — Telephone Encounter (Signed)
Called the patient to inform to pickup completed [employer] Form at the front desk.

## 2013-07-29 NOTE — Telephone Encounter (Signed)
Robin to have form ready at front desk - Done hardcopy to [place]ro[place]

## 2013-08-04 ENCOUNTER — Telehealth: Payer: Self-pay

## 2013-08-04 NOTE — Telephone Encounter (Signed)
Relevant patient education assigned to patient using Emmi. ° °

## 2013-10-17 ENCOUNTER — Encounter: Payer: Self-pay | Admitting: Internal Medicine

## 2013-10-17 ENCOUNTER — Other Ambulatory Visit: Payer: Self-pay | Admitting: Internal Medicine

## 2013-11-04 ENCOUNTER — Encounter: Payer: Self-pay | Admitting: Internal Medicine

## 2013-11-04 ENCOUNTER — Other Ambulatory Visit: Payer: Self-pay

## 2013-11-04 MED ORDER — FREESTYLE LANCETS MISC: Status: DC

## 2013-11-04 MED ORDER — GLUCOSE BLOOD VI STRP: ORAL_STRIP | Status: DC

## 2013-11-21 ENCOUNTER — Telehealth: Payer: Self-pay

## 2013-11-21 NOTE — Telephone Encounter (Signed)
Received request from pharmacy to change test strips (Freestyle Lite) to preferred [employer] Lif[employer].  Advise on change.

## 2013-11-22 ENCOUNTER — Encounter: Payer: Self-pay | Admitting: Internal Medicine

## 2013-11-22 NOTE — Telephone Encounter (Signed)
Received form, completed, [facility] P[facility] sign [facility] [phone].  Called the patient informed received form and will fax for approval, put URGENT on top of form.

## 2013-11-22 NOTE — Telephone Encounter (Signed)
Called the patient and he is dropping a form by to fax for freestyle lite (test twice daily).  He has a $50 dollar coupon for the freestyle lite and thinks will make affordable for him.

## 2013-11-22 NOTE — Telephone Encounter (Signed)
Please let pt know, and ask if we can send rx for bayer lifescan meter and strips to pharmacy, as apparetnly this is preferred by his insurance

## 2013-11-24 NOTE — Telephone Encounter (Signed)
Received PA Approval for FreeStyle Lite Test Strips today November 24, 2013 from November 23, 2013 through May 11, 2038!Marland Kitchen.... Called the patient informed Strips have been approved.

## 2014-01-20 ENCOUNTER — Other Ambulatory Visit: Payer: Self-pay

## 2014-01-20 ENCOUNTER — Encounter: Payer: Self-pay | Admitting: Internal Medicine

## 2014-01-20 MED ORDER — LISINOPRIL-HYDROCHLOROTHIAZIDE 20-12.5 MG PO TABS: 2.0000 | ORAL_TABLET | Freq: Every day | ORAL | Status: DC

## 2014-01-20 MED ORDER — SITAGLIPTIN PHOSPHATE 100 MG PO TABS: 100.0000 mg | ORAL_TABLET | Freq: Every day | ORAL | Status: DC

## 2014-01-24 ENCOUNTER — Other Ambulatory Visit (INDEPENDENT_AMBULATORY_CARE_PROVIDER_SITE_OTHER): Payer: BC Managed Care – PPO

## 2014-01-24 DIAGNOSIS — E1165 Type 2 diabetes mellitus with hyperglycemia: Principal | ICD-10-CM

## 2014-01-24 DIAGNOSIS — R7989 Other specified abnormal findings of blood chemistry: Secondary | ICD-10-CM

## 2014-01-24 DIAGNOSIS — IMO0001 Reserved for inherently not codable concepts without codable children: Secondary | ICD-10-CM

## 2014-01-24 LAB — HEPATIC FUNCTION PANEL
ALK PHOS: 74 U/L (ref 39–117)
ALT: 18 U/L (ref 0–53)
AST: 18 U/L (ref 0–37)
Albumin: 3.8 g/dL (ref 3.5–5.2)
BILIRUBIN DIRECT: 0.1 mg/dL (ref 0.0–0.3)
TOTAL PROTEIN: 7.6 g/dL (ref 6.0–8.3)
Total Bilirubin: 0.5 mg/dL (ref 0.2–1.2)

## 2014-01-24 LAB — LIPID PANEL
CHOL/HDL RATIO: 6
Cholesterol: 160 mg/dL (ref 0–200)
HDL: 26.8 mg/dL — ABNORMAL LOW (ref >39.00)
NonHDL: 133.2
Triglycerides: 269 mg/dL — ABNORMAL HIGH (ref 0.0–149.0)
VLDL: 53.8 mg/dL — ABNORMAL HIGH (ref 0.0–40.0)

## 2014-01-24 LAB — BASIC METABOLIC PANEL
BUN: 19 mg/dL (ref 6–23)
CHLORIDE: 104 meq/L (ref 96–112)
CO2: 26 meq/L (ref 19–32)
Calcium: 9.2 mg/dL (ref 8.4–10.5)
Creatinine, Ser: 1.2 mg/dL (ref 0.4–1.5)
GFR: 65.8 mL/min (ref >60.00)
Glucose, Bld: 113 mg/dL — ABNORMAL HIGH (ref 70–99)
Potassium: 4.1 mEq/L (ref 3.5–5.1)
SODIUM: 138 meq/L (ref 135–145)

## 2014-01-24 LAB — HEMOGLOBIN A1C: Hgb A1c MFr Bld: 6.3 % (ref 4.6–6.5)

## 2014-01-24 LAB — LDL CHOLESTEROL, DIRECT: Direct LDL: 103.3 mg/dL

## 2014-01-31 ENCOUNTER — Encounter: Payer: Self-pay | Admitting: Internal Medicine

## 2014-01-31 ENCOUNTER — Ambulatory Visit (INDEPENDENT_AMBULATORY_CARE_PROVIDER_SITE_OTHER): Payer: BC Managed Care – PPO | Admitting: Internal Medicine

## 2014-01-31 VITALS — BP 132/82 | HR 75 | Temp 98.9°F | Ht 72.0 in | Wt 299.5 lb

## 2014-01-31 DIAGNOSIS — Z0000 Encounter for general adult medical examination without abnormal findings: Secondary | ICD-10-CM

## 2014-01-31 DIAGNOSIS — E119 Type 2 diabetes mellitus without complications: Secondary | ICD-10-CM

## 2014-01-31 DIAGNOSIS — E785 Hyperlipidemia, unspecified: Secondary | ICD-10-CM

## 2014-01-31 DIAGNOSIS — I10 Essential (primary) hypertension: Secondary | ICD-10-CM

## 2014-01-31 DIAGNOSIS — Z23 Encounter for immunization: Secondary | ICD-10-CM

## 2014-01-31 NOTE — Patient Instructions (Addendum)
You had the flu shot today  Please continue all other medications as before, and refills have been done if requested.  Please have the pharmacy call with any other refills you may need.  Please continue your efforts at being more active, low cholesterol diet, and weight control.  You are otherwise up to date with prevention measures today.  Please keep your appointments with your specialists as you may have planned  Please return in 6 months, or sooner if needed, with Lab testing done 3-5 days before  

## 2014-01-31 NOTE — Progress Notes (Signed)
Pre visit review using our clinic review tool, if applicable. No additional management support is needed unless otherwise documented below in the visit note. 

## 2014-01-31 NOTE — Assessment & Plan Note (Signed)
stable overall by history and exam, recent data reviewed with pt, and pt to continue medical treatment as before,  to f/u any worsening symptoms or concerns Lab Results  Component Value Date   LDLCALC 67 07/19/2013

## 2014-01-31 NOTE — Assessment & Plan Note (Signed)
Excellent overall control, stable overall by history and exam, recent data reviewed with pt, and pt to continue medical treatment as before,  to f/u any worsening symptoms or concerns

## 2014-01-31 NOTE — Progress Notes (Signed)
Subjective:    Patient ID: Joseph Maxwell, male    DOB: 1955-09-29, 58 y.o.   MRN: [id number]  HPI  Here to f/u; overall doing ok,  Pt denies chest pain, increased sob or doe, wheezing, orthopnea, PND, increased LE swelling, palpitations, dizziness or syncope.  Pt denies polydipsia, polyuria, or low sugar symptoms such as weakness or confusion improved with po intake.  Pt denies new neurological symptoms such as new headache, or facial or extremity weakness or numbness.   Pt states overall good compliance with meds, has been trying to followbetter  lower cholesterol, diabetic diet, with wt overall down 10 lbs,  but little exercise however.  CBG's in the low 100's.   Past Medical History  Diagnosis Date  . ALLERGIC RHINITIS   . BACK PAIN   . DIABETES MELLITUS   . DIVERTICULOSIS, COLON   . ERECTILE DYSFUNCTION   . FAMILIAL TREMOR   . GERD   . HYPERLIPIDEMIA   . HYPERTENSION   . OBSTRUCTIVE SLEEP APNEA   . ONYCHOMYCOSIS, TOENAILS   . PLANTAR FASCIITIS, LEFT   . Unspecified asthma(493.90)   . Cervical disc disease    Past Surgical History  Procedure Laterality Date  . Neck surgery  04/08/10    2 fusion and disctecomy/Dr. Wynetta Emery    reports that he has never smoked. He does not have any smokeless tobacco history on file. He reports that he drinks alcohol. He reports that he does not use illicit drugs. family history includes Diabetes in his father, mother, and sister; Hypertension in his other; Lung cancer in his other; Prostate cancer in his father; Sleep apnea in his mother. Allergies  Allergen Reactions  . Hydrocodone     REACTION: anxiety reaction   Current Outpatient Prescriptions on File Prior to Visit  Medication Sig Dispense Refill  . albuterol (PROVENTIL,VENTOLIN) 90 MCG/ACT inhaler Inhale 2 puffs into the lungs 4 (four) times daily.  17 g  5  . aspirin 81 MG tablet Take 81 mg by mouth daily.        Marland Kitchen atenolol (TENORMIN) 25 MG tablet Take one tablet by mouth one time daily   30 tablet  10  . atorvastatin (LIPITOR) 40 MG tablet Take 1 tablet (40 mg total) by mouth daily at 6 PM.  90 tablet  3  . glipiZIDE (GLIPIZIDE XL) 5 MG 24 hr tablet Take 1 tablet (5 mg total) by mouth daily with breakfast.  90 tablet  3  . glucose blood (FREESTYLE LITE) test strip Use as directed 1 per day  100 each  11  . Lancets (FREESTYLE) lancets Use as directed 1 per day  100 each  3  . Lancets (FREESTYLE) lancets Use as instructed once daily to check blood sugar.  Diagnosis code 250.00  100 each  11  . lisinopril-hydrochlorothiazide (PRINZIDE,ZESTORETIC) 20-12.5 MG per tablet Take 2 tablets by mouth daily.  180 tablet  3  . loratadine (CLARITIN) 10 MG tablet Take 10 mg by mouth as needed.        . metFORMIN (GLUCOPHAGE) 500 MG tablet Take 2 tablets by mouth twice daily with food.  120 tablet  10  . niacin 500 MG tablet Take 3 tablets (1,500 mg total) by mouth daily with breakfast.  270 tablet  3  . omeprazole (PRILOSEC) 20 MG capsule Take 1 capsule (20 mg total) by mouth daily.  90 capsule  3  . sitaGLIPtin (JANUVIA) 100 MG tablet Take 1 tablet (100 mg total) by mouth  daily.  30 tablet  11  . VENTOLIN HFA 108 (90 BASE) MCG/ACT inhaler Inhale two puffs by mouth four times daily as needed  18 g  10   No current facility-administered medications on file prior to visit.   Review of Systems  Constitutional: Negative for unusual diaphoresis or other sweats  HENT: Negative for ringing in ear Eyes: Negative for double vision or worsening visual disturbance.  Respiratory: Negative for choking and stridor.   Gastrointestinal: Negative for vomiting or other signifcant bowel change Genitourinary: Negative for hematuria or decreased urine volume.  Musculoskeletal: Negative for other MSK pain or swelling Skin: Negative for color change and worsening wound.  Neurological: Negative for tremors and numbness other than noted  Psychiatric/Behavioral: Negative for decreased concentration or agitation  other than above        Objective:   Physical Exam BP 132/82  Pulse 75  Temp(Src) 98.9 F (37.2 C) (Oral)  Ht 6' (1.829 m)  Wt 299 lb 8 oz (135.852 kg)  BMI 40.61 kg/m2  SpO2 96% VS noted,  Constitutional: Pt appears well-developed, well-nourished.  HENT: Head: NCAT.  Right Ear: External ear normal.  Left Ear: External ear normal.  Eyes: . Pupils are equal, round, and reactive to light. Conjunctivae and EOM are normal Neck: Normal range of motion. Neck supple.  Cardiovascular: Normal rate and regular rhythm.   Pulmonary/Chest: Effort normal and breath sounds normal.  Abd:  Soft, NT, ND, + BS Neurological: Pt is alert. Not confused , motor grossly intact Skin: Skin is warm. No rash Psychiatric: Pt behavior is normal. No agitation.      Assessment & Plan:

## 2014-01-31 NOTE — Assessment & Plan Note (Signed)
stable overall by history and exam, recent data reviewed with pt, and pt to continue medical treatment as before,  to f/u any worsening symptoms or concerns BP Readings from Last 3 Encounters:  01/31/14 132/82  07/26/13 122/80  01/25/13 122/82

## 2014-05-30 ENCOUNTER — Other Ambulatory Visit: Payer: Self-pay | Admitting: Internal Medicine

## 2014-07-25 ENCOUNTER — Other Ambulatory Visit (INDEPENDENT_AMBULATORY_CARE_PROVIDER_SITE_OTHER): Payer: BLUE CROSS/BLUE SHIELD

## 2014-07-25 DIAGNOSIS — E119 Type 2 diabetes mellitus without complications: Secondary | ICD-10-CM

## 2014-07-25 DIAGNOSIS — Z0000 Encounter for general adult medical examination without abnormal findings: Secondary | ICD-10-CM | POA: Diagnosis not present

## 2014-07-25 DIAGNOSIS — R7989 Other specified abnormal findings of blood chemistry: Secondary | ICD-10-CM

## 2014-07-25 LAB — TSH: TSH: 3.24 u[IU]/mL (ref 0.35–4.50)

## 2014-07-25 LAB — URINALYSIS, ROUTINE W REFLEX MICROSCOPIC
BILIRUBIN URINE: NEGATIVE
Hgb urine dipstick: NEGATIVE
KETONES UR: NEGATIVE
Leukocytes, UA: NEGATIVE
Nitrite: NEGATIVE
Specific Gravity, Urine: >=1.03 — AB (ref 1.000–1.030)
TOTAL PROTEIN, URINE-UPE24: NEGATIVE
Urine Glucose: NEGATIVE
Urobilinogen, UA: 0.2 (ref 0.0–1.0)
pH: 5.5 (ref 5.0–8.0)

## 2014-07-25 LAB — BASIC METABOLIC PANEL
BUN: 30 mg/dL — AB (ref 6–23)
CHLORIDE: 102 meq/L (ref 96–112)
CO2: 32 meq/L (ref 19–32)
Calcium: 9.2 mg/dL (ref 8.4–10.5)
Creatinine, Ser: 1.39 mg/dL (ref 0.40–1.50)
GFR: 55.97 mL/min — ABNORMAL LOW (ref >60.00)
GLUCOSE: 133 mg/dL — AB (ref 70–99)
POTASSIUM: 5.1 meq/L (ref 3.5–5.1)
Sodium: 139 mEq/L (ref 135–145)

## 2014-07-25 LAB — CBC WITH DIFFERENTIAL/PLATELET
Basophils Absolute: 0.1 10*3/uL (ref 0.0–0.1)
Basophils Relative: 0.8 % (ref 0.0–3.0)
EOS ABS: 0.3 10*3/uL (ref 0.0–0.7)
Eosinophils Relative: 2.9 % (ref 0.0–5.0)
HCT: 38 % — ABNORMAL LOW (ref 39.0–52.0)
Hemoglobin: 12.9 g/dL — ABNORMAL LOW (ref 13.0–17.0)
LYMPHS PCT: 23.4 % (ref 12.0–46.0)
Lymphs Abs: 2.1 10*3/uL (ref 0.7–4.0)
MCHC: 34 g/dL (ref 30.0–36.0)
MCV: 77.8 fl — ABNORMAL LOW (ref 78.0–100.0)
Monocytes Absolute: 0.7 10*3/uL (ref 0.1–1.0)
Monocytes Relative: 8.1 % (ref 3.0–12.0)
NEUTROS PCT: 64.8 % (ref 43.0–77.0)
Neutro Abs: 5.8 10*3/uL (ref 1.4–7.7)
Platelets: 172 10*3/uL (ref 150.0–400.0)
RBC: 4.88 Mil/uL (ref 4.22–5.81)
RDW: 14.8 % (ref 11.5–15.5)
WBC: 9 10*3/uL (ref 4.0–10.5)

## 2014-07-25 LAB — LIPID PANEL
Cholesterol: 165 mg/dL (ref 0–200)
HDL: 29.8 mg/dL — ABNORMAL LOW (ref >39.00)
NonHDL: 135.2
TRIGLYCERIDES: 263 mg/dL — AB (ref 0.0–149.0)
Total CHOL/HDL Ratio: 6
VLDL: 52.6 mg/dL — ABNORMAL HIGH (ref 0.0–40.0)

## 2014-07-25 LAB — HEPATIC FUNCTION PANEL
ALBUMIN: 4.3 g/dL (ref 3.5–5.2)
ALT: 16 U/L (ref 0–53)
AST: 14 U/L (ref 0–37)
Alkaline Phosphatase: 83 U/L (ref 39–117)
Bilirubin, Direct: 0.1 mg/dL (ref 0.0–0.3)
Total Bilirubin: 0.5 mg/dL (ref 0.2–1.2)
Total Protein: 7.1 g/dL (ref 6.0–8.3)

## 2014-07-25 LAB — MICROALBUMIN / CREATININE URINE RATIO
Creatinine,U: 255.9 mg/dL
MICROALB/CREAT RATIO: 0.4 mg/g (ref 0.0–30.0)
Microalb, Ur: 1 mg/dL (ref 0.0–1.9)

## 2014-07-25 LAB — LDL CHOLESTEROL, DIRECT: LDL DIRECT: 94 mg/dL

## 2014-07-25 LAB — PSA: PSA: 1.01 ng/mL (ref 0.10–4.00)

## 2014-07-25 LAB — HEMOGLOBIN A1C: Hgb A1c MFr Bld: 7 % — ABNORMAL HIGH (ref 4.6–6.5)

## 2014-07-28 ENCOUNTER — Ambulatory Visit (INDEPENDENT_AMBULATORY_CARE_PROVIDER_SITE_OTHER): Payer: BLUE CROSS/BLUE SHIELD | Admitting: Internal Medicine

## 2014-07-28 ENCOUNTER — Other Ambulatory Visit (INDEPENDENT_AMBULATORY_CARE_PROVIDER_SITE_OTHER): Payer: BLUE CROSS/BLUE SHIELD

## 2014-07-28 ENCOUNTER — Encounter: Payer: Self-pay | Admitting: Internal Medicine

## 2014-07-28 VITALS — BP 124/70 | HR 69 | Temp 98.1°F | Resp 20 | Ht 72.0 in | Wt 307.0 lb

## 2014-07-28 DIAGNOSIS — D649 Anemia, unspecified: Secondary | ICD-10-CM

## 2014-07-28 DIAGNOSIS — Z0000 Encounter for general adult medical examination without abnormal findings: Secondary | ICD-10-CM

## 2014-07-28 DIAGNOSIS — E119 Type 2 diabetes mellitus without complications: Secondary | ICD-10-CM

## 2014-07-28 LAB — IBC PANEL
IRON: 44 ug/dL (ref 42–165)
Saturation Ratios: 15 % — ABNORMAL LOW (ref 20.0–50.0)
TRANSFERRIN: 210 mg/dL — AB (ref 212.0–360.0)

## 2014-07-28 MED ORDER — ATENOLOL 25 MG PO TABS: 25.0000 mg | ORAL_TABLET | Freq: Every day | ORAL | Status: DC

## 2014-07-28 NOTE — Assessment & Plan Note (Signed)
No overt bleeding, hgb only slightly low, but new microcytic  - for iron panel

## 2014-07-28 NOTE — Progress Notes (Signed)
Subjective:    Patient ID: Joseph EagleCharles Karapetian, male    DOB: [birth year], 57 y.o.   MRN: [id number]  HPI  Here for wellness and f/u;  Overall doing ok;  Pt denies Chest pain, worsening SOB, DOE, wheezing, orthopnea, PND, worsening LE edema, palpitations, dizziness or syncope.  Pt denies neurological change such as new headache, facial or extremity weakness.  Pt denies polydipsia, polyuria, or low sugar symptoms. Pt states overall good compliance with treatment and medications, good tolerability, and has been trying to follow appropriate diet.  Pt denies worsening depressive symptoms, suicidal ideation or panic. No fever, night sweats, wt loss, loss of appetite, or other constitutional symptoms.  Pt states good ability with ADL's, has low fall risk, home safety reviewed and adequate, no other significant changes in hearing or vision, and only occasionally active with exercise. Has been out of his job x 3 yrs now, sort of a malaise now set in,  Wt down however, trying to do better with diet. Wt Readings from Last 3 Encounters:  07/28/14 307 lb (139.254 kg)  01/31/14 299 lb 8 oz (135.852 kg)  07/26/13 314 lb 8 oz (142.656 kg)   Past Medical History  Diagnosis Date  . ALLERGIC RHINITIS   . BACK PAIN   . DIABETES MELLITUS   . DIVERTICULOSIS, COLON   . ERECTILE DYSFUNCTION   . FAMILIAL TREMOR   . GERD   . HYPERLIPIDEMIA   . HYPERTENSION   . OBSTRUCTIVE SLEEP APNEA   . ONYCHOMYCOSIS, TOENAILS   . PLANTAR FASCIITIS, LEFT   . Unspecified asthma(493.90)   . Cervical disc disease    Past Surgical History  Procedure Laterality Date  . Neck surgery  04/08/10    2 fusion and disctecomy/Dr. Wynetta Emeryram    reports that he has never smoked. He does not have any smokeless tobacco history on file. He reports that he drinks alcohol. He reports that he does not use illicit drugs. family history includes Diabetes in his father, mother, and sister; Hypertension in his other; Lung cancer in his other; Prostate cancer  in his father; Sleep apnea in his mother. Allergies  Allergen Reactions  . Hydrocodone     REACTION: anxiety reaction   Current Outpatient Prescriptions on File Prior to Visit  Medication Sig Dispense Refill  . aspirin 81 MG tablet Take 81 mg by mouth daily.      Marland Kitchen. atorvastatin (LIPITOR) 40 MG tablet Take 1 tablet (40 mg total) by mouth daily at 6 PM. 90 tablet 3  . glipiZIDE (GLIPIZIDE XL) 5 MG 24 hr tablet Take 1 tablet (5 mg total) by mouth daily with breakfast. 90 tablet 3  . glucose blood (FREESTYLE LITE) test strip Use as directed 1 per day 100 each 11  . Lancets (FREESTYLE) lancets Use as directed 1 per day 100 each 3  . Lancets (FREESTYLE) lancets Use as instructed once daily to check blood sugar.  Diagnosis code 250.00 100 each 11  . lisinopril-hydrochlorothiazide (PRINZIDE,ZESTORETIC) 20-12.5 MG per tablet Take 2 tablets by mouth daily. 180 tablet 3  . loratadine (CLARITIN) 10 MG tablet Take 10 mg by mouth as needed.      . metFORMIN (GLUCOPHAGE) 500 MG tablet Take 2 tablets by mouth twice daily with food. 120 tablet 10  . niacin 500 MG tablet Take 3 tablets (1,500 mg total) by mouth daily with breakfast. 270 tablet 3  . omeprazole (PRILOSEC) 20 MG capsule Take 1 capsule (20 mg total) by mouth daily. 90 capsule  3  . sitaGLIPtin (JANUVIA) 100 MG tablet Take 1 tablet (100 mg total) by mouth daily. 30 tablet 11  . VENTOLIN HFA 108 (90 BASE) MCG/ACT inhaler INHALE TWO PUFFS BY MOUTH FOUR TIMES DAILY AS NEEDED  (Patient taking differently: INHALE TWO PUFFS BY MOUTH FOUR TIMES DAILY AS NEEDED) 18 g 3   No current facility-administered medications on file prior to visit.    Review of Systems Constitutional: Negative for increased diaphoresis, other activity, appetite or siginficant weight change other than noted HENT: Negative for worsening hearing loss, ear pain, facial swelling, mouth sores and neck stiffness.   Eyes: Negative for other worsening pain, redness or visual disturbance.    Respiratory: Negative for shortness of breath and wheezing  Cardiovascular: Negative for chest pain and palpitations.  Gastrointestinal: Negative for diarrhea, blood in stool, abdominal distention or other pain Genitourinary: Negative for hematuria, flank pain or change in urine volume.  Musculoskeletal: Negative for myalgias or other joint complaints.  Skin: Negative for color change and wound or drainage.  Neurological: Negative for syncope and numbness. other than noted Hematological: Negative for adenopathy. or other swelling Psychiatric/Behavioral: Negative for hallucinations, SI, self-injury, decreased concentration or other worsening agitation.      Objective:   Physical Exam BP 124/70 mmHg  Pulse 69  Temp(Src) 98.1 F (36.7 C) (Oral)  Resp 20  Ht 6' (1.829 m)  Wt 307 lb (139.254 kg)  BMI 41.63 kg/m2  SpO2 96% VS noted,  Constitutional: Pt is oriented to person, place, and time. Appears well-developed and well-nourished, in no significant distress Head: Normocephalic and atraumatic.  Right Ear: External ear normal.  Left Ear: External ear normal.  Nose: Nose normal.  Mouth/Throat: Oropharynx is clear and moist.  Eyes: Conjunctivae and EOM are normal. Pupils are equal, round, and reactive to light.  Neck: Normal range of motion. Neck supple. No JVD present. No tracheal deviation present or significant neck LA or mass Cardiovascular: Normal rate, regular rhythm, normal heart sounds and intact distal pulses.   Pulmonary/Chest: Effort normal and breath sounds without rales or wheezing  Abdominal: Soft. Bowel sounds are normal. NT. No HSM  Musculoskeletal: Normal range of motion. Exhibits no edema.  Lymphadenopathy:  Has no cervical adenopathy.  Neurological: Pt is alert and oriented to person, place, and time. Pt has normal reflexes. No cranial nerve deficit. Motor grossly intact Skin: Skin is warm and dry. No rash noted.  Psychiatric:  Has normal mood and affect. Behavior  is normal.      Assessment & Plan:

## 2014-07-28 NOTE — Progress Notes (Signed)
Pre visit review using our clinic review tool, if applicable. No additional management support is needed unless otherwise documented below in the visit note. 

## 2014-07-28 NOTE — Patient Instructions (Addendum)
Please continue all other medications as before, and refills have been done if requested.  Please have the pharmacy call with any other refills you may need.  Please continue your efforts at being more active, low cholesterol diet, and weight control.  You are otherwise up to date with prevention measures today.  Please keep your appointments with your specialists as you may have planned  Please go to the LAB in the Basement (turn left off the elevator) for the tests to be done today - just the iron panel  You will be contacted by phone if any changes need to be made immediately.  Otherwise, you will receive a letter about your results with an explanation, but please check with MyChart first.  Please remember to sign up for MyChart if you have not done so, as this will be important to you in the future with finding out test results, communicating by private email, and scheduling acute appointments online when needed.  Please return in 6 months, or sooner if needed, with Lab testing done 3-5 days before  Your form is filled out today

## 2014-07-28 NOTE — Addendum Note (Signed)
Addended by: Corwin LevinsJOHN, Kaydyn Chism W on: 07/28/2014 12:33 PM   Modules accepted: Orders, SmartSet

## 2014-07-28 NOTE — Assessment & Plan Note (Signed)

## 2014-08-07 ENCOUNTER — Other Ambulatory Visit: Payer: Self-pay | Admitting: Internal Medicine

## 2014-08-15 ENCOUNTER — Other Ambulatory Visit: Payer: Self-pay | Admitting: Internal Medicine

## 2014-10-08 ENCOUNTER — Other Ambulatory Visit: Payer: Self-pay | Admitting: Internal Medicine

## 2014-10-10 MED ORDER — METFORMIN HCL 500 MG PO TABS: ORAL_TABLET | ORAL | Status: DC

## 2014-10-10 NOTE — Addendum Note (Signed)
Addended by: Aira Sallade M on: 10/10/2014 09:25 AM   Modules accepted: Orders  

## 2015-01-22 ENCOUNTER — Other Ambulatory Visit (INDEPENDENT_AMBULATORY_CARE_PROVIDER_SITE_OTHER): Payer: BLUE CROSS/BLUE SHIELD

## 2015-01-22 DIAGNOSIS — E119 Type 2 diabetes mellitus without complications: Secondary | ICD-10-CM

## 2015-01-22 LAB — HEPATIC FUNCTION PANEL
ALBUMIN: 4.3 g/dL (ref 3.5–5.2)
ALT: 25 U/L (ref 0–53)
AST: 18 U/L (ref 0–37)
Alkaline Phosphatase: 75 U/L (ref 39–117)
BILIRUBIN TOTAL: 0.5 mg/dL (ref 0.2–1.2)
Bilirubin, Direct: 0.1 mg/dL (ref 0.0–0.3)
TOTAL PROTEIN: 7.7 g/dL (ref 6.0–8.3)

## 2015-01-22 LAB — LIPID PANEL
Cholesterol: 162 mg/dL (ref 0–200)
HDL: 30.3 mg/dL — ABNORMAL LOW (ref >39.00)
NonHDL: 131.54
Total CHOL/HDL Ratio: 5
Triglycerides: 247 mg/dL — ABNORMAL HIGH (ref 0.0–149.0)
VLDL: 49.4 mg/dL — ABNORMAL HIGH (ref 0.0–40.0)

## 2015-01-22 LAB — BASIC METABOLIC PANEL
BUN: 31 mg/dL — ABNORMAL HIGH (ref 6–23)
CO2: 28 meq/L (ref 19–32)
Calcium: 9.8 mg/dL (ref 8.4–10.5)
Chloride: 104 mEq/L (ref 96–112)
Creatinine, Ser: 1.2 mg/dL (ref 0.40–1.50)
GFR: 66.19 mL/min (ref >60.00)
Glucose, Bld: 130 mg/dL — ABNORMAL HIGH (ref 70–99)
POTASSIUM: 4.7 meq/L (ref 3.5–5.1)
SODIUM: 140 meq/L (ref 135–145)

## 2015-01-22 LAB — HEMOGLOBIN A1C: Hgb A1c MFr Bld: 7 % — ABNORMAL HIGH (ref 4.6–6.5)

## 2015-01-22 LAB — LDL CHOLESTEROL, DIRECT: LDL DIRECT: 98 mg/dL

## 2015-01-30 ENCOUNTER — Encounter: Payer: Self-pay | Admitting: Internal Medicine

## 2015-01-30 ENCOUNTER — Ambulatory Visit (INDEPENDENT_AMBULATORY_CARE_PROVIDER_SITE_OTHER): Payer: BLUE CROSS/BLUE SHIELD | Admitting: Internal Medicine

## 2015-01-30 VITALS — BP 124/80 | HR 67 | Temp 97.7°F | Ht 72.0 in | Wt 310.0 lb

## 2015-01-30 DIAGNOSIS — Z0189 Encounter for other specified special examinations: Secondary | ICD-10-CM

## 2015-01-30 DIAGNOSIS — I10 Essential (primary) hypertension: Secondary | ICD-10-CM

## 2015-01-30 DIAGNOSIS — Z0000 Encounter for general adult medical examination without abnormal findings: Secondary | ICD-10-CM

## 2015-01-30 DIAGNOSIS — E119 Type 2 diabetes mellitus without complications: Secondary | ICD-10-CM

## 2015-01-30 DIAGNOSIS — E785 Hyperlipidemia, unspecified: Secondary | ICD-10-CM

## 2015-01-30 DIAGNOSIS — F411 Generalized anxiety disorder: Secondary | ICD-10-CM

## 2015-01-30 DIAGNOSIS — Z23 Encounter for immunization: Secondary | ICD-10-CM | POA: Diagnosis not present

## 2015-01-30 MED ORDER — ALPRAZOLAM 0.25 MG PO TABS: 0.2500 mg | ORAL_TABLET | Freq: Two times a day (BID) | ORAL | Status: DC | PRN

## 2015-01-30 MED ORDER — ATORVASTATIN CALCIUM 80 MG PO TABS: 80.0000 mg | ORAL_TABLET | Freq: Every day | ORAL | Status: DC

## 2015-01-30 NOTE — Progress Notes (Signed)
Pre visit review using our clinic review tool, if applicable. No additional management support is needed unless otherwise documented below in the visit note. 

## 2015-01-30 NOTE — Assessment & Plan Note (Signed)
stable overall by history and exam, recent data reviewed with pt, and pt to continue medical treatment as before,  to f/u any worsening symptoms or concerns Lab Results  Component Value Date   HGBA1C 7.0* 01/22/2015

## 2015-01-30 NOTE — Progress Notes (Signed)
Subjective:    Patient ID: Joseph Maxwell, male    DOB: October 29, 1955, 59 y.o.   MRN: [id number]  HPI  Here to f/u; overall doing ok,  Pt denies chest pain, increasing sob or doe, wheezing, orthopnea, PND, increased LE swelling, palpitations, dizziness or syncope.  Pt denies new neurological symptoms such as new headache, or facial or extremity weakness or numbness.  Pt denies polydipsia, polyuria, or low sugar episode.   Pt denies new neurological symptoms such as new headache, or facial or extremity weakness or numbness.   Pt states overall good compliance with meds, mostly trying to follow appropriate diet, with wt overall stable,  but little exercise however.  No new complaints.  Still not working yet after laid off at his age.  Has occas near panic episodes recently for no obvious reason, Denies worsening depressive symptoms, suicidal ideation,. Past Medical History  Diagnosis Date  . ALLERGIC RHINITIS   . BACK PAIN   . DIABETES MELLITUS   . DIVERTICULOSIS, COLON   . ERECTILE DYSFUNCTION   . FAMILIAL TREMOR   . GERD   . HYPERLIPIDEMIA   . HYPERTENSION   . OBSTRUCTIVE SLEEP APNEA   . ONYCHOMYCOSIS, TOENAILS   . PLANTAR FASCIITIS, LEFT   . Unspecified asthma(493.90)   . Cervical disc disease    Past Surgical History  Procedure Laterality Date  . Neck surgery  04/08/10    2 fusion and disctecomy/Dr. Wynetta Emery    reports that he has never smoked. He does not have any smokeless tobacco history on file. He reports that he drinks alcohol. He reports that he does not use illicit drugs. family history includes Diabetes in his father, mother, and sister; Hypertension in his other; Lung cancer in his other; Prostate cancer in his father; Sleep apnea in his mother. Allergies  Allergen Reactions  . Hydrocodone     REACTION: anxiety reaction  . Epinephrine Anxiety   Current Outpatient Prescriptions on File Prior to Visit  Medication Sig Dispense Refill  . aspirin 81 MG tablet Take 81 mg by mouth  daily.      Marland Kitchen atenolol (TENORMIN) 25 MG tablet Take 1 tablet (25 mg total) by mouth daily. 90 tablet 3  . atorvastatin (LIPITOR) 40 MG tablet TAKE ONE TABLET (40 MG TOTAL) BY MOUTH ONCE DAILY AT 6 PM 90 tablet 3  . glipiZIDE (GLUCOTROL XL) 5 MG 24 hr tablet TAKE ONE TABLET BY MOUTH ONCE DAILY WITH BREAKFAST 90 tablet 3  . glucose blood (FREESTYLE LITE) test strip Use as directed 1 per day 100 each 11  . Lancets (FREESTYLE) lancets Use as directed 1 per day 100 each 3  . Lancets (FREESTYLE) lancets Use as instructed once daily to check blood sugar.  Diagnosis code 250.00 100 each 11  . lisinopril-hydrochlorothiazide (PRINZIDE,ZESTORETIC) 20-12.5 MG per tablet Take 2 tablets by mouth daily. 180 tablet 3  . loratadine (CLARITIN) 10 MG tablet Take 10 mg by mouth as needed.      . metFORMIN (GLUCOPHAGE) 500 MG tablet TAKE 2 TABLETS BY MOUTH TWICE DAILY WITH FOOD. 120 tablet 5  . niacin 500 MG tablet Take 3 tablets (1,500 mg total) by mouth daily with breakfast. 270 tablet 3  . omeprazole (PRILOSEC) 20 MG capsule TAKE ONE CAPSULE BY MOUTH ONCE DAILY 90 capsule 3  . sitaGLIPtin (JANUVIA) 100 MG tablet Take 1 tablet (100 mg total) by mouth daily. 30 tablet 11  . VENTOLIN HFA 108 (90 BASE) MCG/ACT inhaler INHALE TWO PUFFS BY  MOUTH FOUR TIMES DAILY AS NEEDED  (Patient taking differently: INHALE TWO PUFFS BY MOUTH FOUR TIMES DAILY AS NEEDED) 18 g 3   No current facility-administered medications on file prior to visit.    Review of Systems  Constitutional: Negative for unusual diaphoresis or night sweats HENT: Negative for ringing in ear or discharge Eyes: Negative for double vision or worsening visual disturbance.  Respiratory: Negative for choking and stridor.   Gastrointestinal: Negative for vomiting or other signifcant bowel change Genitourinary: Negative for hematuria or change in urine volume.  Musculoskeletal: Negative for other MSK pain or swelling Skin: Negative for color change and worsening  wound.  Neurological: Negative for tremors and numbness other than noted  Psychiatric/Behavioral: Negative for decreased concentration or agitation other than above       Objective:   Physical Exam BP 124/80 mmHg  Pulse 67  Temp(Src) 97.7 F (36.5 C) (Oral)  Ht 6' (1.829 m)  Wt 310 lb (140.615 kg)  BMI 42.03 kg/m2  SpO2 98% VS noted,  Constitutional: Pt appears in no significant distress HENT: Head: NCAT.  Right Ear: External ear normal.  Left Ear: External ear normal.  Eyes: . Pupils are equal, round, and reactive to light. Conjunctivae and EOM are normal Neck: Normal range of motion. Neck supple.  Cardiovascular: Normal rate and regular rhythm.   Pulmonary/Chest: Effort normal and breath sounds without rales or wheezing.  Abd:  Soft, NT, ND, + BS Neurological: Pt is alert. Not confused , motor grossly intact Skin: Skin is warm. No rash, no LE edema Psychiatric: Pt behavior is normal. No agitation.      Assessment & Plan:

## 2015-01-30 NOTE — Assessment & Plan Note (Signed)
stable overall by history and exam, recent data reviewed with pt, and pt to continue medical treatment as before,  to f/u any worsening symptoms or concerns BP Readings from Last 3 Encounters:  01/30/15 124/80  07/28/14 124/70  01/31/14 132/82

## 2015-01-30 NOTE — Assessment & Plan Note (Signed)
Mild, occas panicky, for xanax .25 prn,  to f/u any worsening symptoms or concerns

## 2015-01-30 NOTE — Patient Instructions (Signed)
OK to increase the lipitor 80 mg per day  Please continue all other medications as before, and refills have been done if requested.  Please have the pharmacy call with any other refills you may need.  Please continue your efforts at being more active, low cholesterol diet, and weight control.  You are otherwise up to date with prevention measures today.  Please keep your appointments with your specialists as you may have planned  Please go to the LAB in the Basement (turn left off the elevator) for the tests to be done today  You will be contacted by phone if any changes need to be made immediately.  Otherwise, you will receive a letter about your results with an explanation, but please check with MyChart first.  Please remember to sign up for MyChart if you have not done so, as this will be important to you in the future with finding out test results, communicating by private email, and scheduling acute appointments online when needed.  Please return in 6 months, or sooner if needed, with Lab testing done 3-5 days before

## 2015-01-30 NOTE — Assessment & Plan Note (Signed)
Uncontrolled, for incresaed lipitor 80 mg, cont diet, f/u labs next visit

## 2015-02-06 ENCOUNTER — Other Ambulatory Visit: Payer: Self-pay | Admitting: Internal Medicine

## 2015-02-06 ENCOUNTER — Encounter: Payer: Self-pay | Admitting: Internal Medicine

## 2015-02-06 LAB — HM DIABETES EYE EXAM

## 2015-02-06 MED ORDER — LISINOPRIL-HYDROCHLOROTHIAZIDE 20-12.5 MG PO TABS: 2.0000 | ORAL_TABLET | Freq: Every day | ORAL | Status: DC

## 2015-02-06 MED ORDER — SITAGLIPTIN PHOSPHATE 100 MG PO TABS: 100.0000 mg | ORAL_TABLET | Freq: Every day | ORAL | Status: DC

## 2015-02-07 ENCOUNTER — Other Ambulatory Visit: Payer: Self-pay | Admitting: Internal Medicine

## 2015-03-09 ENCOUNTER — Other Ambulatory Visit: Payer: Self-pay

## 2015-03-09 MED ORDER — METFORMIN HCL 500 MG PO TABS: ORAL_TABLET | ORAL | Status: DC

## 2015-06-22 ENCOUNTER — Telehealth: Payer: Self-pay

## 2015-06-22 NOTE — Telephone Encounter (Signed)
PA Approved, pharmacy advised 

## 2015-06-22 NOTE — Telephone Encounter (Signed)
PA initiated via CoverMyMeds Key [id number]

## 2015-07-22 ENCOUNTER — Other Ambulatory Visit: Payer: Self-pay | Admitting: Internal Medicine

## 2015-07-23 ENCOUNTER — Encounter: Payer: Self-pay | Admitting: Internal Medicine

## 2015-07-25 ENCOUNTER — Telehealth: Payer: Self-pay

## 2015-07-25 ENCOUNTER — Other Ambulatory Visit (INDEPENDENT_AMBULATORY_CARE_PROVIDER_SITE_OTHER): Payer: BLUE CROSS/BLUE SHIELD

## 2015-07-25 DIAGNOSIS — R7989 Other specified abnormal findings of blood chemistry: Secondary | ICD-10-CM | POA: Diagnosis not present

## 2015-07-25 DIAGNOSIS — E119 Type 2 diabetes mellitus without complications: Secondary | ICD-10-CM

## 2015-07-25 DIAGNOSIS — Z0189 Encounter for other specified special examinations: Secondary | ICD-10-CM

## 2015-07-25 DIAGNOSIS — Z0000 Encounter for general adult medical examination without abnormal findings: Secondary | ICD-10-CM

## 2015-07-25 LAB — CBC WITH DIFFERENTIAL/PLATELET
BASOS ABS: 0.1 10*3/uL (ref 0.0–0.1)
Basophils Relative: 0.8 % (ref 0.0–3.0)
EOS ABS: 0.3 10*3/uL (ref 0.0–0.7)
Eosinophils Relative: 3.1 % (ref 0.0–5.0)
HEMATOCRIT: 37.9 % — AB (ref 39.0–52.0)
HEMOGLOBIN: 12.6 g/dL — AB (ref 13.0–17.0)
LYMPHS PCT: 21 % (ref 12.0–46.0)
Lymphs Abs: 2.1 10*3/uL (ref 0.7–4.0)
MCHC: 33.3 g/dL (ref 30.0–36.0)
MCV: 78.4 fl (ref 78.0–100.0)
MONOS PCT: 7.2 % (ref 3.0–12.0)
Monocytes Absolute: 0.7 10*3/uL (ref 0.1–1.0)
Neutro Abs: 6.9 10*3/uL (ref 1.4–7.7)
Neutrophils Relative %: 67.9 % (ref 43.0–77.0)
Platelets: 169 10*3/uL (ref 150.0–400.0)
RBC: 4.84 Mil/uL (ref 4.22–5.81)
RDW: 15 % (ref 11.5–15.5)
WBC: 10.2 10*3/uL (ref 4.0–10.5)

## 2015-07-25 LAB — HEPATIC FUNCTION PANEL
ALT: 27 U/L (ref 0–53)
AST: 23 U/L (ref 0–37)
Albumin: 4.2 g/dL (ref 3.5–5.2)
Alkaline Phosphatase: 94 U/L (ref 39–117)
Bilirubin, Direct: 0.1 mg/dL (ref 0.0–0.3)
TOTAL PROTEIN: 7.1 g/dL (ref 6.0–8.3)
Total Bilirubin: 0.6 mg/dL (ref 0.2–1.2)

## 2015-07-25 LAB — LIPID PANEL
CHOL/HDL RATIO: 6
CHOLESTEROL: 157 mg/dL (ref 0–200)
HDL: 28.4 mg/dL — AB (ref >39.00)
NonHDL: 128.13
TRIGLYCERIDES: 377 mg/dL — AB (ref 0.0–149.0)
VLDL: 75.4 mg/dL — ABNORMAL HIGH (ref 0.0–40.0)

## 2015-07-25 LAB — URINALYSIS, ROUTINE W REFLEX MICROSCOPIC
Hgb urine dipstick: NEGATIVE
KETONES UR: NEGATIVE
LEUKOCYTES UA: NEGATIVE
NITRITE: NEGATIVE
PH: 5 (ref 5.0–8.0)
URINE GLUCOSE: NEGATIVE
UROBILINOGEN UA: 1 (ref 0.0–1.0)

## 2015-07-25 LAB — LDL CHOLESTEROL, DIRECT: LDL DIRECT: 78 mg/dL

## 2015-07-25 LAB — BASIC METABOLIC PANEL
BUN: 25 mg/dL — AB (ref 6–23)
CALCIUM: 9.1 mg/dL (ref 8.4–10.5)
CHLORIDE: 99 meq/L (ref 96–112)
CO2: 28 meq/L (ref 19–32)
CREATININE: 1.18 mg/dL (ref 0.40–1.50)
GFR: 67.37 mL/min (ref >60.00)
Glucose, Bld: 164 mg/dL — ABNORMAL HIGH (ref 70–99)
Potassium: 4.5 mEq/L (ref 3.5–5.1)
Sodium: 137 mEq/L (ref 135–145)

## 2015-07-25 LAB — MICROALBUMIN / CREATININE URINE RATIO
CREATININE, U: 439.3 mg/dL
MICROALB/CREAT RATIO: 0.7 mg/g (ref 0.0–30.0)
Microalb, Ur: 2.9 mg/dL — ABNORMAL HIGH (ref 0.0–1.9)

## 2015-07-25 LAB — TSH: TSH: 3.67 u[IU]/mL (ref 0.35–4.50)

## 2015-07-25 LAB — PSA: PSA: 0.74 ng/mL (ref 0.10–4.00)

## 2015-07-25 LAB — HEMOGLOBIN A1C: Hgb A1c MFr Bld: 8.3 % — ABNORMAL HIGH (ref 4.6–6.5)

## 2015-07-25 NOTE — Telephone Encounter (Signed)
Orders done

## 2015-07-26 LAB — HEPATITIS C ANTIBODY: HCV AB: NEGATIVE

## 2015-07-31 ENCOUNTER — Ambulatory Visit (INDEPENDENT_AMBULATORY_CARE_PROVIDER_SITE_OTHER): Payer: BLUE CROSS/BLUE SHIELD | Admitting: Internal Medicine

## 2015-07-31 ENCOUNTER — Encounter: Payer: Self-pay | Admitting: Internal Medicine

## 2015-07-31 VITALS — BP 140/84 | HR 69 | Temp 98.5°F | Resp 20 | Wt 311.0 lb

## 2015-07-31 DIAGNOSIS — E785 Hyperlipidemia, unspecified: Secondary | ICD-10-CM | POA: Diagnosis not present

## 2015-07-31 DIAGNOSIS — J4520 Mild intermittent asthma, uncomplicated: Secondary | ICD-10-CM

## 2015-07-31 DIAGNOSIS — Z0000 Encounter for general adult medical examination without abnormal findings: Secondary | ICD-10-CM | POA: Diagnosis not present

## 2015-07-31 DIAGNOSIS — I10 Essential (primary) hypertension: Secondary | ICD-10-CM | POA: Diagnosis not present

## 2015-07-31 DIAGNOSIS — Z23 Encounter for immunization: Secondary | ICD-10-CM

## 2015-07-31 DIAGNOSIS — E119 Type 2 diabetes mellitus without complications: Secondary | ICD-10-CM

## 2015-07-31 MED ORDER — ALPRAZOLAM 0.25 MG PO TABS: 0.2500 mg | ORAL_TABLET | Freq: Two times a day (BID) | ORAL | Status: DC | PRN

## 2015-07-31 MED ORDER — GLIPIZIDE ER 10 MG PO TB24: 10.0000 mg | ORAL_TABLET | Freq: Every day | ORAL | Status: DC

## 2015-07-31 MED ORDER — LISINOPRIL-HYDROCHLOROTHIAZIDE 20-25 MG PO TABS
1.0000 | ORAL_TABLET | Freq: Every day | ORAL | Status: DC
Start: 2015-07-31 — End: 2015-08-01

## 2015-07-31 NOTE — Assessment & Plan Note (Signed)
Mild uncontrolled, o/w stable overall by history and exam, recent data reviewed with pt, and pt to continue medical treatment as before except to increase the glipizide eR to 10 mg daily,,  to f/u any worsening symptoms or concerns, plans to be more active at the gym, to call for any low sugars

## 2015-07-31 NOTE — Assessment & Plan Note (Signed)

## 2015-07-31 NOTE — Progress Notes (Signed)
Subjective:    Patient ID: Joseph Maxwell, male    DOB: [birth year], 58 y.o.   MRN: [id number]  HPI  Here for wellness and f/u;  Overall doing ok;  Pt denies Chest pain, worsening SOB, DOE, wheezing, orthopnea, PND, worsening LE edema, palpitations, dizziness or syncope.  Pt denies neurological change such as new headache, facial or extremity weakness.  Pt denies polydipsia, polyuria, or low sugar symptoms. Pt states overall good compliance with treatment and medications, good tolerability, and has been trying to follow appropriate diet.  Pt denies worsening depressive symptoms, suicidal ideation or panic. No fever, night sweats, wt loss, loss of appetite, or other constitutional symptoms.  Pt states good ability with ADL's, has low fall risk, home safety reviewed and adequate, no other significant changes in hearing or vision, and only occasionally active with exercise.  CBG's at home have more higher recently in the high 100's/maybe low 200's.   BP has been slightly overall recently as well.  Has arranged with his son to start at the gym.   Wt Readings from Last 3 Encounters:  07/31/15 311 lb (141.069 kg)  01/30/15 310 lb (140.615 kg)  07/28/14 307 lb (139.254 kg)   BP Readings from Last 3 Encounters:  07/31/15 140/84  01/30/15 124/80  07/28/14 124/70   Past Medical History  Diagnosis Date  . ALLERGIC RHINITIS   . BACK PAIN   . DIABETES MELLITUS   . DIVERTICULOSIS, COLON   . ERECTILE DYSFUNCTION   . FAMILIAL TREMOR   . GERD   . HYPERLIPIDEMIA   . HYPERTENSION   . OBSTRUCTIVE SLEEP APNEA   . ONYCHOMYCOSIS, TOENAILS   . PLANTAR FASCIITIS, LEFT   . Unspecified asthma(493.90)   . Cervical disc disease    Past Surgical History  Procedure Laterality Date  . Neck surgery  04/08/10    2 fusion and disctecomy/Dr. Wynetta Emeryram    reports that he has never smoked. He does not have any smokeless tobacco history on file. He reports that he drinks alcohol. He reports that he does not use illicit  drugs. family history includes Diabetes in his father, mother, and sister; Hypertension in his other; Lung cancer in his other; Prostate cancer in his father; Sleep apnea in his mother. Allergies  Allergen Reactions  . Hydrocodone     REACTION: anxiety reaction  . Epinephrine Anxiety   Current Outpatient Prescriptions on File Prior to Visit  Medication Sig Dispense Refill  . ALPRAZolam (XANAX) 0.25 MG tablet Take 1 tablet (0.25 mg total) by mouth 2 (two) times daily as needed for anxiety. 40 tablet 1  . aspirin 81 MG tablet Take 81 mg by mouth daily.      Marland Kitchen. atenolol (TENORMIN) 25 MG tablet Take 1 tablet (25 mg total) by mouth daily. 90 tablet 3  . atorvastatin (LIPITOR) 80 MG tablet Take 1 tablet (80 mg total) by mouth daily. 90 tablet 3  . FREESTYLE LITE test strip USE AS DIRECTED ONCE DAILY. 100 each 3  . glipiZIDE (GLUCOTROL XL) 5 MG 24 hr tablet TAKE ONE TABLET BY MOUTH ONCE DAILY WITH BREAKFAST 90 tablet 3  . Lancets (FREESTYLE) lancets Use as directed 1 per day 100 each 3  . Lancets (FREESTYLE) lancets Use as instructed once daily to check blood sugar.  Diagnosis code 250.00 100 each 11  . lisinopril-hydrochlorothiazide (PRINZIDE,ZESTORETIC) 20-12.5 MG tablet Take 2 tablets by mouth daily. 180 tablet 3  . loratadine (CLARITIN) 10 MG tablet Take 10 mg by mouth  as needed.      . metFORMIN (GLUCOPHAGE) 500 MG tablet TAKE 2 TABLETS BY MOUTH TWICE DAILY WITH FOOD. 120 tablet 5  . niacin 500 MG tablet Take 3 tablets (1,500 mg total) by mouth daily with breakfast. 270 tablet 3  . omeprazole (PRILOSEC) 20 MG capsule TAKE ONE CAPSULE BY MOUTH ONCE DAILY 90 capsule 3  . sitaGLIPtin (JANUVIA) 100 MG tablet Take 1 tablet (100 mg total) by mouth daily. 30 tablet 11  . VENTOLIN HFA 108 (90 BASE) MCG/ACT inhaler INHALE TWO PUFFS BY MOUTH FOUR TIMES DAILY AS NEEDED  (Patient taking differently: INHALE TWO PUFFS BY MOUTH FOUR TIMES DAILY AS NEEDED) 18 g 3   No current facility-administered medications  on file prior to visit.   Review of Systems Constitutional: Negative for increased diaphoresis, other activity, appetite or siginficant weight change other than noted HENT: Negative for worsening hearing loss, ear pain, facial swelling, mouth sores and neck stiffness.   Eyes: Negative for other worsening pain, redness or visual disturbance.  Respiratory: Negative for shortness of breath and wheezing  Cardiovascular: Negative for chest pain and palpitations.  Gastrointestinal: Negative for diarrhea, blood in stool, abdominal distention or other pain Genitourinary: Negative for hematuria, flank pain or change in urine volume.  Musculoskeletal: Negative for myalgias or other joint complaints.  Skin: Negative for color change and wound or drainage.  Neurological: Negative for syncope and numbness. other than noted Hematological: Negative for adenopathy. or other swelling Psychiatric/Behavioral: Negative for hallucinations, SI, self-injury, decreased concentration or other worsening agitation.      Objective:   Physical Exam BP 140/84 mmHg  Pulse 69  Temp(Src) 98.5 F (36.9 C) (Oral)  Resp 20  Wt 311 lb (141.069 kg)  SpO2 95% VS noted,  Constitutional: Pt is oriented to person, place, and time. Appears well-developed and well-nourished, in no significant distress Head: Normocephalic and atraumatic.  Right Ear: External ear normal.  Left Ear: External ear normal.  Nose: Nose normal.  Mouth/Throat: Oropharynx is clear and moist.  Eyes: Conjunctivae and EOM are normal. Pupils are equal, round, and reactive to light.  Neck: Normal range of motion. Neck supple. No JVD present. No tracheal deviation present or significant neck LA or mass Cardiovascular: Normal rate, regular rhythm, normal heart sounds and intact distal pulses.   Pulmonary/Chest: Effort normal and breath sounds without rales or wheezing  Abdominal: Soft. Bowel sounds are normal. NT. No HSM  Musculoskeletal: Normal range of  motion. Exhibits no edema.  Lymphadenopathy:  Has no cervical adenopathy.  Neurological: Pt is alert and oriented to person, place, and time. Pt has normal reflexes. No cranial nerve deficit. Motor grossly intact Skin: Skin is warm and dry. No rash noted.  Psychiatric:  Has normal mood and affect. Behavior is normal.     Assessment & Plan:

## 2015-07-31 NOTE — Assessment & Plan Note (Signed)
stable overall by history and exam, recent data reviewed with pt, and pt to continue medical treatment as before,  to f/u any worsening symptoms or concerns SpO2 Readings from Last 3 Encounters:  07/31/15 95%  01/30/15 98%  07/28/14 96%

## 2015-07-31 NOTE — Assessment & Plan Note (Signed)
Borderline elev - for increase lis HCt to 20/25 mg per day, to f/u any worsening symptoms or concerns

## 2015-07-31 NOTE — Assessment & Plan Note (Signed)
stable overall by history and exam, recent data reviewed with pt, and pt to continue medical treatment as before,  to f/u any worsening symptoms or concerns Lab Results  Component Value Date   LDLCALC 67 07/19/2013

## 2015-07-31 NOTE — Patient Instructions (Addendum)
You had the new Prevnar pneumonia shot today  Your EKG was OK today  OK to change the lisinopril 20-12.5 mg to the 20-25 mg per day  OK to increase the glipizide ER 5 mg to the 10 mg - 1 per day  Please continue all other medications as before, and refills have been done if requested - the xanax  Please have the pharmacy call with any other refills you may need.  Please continue your efforts at being more active, low cholesterol diet, and weight control.  You are otherwise up to date with prevention measures today.  Please keep your appointments with your specialists as you may have planned  Your Form for Scouting was signed today  Please return in 6 months, or sooner if needed, with Lab testing done 3-5 days before

## 2015-07-31 NOTE — Progress Notes (Signed)
Pre visit review using our clinic review tool, if applicable. No additional management support is needed unless otherwise documented below in the visit note. 

## 2015-08-01 ENCOUNTER — Encounter: Payer: Self-pay | Admitting: Internal Medicine

## 2015-08-01 MED ORDER — AMLODIPINE BESYLATE 2.5 MG PO TABS: 2.5000 mg | ORAL_TABLET | Freq: Every day | ORAL | Status: DC

## 2015-08-01 MED ORDER — LISINOPRIL-HYDROCHLOROTHIAZIDE 20-12.5 MG PO TABS: 2.0000 | ORAL_TABLET | Freq: Every day | ORAL | Status: DC

## 2015-08-06 ENCOUNTER — Other Ambulatory Visit: Payer: Self-pay

## 2015-08-06 ENCOUNTER — Encounter: Payer: Self-pay | Admitting: Internal Medicine

## 2015-08-06 MED ORDER — FREESTYLE LANCETS MISC: Status: DC

## 2015-08-25 ENCOUNTER — Other Ambulatory Visit: Payer: Self-pay | Admitting: Internal Medicine

## 2015-09-18 ENCOUNTER — Other Ambulatory Visit: Payer: Self-pay | Admitting: Internal Medicine

## 2015-10-31 ENCOUNTER — Other Ambulatory Visit: Payer: Self-pay

## 2015-10-31 MED ORDER — METFORMIN HCL 500 MG PO TABS: ORAL_TABLET | ORAL | Status: DC

## 2015-12-11 ENCOUNTER — Other Ambulatory Visit: Payer: Self-pay | Admitting: Internal Medicine

## 2016-01-25 ENCOUNTER — Other Ambulatory Visit (INDEPENDENT_AMBULATORY_CARE_PROVIDER_SITE_OTHER): Payer: BLUE CROSS/BLUE SHIELD

## 2016-01-25 DIAGNOSIS — E119 Type 2 diabetes mellitus without complications: Secondary | ICD-10-CM

## 2016-01-25 LAB — HEPATIC FUNCTION PANEL
ALBUMIN: 4 g/dL (ref 3.5–5.2)
ALT: 24 U/L (ref 0–53)
AST: 22 U/L (ref 0–37)
Alkaline Phosphatase: 85 U/L (ref 39–117)
BILIRUBIN TOTAL: 0.6 mg/dL (ref 0.2–1.2)
Bilirubin, Direct: 0.3 mg/dL (ref 0.0–0.3)
Total Protein: 7.4 g/dL (ref 6.0–8.3)

## 2016-01-25 LAB — LDL CHOLESTEROL, DIRECT: LDL DIRECT: 83 mg/dL

## 2016-01-25 LAB — BASIC METABOLIC PANEL
BUN: 24 mg/dL — AB (ref 6–23)
CALCIUM: 8.7 mg/dL (ref 8.4–10.5)
CO2: 30 mEq/L (ref 19–32)
Chloride: 100 mEq/L (ref 96–112)
Creatinine, Ser: 1.28 mg/dL (ref 0.40–1.50)
GFR: 61.23 mL/min (ref >60.00)
Glucose, Bld: 108 mg/dL — ABNORMAL HIGH (ref 70–99)
POTASSIUM: 4.4 meq/L (ref 3.5–5.1)
SODIUM: 139 meq/L (ref 135–145)

## 2016-01-25 LAB — LIPID PANEL
CHOL/HDL RATIO: 5
CHOLESTEROL: 148 mg/dL (ref 0–200)
HDL: 26.9 mg/dL — AB (ref >39.00)
NonHDL: 120.87
Triglycerides: 251 mg/dL — ABNORMAL HIGH (ref 0.0–149.0)
VLDL: 50.2 mg/dL — AB (ref 0.0–40.0)

## 2016-01-25 LAB — HEMOGLOBIN A1C: Hgb A1c MFr Bld: 7.3 % — ABNORMAL HIGH (ref 4.6–6.5)

## 2016-01-31 ENCOUNTER — Encounter: Payer: Self-pay | Admitting: Internal Medicine

## 2016-01-31 ENCOUNTER — Ambulatory Visit (INDEPENDENT_AMBULATORY_CARE_PROVIDER_SITE_OTHER): Payer: BLUE CROSS/BLUE SHIELD | Admitting: Internal Medicine

## 2016-01-31 VITALS — BP 136/74 | HR 67 | Temp 98.8°F | Resp 20 | Wt 313.0 lb

## 2016-01-31 DIAGNOSIS — E119 Type 2 diabetes mellitus without complications: Secondary | ICD-10-CM | POA: Diagnosis not present

## 2016-01-31 DIAGNOSIS — I10 Essential (primary) hypertension: Secondary | ICD-10-CM | POA: Diagnosis not present

## 2016-01-31 DIAGNOSIS — Z23 Encounter for immunization: Secondary | ICD-10-CM | POA: Diagnosis not present

## 2016-01-31 DIAGNOSIS — R6889 Other general symptoms and signs: Secondary | ICD-10-CM

## 2016-01-31 DIAGNOSIS — E785 Hyperlipidemia, unspecified: Secondary | ICD-10-CM | POA: Diagnosis not present

## 2016-01-31 DIAGNOSIS — F411 Generalized anxiety disorder: Secondary | ICD-10-CM

## 2016-01-31 DIAGNOSIS — Z0001 Encounter for general adult medical examination with abnormal findings: Secondary | ICD-10-CM

## 2016-01-31 MED ORDER — ALPRAZOLAM 0.25 MG PO TABS: 0.2500 mg | ORAL_TABLET | Freq: Two times a day (BID) | ORAL | 2 refills | Status: DC | PRN

## 2016-01-31 NOTE — Progress Notes (Signed)
Pre visit review using our clinic review tool, if applicable. No additional management support is needed unless otherwise documented below in the visit note. 

## 2016-01-31 NOTE — Patient Instructions (Signed)
Please continue all other medications as before, and refills have been done if requested - the xanax  Please have the pharmacy call with any other refills you may need.  Please continue your efforts at being more active, low cholesterol diet, and weight control..  Please keep your appointments with your specialists as you may have planned  Please return in 6 months, or sooner if needed, with Lab testing done 3-5 days before

## 2016-01-31 NOTE — Progress Notes (Signed)
Subjective:    Patient ID: Joseph Maxwell, male    DOB: 1955/12/05, 60 y.o.   MRN: [id number]  HPI  Here to f/u; overall doing ok,  Pt denies chest pain, increasing sob or doe, wheezing, orthopnea, PND, increased LE swelling, palpitations, dizziness or syncope.  Pt denies new neurological symptoms such as new headache, or facial or extremity weakness or numbness.  Pt denies polydipsia, polyuria, or low sugar episode.   Pt denies new neurological symptoms such as new headache, or facial or extremity weakness or numbness.   Pt states overall good compliance with meds, mostly trying to follow appropriate diet, with wt overall stable,  but little exercise however.  Has not been to the gym in past 6 mo, has 3 lb increase but still working well on diet.   Wt Readings from Last 3 Encounters:  01/31/16 (!) 313 lb (142 kg)  07/31/15 (!) 311 lb (141.1 kg)  01/30/15 (!) 310 lb (140.6 kg)   Past Medical History:  Diagnosis Date  . ALLERGIC RHINITIS   . BACK PAIN   . Cervical disc disease   . DIABETES MELLITUS   . DIVERTICULOSIS, COLON   . ERECTILE DYSFUNCTION   . FAMILIAL TREMOR   . GERD   . HYPERLIPIDEMIA   . HYPERTENSION   . OBSTRUCTIVE SLEEP APNEA   . ONYCHOMYCOSIS, TOENAILS   . PLANTAR FASCIITIS, LEFT   . Unspecified asthma(493.90)    Past Surgical History:  Procedure Laterality Date  . NECK SURGERY  04/08/10   2 fusion and disctecomy/Dr. Wynetta Emery    reports that he has never smoked. He does not have any smokeless tobacco history on file. He reports that he drinks alcohol. He reports that he does not use drugs. family history includes Diabetes in his father, mother, and sister; Hypertension in his other; Lung cancer in his other; Prostate cancer in his father; Sleep apnea in his mother. Allergies  Allergen Reactions  . Hydrocodone     REACTION: anxiety reaction  . Epinephrine Anxiety   Current Outpatient Prescriptions on File Prior to Visit  Medication Sig Dispense Refill  . amLODipine  (NORVASC) 2.5 MG tablet Take 1 tablet (2.5 mg total) by mouth daily. 90 tablet 3  . aspirin 81 MG tablet Take 81 mg by mouth daily.      Marland Kitchen atenolol (TENORMIN) 25 MG tablet TAKE ONE TABLET BY MOUTH ONE TIME DAILY 90 tablet 3  . atorvastatin (LIPITOR) 80 MG tablet Take 1 tablet (80 mg total) by mouth daily. 90 tablet 3  . FREESTYLE LITE test strip USE AS DIRECTED ONCE DAILY. 100 each 3  . glipiZIDE (GLIPIZIDE XL) 10 MG 24 hr tablet Take 1 tablet (10 mg total) by mouth daily with breakfast. 90 tablet 3  . glipiZIDE (GLUCOTROL XL) 5 MG 24 hr tablet TAKE 1 TAB ONCE A DAY WITH BREAKFAST 90 tablet 0  . Lancets (FREESTYLE) lancets Use as directed 1 per day 100 each 3  . Lancets (FREESTYLE) lancets Use as instructed once daily to check blood sugar.  Diagnosis code 250.00 100 each 11  . lisinopril-hydrochlorothiazide (ZESTORETIC) 20-12.5 MG tablet Take 2 tablets by mouth daily. 180 tablet 3  . loratadine (CLARITIN) 10 MG tablet Take 10 mg by mouth as needed.      . metFORMIN (GLUCOPHAGE) 500 MG tablet TAKE 2 TABLETS BY MOUTH TWICE DAILY WITH FOOD. 120 tablet 2  . niacin 500 MG tablet Take 3 tablets (1,500 mg total) by mouth daily with breakfast. 270  tablet 3  . omeprazole (PRILOSEC) 20 MG capsule TAKE ONE CAPSULE BY MOUTH ONCE DAILY 90 capsule 0  . sitaGLIPtin (JANUVIA) 100 MG tablet Take 1 tablet (100 mg total) by mouth daily. 30 tablet 11  . VENTOLIN HFA 108 (90 BASE) MCG/ACT inhaler INHALE TWO PUFFS BY MOUTH FOUR TIMES DAILY AS NEEDED  (Patient taking differently: INHALE TWO PUFFS BY MOUTH FOUR TIMES DAILY AS NEEDED) 18 g 3   No current facility-administered medications on file prior to visit.    Review of Systems  Constitutional: Negative for unusual diaphoresis or night sweats HENT: Negative for ear swelling or discharge Eyes: Negative for worsening visual haziness  Respiratory: Negative for choking and stridor.   Gastrointestinal: Negative for distension or worsening eructation Genitourinary:  Negative for retention or change in urine volume.  Musculoskeletal: Negative for other MSK pain or swelling Skin: Negative for color change and worsening wound Neurological: Negative for tremors and numbness other than noted  Psychiatric/Behavioral: Negative for decreased concentration or agitation other than above       Objective:   Physical Exam BP 136/74   Pulse 67   Temp 98.8 F (37.1 C) (Oral)   Resp 20   Wt (!) 313 lb (142 kg)   SpO2 95%   BMI 42.45 kg/m  VS noted,  Constitutional: Pt appears in no apparent distress HENT: Head: NCAT.  Right Ear: External ear normal.  Left Ear: External ear normal.  Eyes: . Pupils are equal, round, and reactive to light. Conjunctivae and EOM are normal Neck: Normal range of motion. Neck supple.  Cardiovascular: Normal rate and regular rhythm.   Pulmonary/Chest: Effort normal and breath sounds without rales or wheezing.  Abd:  Soft, NT, ND, + BS Neurological: Pt is alert. Not confused , motor grossly intact Skin: Skin is warm. No rash, no LE edema Psychiatric: Pt behavior is normal. No agitation.     Assessment & Plan:

## 2016-02-02 NOTE — Assessment & Plan Note (Signed)
stable overall by history and exam, recent data reviewed with pt, and pt to continue medical treatment as before,  to f/u any worsening symptoms or concerns Lab Results  Component Value Date   WBC 10.2 07/25/2015   HGB 12.6 (L) 07/25/2015   HCT 37.9 (L) 07/25/2015   PLT 169.0 07/25/2015   GLUCOSE 108 (H) 01/25/2016   CHOL 148 01/25/2016   TRIG 251.0 (H) 01/25/2016   HDL 26.90 (L) 01/25/2016   LDLDIRECT 83.0 01/25/2016   LDLCALC 67 07/19/2013   ALT 24 01/25/2016   AST 22 01/25/2016   NA 139 01/25/2016   K 4.4 01/25/2016   CL 100 01/25/2016   CREATININE 1.28 01/25/2016   BUN 24 (H) 01/25/2016   CO2 30 01/25/2016   TSH 3.67 07/25/2015   PSA 0.74 07/25/2015   HGBA1C 7.3 (H) 01/25/2016   MICROALBUR 2.9 (H) 07/25/2015

## 2016-02-02 NOTE — Assessment & Plan Note (Signed)
stable overall by history and exam, recent data reviewed with pt, and pt to continue medical treatment as before,  to f/u any worsening symptoms or concerns Lab Results  Component Value Date   LDLCALC 67 07/19/2013

## 2016-02-02 NOTE — Assessment & Plan Note (Signed)
stable overall by history and exam, recent data reviewed with pt, and pt to continue medical treatment as before,  to f/u any worsening symptoms or concerns BP Readings from Last 3 Encounters:  01/31/16 136/74  07/31/15 140/84  01/30/15 124/80

## 2016-02-02 NOTE — Assessment & Plan Note (Signed)
stable overall by history and exam, recent data reviewed with pt, and pt to continue medical treatment as before,  to f/u any worsening symptoms or concerns Lab Results  Component Value Date   HGBA1C 7.3 (H) 01/25/2016    

## 2016-02-17 ENCOUNTER — Other Ambulatory Visit: Payer: Self-pay | Admitting: Internal Medicine

## 2016-02-18 ENCOUNTER — Other Ambulatory Visit: Payer: Self-pay | Admitting: Internal Medicine

## 2016-03-27 ENCOUNTER — Other Ambulatory Visit: Payer: Self-pay | Admitting: Internal Medicine

## 2016-06-10 ENCOUNTER — Other Ambulatory Visit: Payer: Self-pay | Admitting: Internal Medicine

## 2016-06-19 ENCOUNTER — Other Ambulatory Visit: Payer: Self-pay | Admitting: Internal Medicine

## 2016-07-08 ENCOUNTER — Other Ambulatory Visit: Payer: Self-pay | Admitting: Internal Medicine

## 2016-07-19 ENCOUNTER — Other Ambulatory Visit: Payer: Self-pay | Admitting: Internal Medicine

## 2016-07-19 MED ORDER — OMEPRAZOLE 20 MG PO CPDR: 20.0000 mg | DELAYED_RELEASE_CAPSULE | Freq: Every day | ORAL | 1 refills | Status: DC

## 2016-07-25 ENCOUNTER — Other Ambulatory Visit (INDEPENDENT_AMBULATORY_CARE_PROVIDER_SITE_OTHER): Payer: BLUE CROSS/BLUE SHIELD

## 2016-07-25 DIAGNOSIS — R7989 Other specified abnormal findings of blood chemistry: Secondary | ICD-10-CM

## 2016-07-25 DIAGNOSIS — E119 Type 2 diabetes mellitus without complications: Secondary | ICD-10-CM | POA: Diagnosis not present

## 2016-07-25 DIAGNOSIS — Z0001 Encounter for general adult medical examination with abnormal findings: Secondary | ICD-10-CM

## 2016-07-25 LAB — URINALYSIS, ROUTINE W REFLEX MICROSCOPIC
Bilirubin Urine: NEGATIVE
Hgb urine dipstick: NEGATIVE
KETONES UR: NEGATIVE
LEUKOCYTES UA: NEGATIVE
Nitrite: NEGATIVE
PH: 5.5 (ref 5.0–8.0)
RBC / HPF: NONE SEEN (ref 0–?)
Total Protein, Urine: NEGATIVE
URINE GLUCOSE: NEGATIVE
UROBILINOGEN UA: 1 (ref 0.0–1.0)

## 2016-07-25 LAB — CBC WITH DIFFERENTIAL/PLATELET
BASOS PCT: 1.2 % (ref 0.0–3.0)
Basophils Absolute: 0.1 10*3/uL (ref 0.0–0.1)
EOS PCT: 3.3 % (ref 0.0–5.0)
Eosinophils Absolute: 0.3 10*3/uL (ref 0.0–0.7)
HEMATOCRIT: 38 % — AB (ref 39.0–52.0)
HEMOGLOBIN: 12.6 g/dL — AB (ref 13.0–17.0)
Lymphocytes Relative: 23.7 % (ref 12.0–46.0)
Lymphs Abs: 2.1 10*3/uL (ref 0.7–4.0)
MCHC: 33.1 g/dL (ref 30.0–36.0)
MCV: 77.8 fl — ABNORMAL LOW (ref 78.0–100.0)
MONO ABS: 0.7 10*3/uL (ref 0.1–1.0)
Monocytes Relative: 8.5 % (ref 3.0–12.0)
Neutro Abs: 5.5 10*3/uL (ref 1.4–7.7)
Neutrophils Relative %: 63.3 % (ref 43.0–77.0)
Platelets: 168 10*3/uL (ref 150.0–400.0)
RBC: 4.88 Mil/uL (ref 4.22–5.81)
RDW: 14.8 % (ref 11.5–15.5)
WBC: 8.8 10*3/uL (ref 4.0–10.5)

## 2016-07-25 LAB — HEPATIC FUNCTION PANEL
ALK PHOS: 94 U/L (ref 39–117)
ALT: 34 U/L (ref 0–53)
AST: 30 U/L (ref 0–37)
Albumin: 4.3 g/dL (ref 3.5–5.2)
Bilirubin, Direct: 0.1 mg/dL (ref 0.0–0.3)
Total Bilirubin: 0.6 mg/dL (ref 0.2–1.2)
Total Protein: 7.2 g/dL (ref 6.0–8.3)

## 2016-07-25 LAB — BASIC METABOLIC PANEL
BUN: 28 mg/dL — AB (ref 6–23)
CO2: 28 mEq/L (ref 19–32)
Calcium: 9.4 mg/dL (ref 8.4–10.5)
Chloride: 99 mEq/L (ref 96–112)
Creatinine, Ser: 1.36 mg/dL (ref 0.40–1.50)
GFR: 56.99 mL/min — AB (ref >60.00)
Glucose, Bld: 155 mg/dL — ABNORMAL HIGH (ref 70–99)
POTASSIUM: 4.3 meq/L (ref 3.5–5.1)
Sodium: 135 mEq/L (ref 135–145)

## 2016-07-25 LAB — MICROALBUMIN / CREATININE URINE RATIO
Creatinine,U: 427.8 mg/dL
MICROALB UR: 2.2 mg/dL — AB (ref 0.0–1.9)
Microalb Creat Ratio: 0.5 mg/g (ref 0.0–30.0)

## 2016-07-25 LAB — LDL CHOLESTEROL, DIRECT: LDL DIRECT: 88 mg/dL

## 2016-07-25 LAB — LIPID PANEL
CHOL/HDL RATIO: 6
Cholesterol: 160 mg/dL (ref 0–200)
HDL: 27.6 mg/dL — ABNORMAL LOW (ref >39.00)
NonHDL: 132.48
Triglycerides: 244 mg/dL — ABNORMAL HIGH (ref 0.0–149.0)
VLDL: 48.8 mg/dL — AB (ref 0.0–40.0)

## 2016-07-25 LAB — PSA: PSA: 0.79 ng/mL (ref 0.10–4.00)

## 2016-07-25 LAB — HEMOGLOBIN A1C: Hgb A1c MFr Bld: 8.2 % — ABNORMAL HIGH (ref 4.6–6.5)

## 2016-07-25 LAB — TSH: TSH: 3.05 u[IU]/mL (ref 0.35–4.50)

## 2016-07-31 ENCOUNTER — Encounter: Payer: Self-pay | Admitting: Internal Medicine

## 2016-07-31 ENCOUNTER — Ambulatory Visit (INDEPENDENT_AMBULATORY_CARE_PROVIDER_SITE_OTHER): Payer: BLUE CROSS/BLUE SHIELD | Admitting: Internal Medicine

## 2016-07-31 VITALS — BP 122/70 | HR 70 | Temp 97.8°F | Ht 72.0 in | Wt 313.0 lb

## 2016-07-31 DIAGNOSIS — Z23 Encounter for immunization: Secondary | ICD-10-CM | POA: Diagnosis not present

## 2016-07-31 DIAGNOSIS — E119 Type 2 diabetes mellitus without complications: Secondary | ICD-10-CM | POA: Diagnosis not present

## 2016-07-31 DIAGNOSIS — D509 Iron deficiency anemia, unspecified: Secondary | ICD-10-CM | POA: Diagnosis not present

## 2016-07-31 DIAGNOSIS — Z0000 Encounter for general adult medical examination without abnormal findings: Secondary | ICD-10-CM | POA: Diagnosis not present

## 2016-07-31 MED ORDER — AMLODIPINE BESYLATE 2.5 MG PO TABS: 2.5000 mg | ORAL_TABLET | Freq: Every day | ORAL | 3 refills | Status: DC

## 2016-07-31 MED ORDER — DULAGLUTIDE 0.75 MG/0.5ML ~~LOC~~ SOAJ: SUBCUTANEOUS | 3 refills | Status: DC

## 2016-07-31 NOTE — Patient Instructions (Addendum)
You had the Pneumovax pneumonia shot today  Please take all new medication as prescribed - the trulicity (the starting dose)  Please call in 1 month if you feel the sugars are still too high, for an increase of the trulicity  Please continue all other medications as before, and refills have been done if requested.  Please have the pharmacy call with any other refills you may need.  Please continue your efforts at being more active, low cholesterol diet, and weight control.  You are otherwise up to date with prevention measures today.  Please keep your appointments with your specialists as you may have planned  Your form was filled out today  Please return in 6 months, or sooner if needed, with Lab testing done 3-5 days before

## 2016-07-31 NOTE — Progress Notes (Signed)
Subjective:    Patient ID: Joseph EagleCharles Piltz, male    DOB: 03/11/1956, 61 y.o.   MRN: [id number]  HPI  Here for wellness and f/u;  Overall doing ok;  Pt denies Chest pain, worsening SOB, DOE, wheezing, orthopnea, PND, worsening LE edema, palpitations, dizziness or syncope.  Pt denies neurological change such as new headache, facial or extremity weakness.  Pt denies polydipsia, polyuria, or low sugar symptoms. Pt states overall good compliance with treatment and medications, good tolerability, and has been trying to follow appropriate diet.  Pt denies worsening depressive symptoms, suicidal ideation or panic. No fever, night sweats, wt loss, loss of appetite, or other constitutional symptoms.  Pt states good ability with ADL's, has low fall risk, home safety reviewed and adequate, no other significant changes in hearing or vision, and only occasionally active with exercise.  Hard to lose wt.  Needs [employer] form filled out today No other new hx Past Medical History:  Diagnosis Date  . ALLERGIC RHINITIS   . BACK PAIN   . Cervical disc disease   . DIABETES MELLITUS   . DIVERTICULOSIS, COLON   . ERECTILE DYSFUNCTION   . FAMILIAL TREMOR   . GERD   . HYPERLIPIDEMIA   . HYPERTENSION   . OBSTRUCTIVE SLEEP APNEA   . ONYCHOMYCOSIS, TOENAILS   . PLANTAR FASCIITIS, LEFT   . Unspecified asthma(493.90)    Past Surgical History:  Procedure Laterality Date  . NECK SURGERY  04/08/10   2 fusion and disctecomy/Dr. Wynetta Emeryram    reports that he has never smoked. He has never used smokeless tobacco. He reports that he drinks alcohol. He reports that he does not use drugs. family history includes Diabetes in his father, mother, and sister; Hypertension in his other; Lung cancer in his other; Prostate cancer in his father; Sleep apnea in his mother. Allergies  Allergen Reactions  . Hydrocodone     REACTION: anxiety reaction  . Epinephrine Anxiety   Current Outpatient Prescriptions on File Prior to Visit    Medication Sig Dispense Refill  . ALPRAZolam (XANAX) 0.25 MG tablet Take 1 tablet (0.25 mg total) by mouth 2 (two) times daily as needed for anxiety. 60 tablet 2  . aspirin 81 MG tablet Take 81 mg by mouth daily.      Marland Kitchen. atenolol (TENORMIN) 25 MG tablet TAKE ONE TABLET BY MOUTH ONE TIME DAILY 90 tablet 3  . atorvastatin (LIPITOR) 80 MG tablet TAKE 1 TABLET (80 MG TOTAL) BY MOUTH DAILY. 90 tablet 3  . FREESTYLE LITE test strip USE AS DIRECTED ONCE DAILY. 100 each 3  . glipiZIDE (GLIPIZIDE XL) 10 MG 24 hr tablet Take 1 tablet (10 mg total) by mouth daily with breakfast. 90 tablet 3  . JANUVIA 100 MG tablet TAKE 1 TABLET (100 MG TOTAL) BY MOUTH DAILY. 30 tablet 11  . Lancets (FREESTYLE) lancets Use as directed 1 per day 100 each 3  . lisinopril-hydrochlorothiazide (ZESTORETIC) 20-12.5 MG tablet Take 2 tablets by mouth daily. 180 tablet 3  . loratadine (CLARITIN) 10 MG tablet Take 10 mg by mouth as needed.      . metFORMIN (GLUCOPHAGE) 500 MG tablet TAKE 2 TABLETS BY MOUTH TWICE DAILY WITH FOOD. 120 tablet 2  . niacin 500 MG tablet Take 3 tablets (1,500 mg total) by mouth daily with breakfast. 270 tablet 3  . omeprazole (PRILOSEC) 20 MG capsule Take 1 capsule (20 mg total) by mouth daily. Yearly physical is due in March must see MD  for refills 30 capsule 0  . VENTOLIN HFA 108 (90 Base) MCG/ACT inhaler INHALE TWO PUFFS BY MOUTH FOUR TIMES DAILY AS NEEDED 18 Inhaler 1   No current facility-administered medications on file prior to visit.   Has unfortunately not been able to keep up at the gym regularly since dec b/c has had several what sound like cough and URI.  Wt has remained stable but simply much less active.  Wt Readings from Last 3 Encounters:  07/31/16 (!) 313 lb (142 kg)  01/31/16 (!) 313 lb (142 kg)  07/31/15 (!) 311 lb (141.1 kg)   Past Medical History:  Diagnosis Date  . ALLERGIC RHINITIS   . BACK PAIN   . Cervical disc disease   . DIABETES MELLITUS   . DIVERTICULOSIS, COLON   .  ERECTILE DYSFUNCTION   . FAMILIAL TREMOR   . GERD   . HYPERLIPIDEMIA   . HYPERTENSION   . OBSTRUCTIVE SLEEP APNEA   . ONYCHOMYCOSIS, TOENAILS   . PLANTAR FASCIITIS, LEFT   . Unspecified asthma(493.90)    Past Surgical History:  Procedure Laterality Date  . NECK SURGERY  04/08/10   2 fusion and disctecomy/Dr. Wynetta Emery    reports that he has never smoked. He has never used smokeless tobacco. He reports that he drinks alcohol. He reports that he does not use drugs. family history includes Diabetes in his father, mother, and sister; Hypertension in his other; Lung cancer in his other; Prostate cancer in his father; Sleep apnea in his mother. Allergies  Allergen Reactions  . Hydrocodone     REACTION: anxiety reaction  . Epinephrine Anxiety   Current Outpatient Prescriptions on File Prior to Visit  Medication Sig Dispense Refill  . ALPRAZolam (XANAX) 0.25 MG tablet Take 1 tablet (0.25 mg total) by mouth 2 (two) times daily as needed for anxiety. 60 tablet 2  . aspirin 81 MG tablet Take 81 mg by mouth daily.      Marland Kitchen atenolol (TENORMIN) 25 MG tablet TAKE ONE TABLET BY MOUTH ONE TIME DAILY 90 tablet 3  . atorvastatin (LIPITOR) 80 MG tablet TAKE 1 TABLET (80 MG TOTAL) BY MOUTH DAILY. 90 tablet 3  . FREESTYLE LITE test strip USE AS DIRECTED ONCE DAILY. 100 each 3  . glipiZIDE (GLIPIZIDE XL) 10 MG 24 hr tablet Take 1 tablet (10 mg total) by mouth daily with breakfast. 90 tablet 3  . JANUVIA 100 MG tablet TAKE 1 TABLET (100 MG TOTAL) BY MOUTH DAILY. 30 tablet 11  . Lancets (FREESTYLE) lancets Use as directed 1 per day 100 each 3  . lisinopril-hydrochlorothiazide (ZESTORETIC) 20-12.5 MG tablet Take 2 tablets by mouth daily. 180 tablet 3  . loratadine (CLARITIN) 10 MG tablet Take 10 mg by mouth as needed.      . metFORMIN (GLUCOPHAGE) 500 MG tablet TAKE 2 TABLETS BY MOUTH TWICE DAILY WITH FOOD. 120 tablet 2  . niacin 500 MG tablet Take 3 tablets (1,500 mg total) by mouth daily with breakfast. 270  tablet 3  . omeprazole (PRILOSEC) 20 MG capsule Take 1 capsule (20 mg total) by mouth daily. Yearly physical is due in March must see MD for refills 30 capsule 0  . VENTOLIN HFA 108 (90 Base) MCG/ACT inhaler INHALE TWO PUFFS BY MOUTH FOUR TIMES DAILY AS NEEDED 18 Inhaler 1   No current facility-administered medications on file prior to visit.    Review of Systems Constitutional: Negative for increased diaphoresis, or other activity, appetite or siginficant weight change other  than noted HENT: Negative for worsening hearing loss, ear pain, facial swelling, mouth sores and neck stiffness.   Eyes: Negative for other worsening pain, redness or visual disturbance.  Respiratory: Negative for choking or stridor Cardiovascular: Negative for other chest pain and palpitations.  Gastrointestinal: Negative for worsening diarrhea, blood in stool, or abdominal distention Genitourinary: Negative for hematuria, flank pain or change in urine volume.  Musculoskeletal: Negative for myalgias or other joint complaints.  Skin: Negative for other color change and wound or drainage.  Neurological: Negative for syncope and numbness. other than noted Hematological: Negative for adenopathy. or other swelling Psychiatric/Behavioral: Negative for hallucinations, SI, self-injury, decreased concentration or other worsening agitation.  All other system neg per pt    Objective:   Physical Exam BP 122/70   Pulse 70   Temp 97.8 F (36.6 C) (Oral)   Ht 6' (1.829 m)   Wt (!) 313 lb (142 kg)   SpO2 98%   BMI 42.45 kg/m  VS noted, morbid obese Constitutional: Pt is oriented to person, place, and time. Appears well-developed and well-nourished, in no significant distress Head: Normocephalic and atraumatic  Eyes: Conjunctivae and EOM are normal. Pupils are equal, round, and reactive to light Right Ear: External ear normal.  Left Ear: External ear normal Nose: Nose normal.  Mouth/Throat: Oropharynx is clear and moist    Neck: Normal range of motion. Neck supple. No JVD present. No tracheal deviation present or significant neck LA or mass Cardiovascular: Normal rate, regular rhythm, normal heart sounds and intact distal pulses.   Pulmonary/Chest: Effort normal and breath sounds without rales or wheezing  Abdominal: Soft. Bowel sounds are normal. NT. No HSM  Musculoskeletal: Normal range of motion. Exhibits no edema Lymphadenopathy: Has no cervical adenopathy.  Neurological: Pt is alert and oriented to person, place, and time. Pt has normal reflexes. No cranial nerve deficit. Motor grossly intact Skin: Skin is warm and dry. No rash noted or new ulcers Psychiatric:  Has normal mood and affect. Behavior is normal.  No other exam findings ECG today I have personally interpreted Sinus  Rhythm   WNL     Assessment & Plan:

## 2016-07-31 NOTE — Progress Notes (Signed)
Pre visit review using our clinic review tool, if applicable. No additional management support is needed unless otherwise documented below in the visit note. 

## 2016-08-02 NOTE — Assessment & Plan Note (Signed)

## 2016-08-02 NOTE — Assessment & Plan Note (Signed)
Also for iron level but not sure if he will do today

## 2016-08-02 NOTE — Assessment & Plan Note (Signed)
stable overall by history and exam, recent data reviewed with pt, and pt to continue medical treatment as before,  to f/u any worsening symptoms or concerns Lab Results  Component Value Date   HGBA1C 8.2 (H) 07/25/2016    

## 2016-08-07 ENCOUNTER — Encounter: Payer: Self-pay | Admitting: Internal Medicine

## 2016-08-07 MED ORDER — AMLODIPINE BESYLATE 2.5 MG PO TABS: 2.5000 mg | ORAL_TABLET | Freq: Every day | ORAL | 3 refills | Status: DC

## 2016-08-14 ENCOUNTER — Other Ambulatory Visit: Payer: Self-pay | Admitting: Internal Medicine

## 2016-08-19 ENCOUNTER — Encounter: Payer: Self-pay | Admitting: Internal Medicine

## 2016-08-28 ENCOUNTER — Other Ambulatory Visit: Payer: Self-pay | Admitting: Internal Medicine

## 2016-09-08 ENCOUNTER — Other Ambulatory Visit: Payer: Self-pay | Admitting: Internal Medicine

## 2016-09-14 ENCOUNTER — Other Ambulatory Visit: Payer: Self-pay | Admitting: Internal Medicine

## 2016-12-06 ENCOUNTER — Other Ambulatory Visit: Payer: Self-pay | Admitting: Internal Medicine

## 2016-12-08 ENCOUNTER — Other Ambulatory Visit: Payer: Self-pay | Admitting: Internal Medicine

## 2016-12-08 NOTE — Telephone Encounter (Signed)
[facility] last [facility]/11/2016 [facility] will forward to MD for approval when he return tomorrow...Raechel Chute/lmb

## 2016-12-09 MED ORDER — ALPRAZOLAM 0.25 MG PO TABS: 0.2500 mg | ORAL_TABLET | Freq: Two times a day (BID) | ORAL | 2 refills | Status: DC | PRN

## 2016-12-09 NOTE — Telephone Encounter (Signed)
Faxed

## 2016-12-09 NOTE — Telephone Encounter (Signed)
Done hardcopy to Shirron  

## 2017-01-07 ENCOUNTER — Other Ambulatory Visit: Payer: Self-pay | Admitting: Internal Medicine

## 2017-01-20 ENCOUNTER — Other Ambulatory Visit (INDEPENDENT_AMBULATORY_CARE_PROVIDER_SITE_OTHER): Payer: BLUE CROSS/BLUE SHIELD

## 2017-01-20 ENCOUNTER — Other Ambulatory Visit: Payer: Self-pay | Admitting: Internal Medicine

## 2017-01-20 DIAGNOSIS — D509 Iron deficiency anemia, unspecified: Secondary | ICD-10-CM

## 2017-01-20 DIAGNOSIS — Z0000 Encounter for general adult medical examination without abnormal findings: Secondary | ICD-10-CM

## 2017-01-20 DIAGNOSIS — E119 Type 2 diabetes mellitus without complications: Secondary | ICD-10-CM

## 2017-01-20 LAB — LIPID PANEL
CHOL/HDL RATIO: 5
Cholesterol: 134 mg/dL (ref 0–200)
HDL: 26.4 mg/dL — AB (ref >39.00)
LDL CALC: 71 mg/dL (ref 0–99)
NONHDL: 107.94
Triglycerides: 186 mg/dL — ABNORMAL HIGH (ref 0.0–149.0)
VLDL: 37.2 mg/dL (ref 0.0–40.0)

## 2017-01-20 LAB — HIV ANTIBODY (ROUTINE TESTING W REFLEX): HIV: NONREACTIVE

## 2017-01-20 LAB — IBC PANEL
Iron: 38 ug/dL — ABNORMAL LOW (ref 42–165)
SATURATION RATIOS: 13.6 % — AB (ref 20.0–50.0)
Transferrin: 200 mg/dL — ABNORMAL LOW (ref 212.0–360.0)

## 2017-01-20 LAB — HEPATIC FUNCTION PANEL
ALT: 15 U/L (ref 0–53)
AST: 14 U/L (ref 0–37)
Albumin: 4.2 g/dL (ref 3.5–5.2)
Alkaline Phosphatase: 84 U/L (ref 39–117)
BILIRUBIN DIRECT: 0.1 mg/dL (ref 0.0–0.3)
BILIRUBIN TOTAL: 0.6 mg/dL (ref 0.2–1.2)
Total Protein: 7.6 g/dL (ref 6.0–8.3)

## 2017-01-20 LAB — BASIC METABOLIC PANEL
BUN: 23 mg/dL (ref 6–23)
CALCIUM: 9.5 mg/dL (ref 8.4–10.5)
CO2: 28 mEq/L (ref 19–32)
CREATININE: 1.15 mg/dL (ref 0.40–1.50)
Chloride: 101 mEq/L (ref 96–112)
GFR: 69.05 mL/min (ref >60.00)
Glucose, Bld: 129 mg/dL — ABNORMAL HIGH (ref 70–99)
Potassium: 4.5 mEq/L (ref 3.5–5.1)
Sodium: 140 mEq/L (ref 135–145)

## 2017-01-20 LAB — HEMOGLOBIN A1C: Hgb A1c MFr Bld: 5.9 % (ref 4.6–6.5)

## 2017-01-21 ENCOUNTER — Other Ambulatory Visit: Payer: Self-pay | Admitting: Internal Medicine

## 2017-01-30 ENCOUNTER — Ambulatory Visit (INDEPENDENT_AMBULATORY_CARE_PROVIDER_SITE_OTHER): Payer: BLUE CROSS/BLUE SHIELD | Admitting: Internal Medicine

## 2017-01-30 ENCOUNTER — Encounter: Payer: Self-pay | Admitting: Internal Medicine

## 2017-01-30 VITALS — BP 126/82 | HR 78 | Temp 98.1°F | Ht 72.0 in | Wt 283.0 lb

## 2017-01-30 DIAGNOSIS — Z0000 Encounter for general adult medical examination without abnormal findings: Secondary | ICD-10-CM | POA: Diagnosis not present

## 2017-01-30 DIAGNOSIS — E119 Type 2 diabetes mellitus without complications: Secondary | ICD-10-CM

## 2017-01-30 DIAGNOSIS — E785 Hyperlipidemia, unspecified: Secondary | ICD-10-CM | POA: Diagnosis not present

## 2017-01-30 DIAGNOSIS — Z23 Encounter for immunization: Secondary | ICD-10-CM | POA: Diagnosis not present

## 2017-01-30 DIAGNOSIS — I10 Essential (primary) hypertension: Secondary | ICD-10-CM

## 2017-01-30 MED ORDER — ALPRAZOLAM 0.5 MG PO TABS: 0.5000 mg | ORAL_TABLET | Freq: Two times a day (BID) | ORAL | 5 refills | Status: DC | PRN

## 2017-01-30 NOTE — Assessment & Plan Note (Signed)
stable overall by history and exam, recent data reviewed with pt, and pt to continue medical treatment as before,  to f/u any worsening symptoms or concerns BP Readings from Last 3 Encounters:  01/30/17 126/82  07/31/16 122/70  01/31/16 136/74

## 2017-01-30 NOTE — Assessment & Plan Note (Signed)
Now slightly overcontrolled, ok to hold the glipizide on days he is not eating to avoid low sugars especially with driving or othe activiy; o/w stable overall by history and exam, recent data reviewed with pt, and pt to continue medical treatment as before,  to f/u any worsening symptoms or concerns Lab Results  Component Value Date   HGBA1C 5.9 01/20/2017

## 2017-01-30 NOTE — Progress Notes (Signed)
Subjective:    Patient ID: Joseph Maxwell, male    DOB: Feb 23, 1956, 61 y.o.   MRN: [id number]  HPI   Here to f/u; overall doing ok,  Pt denies chest pain, increasing sob or doe, wheezing, orthopnea, PND, increased LE swelling, palpitations, dizziness or syncope.  Pt denies new neurological symptoms such as new headache, or facial or extremity weakness or numbness.  Pt denies polydipsia, polyuria,  Pt states overall good compliance with meds, mostly trying to follow appropriate diet,but little exercise however.  Lost wt with fasting every other day and better diet but no change in activity level.  Has 2 episodes of low sugar at 61 and 81, was at home, able to eat and resolve.  Began after a friend at church had his doctor give him a copy of "the obesity code."  CBG's have overall improved  Always takes meds daily.   Wt Readings from Last 3 Encounters:  01/30/17 283 lb (128.4 kg)  07/31/16 (!) 313 lb (142 kg)  01/31/16 (!) 313 lb (142 kg)   Past Medical History:  Diagnosis Date  . ALLERGIC RHINITIS   . BACK PAIN   . Cervical disc disease   . DIABETES MELLITUS   . DIVERTICULOSIS, COLON   . ERECTILE DYSFUNCTION   . FAMILIAL TREMOR   . GERD   . HYPERLIPIDEMIA   . HYPERTENSION   . OBSTRUCTIVE SLEEP APNEA   . ONYCHOMYCOSIS, TOENAILS   . PLANTAR FASCIITIS, LEFT   . Unspecified asthma(493.90)    Past Surgical History:  Procedure Laterality Date  . NECK SURGERY  04/08/10   2 fusion and disctecomy/Dr. Wynetta Emery    reports that he has never smoked. He has never used smokeless tobacco. He reports that he drinks alcohol. He reports that he does not use drugs. family history includes Diabetes in his father, mother, and sister; Hypertension in his other; Lung cancer in his other; Prostate cancer in his father; Sleep apnea in his mother. Allergies  Allergen Reactions  . Hydrocodone     REACTION: anxiety reaction  . Epinephrine Anxiety   Current Outpatient Prescriptions on File Prior to Visit    Medication Sig Dispense Refill  . amLODipine (NORVASC) 2.5 MG tablet Take 1 tablet (2.5 mg total) by mouth daily. 90 tablet 3  . aspirin 81 MG tablet Take 81 mg by mouth daily.      Marland Kitchen atenolol (TENORMIN) 25 MG tablet TAKE ONE TABLET BY MOUTH ONE TIME DAILY 90 tablet 2  . atorvastatin (LIPITOR) 80 MG tablet TAKE 1 TABLET (80 MG TOTAL) BY MOUTH DAILY. 90 tablet 3  . glipiZIDE (GLUCOTROL XL) 10 MG 24 hr tablet TAKE 1 TABLET (10 MG TOTAL) BY MOUTH DAILY WITH BREAKFAST. 90 tablet 2  . glucose blood (FREESTYLE LITE) test strip Use to check blood sugar twice a day 100 each 5  . JANUVIA 100 MG tablet TAKE 1 TABLET (100 MG TOTAL) BY MOUTH DAILY. 30 tablet 5  . Lancets (FREESTYLE) lancets Use as directed 1 per day 100 each 3  . lisinopril-hydrochlorothiazide (PRINZIDE,ZESTORETIC) 20-12.5 MG tablet Take 2 tablets by mouth daily. 180 tablet 2  . loratadine (CLARITIN) 10 MG tablet Take 10 mg by mouth as needed.      . metFORMIN (GLUCOPHAGE) 500 MG tablet Take 2 tablets (1,000 mg total) by mouth 2 (two) times daily with a meal. FOLLOW-UP APPT DUE IN SEPT 120 tablet 2  . niacin 500 MG tablet Take 3 tablets (1,500 mg total) by mouth daily  with breakfast. 270 tablet 3  . omeprazole (PRILOSEC) 20 MG capsule Take 1 capsule (20 mg total) by mouth daily. Yearly physical is due in March must see MD for refills 30 capsule 0  . omeprazole (PRILOSEC) 20 MG capsule TAKE 1 CAPSULE BY MOUTH ONCE DAILY 90 capsule 1  . VENTOLIN HFA 108 (90 Base) MCG/ACT inhaler INHALE TWO PUFFS BY MOUTH FOUR TIMES DAILY AS NEEDED 18 Inhaler 1   No current facility-administered medications on file prior to visit.    Review of Systems  Constitutional: Negative for other unusual diaphoresis or sweats HENT: Negative for ear discharge or swelling Eyes: Negative for other worsening visual disturbances Respiratory: Negative for stridor or other swelling  Gastrointestinal: Negative for worsening distension or other blood Genitourinary:  Negative for retention or other urinary change Musculoskeletal: Negative for other MSK pain or swelling Skin: Negative for color change or other new lesions Neurological: Negative for worsening tremors and other numbness  Psychiatric/Behavioral: Negative for worsening agitation or other fatigue All other system neg per pt    Objective:   Physical Exam BP 126/82   Pulse 78   Temp 98.1 F (36.7 C) (Oral)   Ht 6' (1.829 m)   Wt 283 lb (128.4 kg)   SpO2 99%   BMI 38.38 kg/m   VS noted,  Constitutional: Pt appears in NAD HENT: Head: NCAT.  Right Ear: External ear normal.  Left Ear: External ear normal.  Eyes: . Pupils are equal, round, and reactive to light. Conjunctivae and EOM are normal Nose: without d/c or deformity Neck: Neck supple. Gross normal ROM Cardiovascular: Normal rate and regular rhythm.   Pulmonary/Chest: Effort normal and breath sounds without rales or wheezing.  Abd:  Soft, NT, ND, + BS, no organomegaly Neurological: Pt is alert. At baseline orientation, motor grossly intact Skin: Skin is warm. No rashes, other new lesions, no LE edema Psychiatric: Pt behavior is normal without agitation , mild nervous No other exam findings    Assessment & Plan:

## 2017-01-30 NOTE — Patient Instructions (Addendum)
You had the flu shot today  OK to hold on taking the glipizide on the days of least calories (fasting days)  Please continue all other medications as before, and refills have been done if requested.  Please have the pharmacy call with any other refills you may need.  Please continue your efforts at being more active, low cholesterol diet, and weight control.  Please keep your appointments with your specialists as you may have planned  Please return in 6 months, or sooner if needed, with Lab testing done 3-5 days before

## 2017-01-30 NOTE — Assessment & Plan Note (Signed)
stable overall by history and exam, recent data reviewed with pt, and pt to continue medical treatment as before,  to f/u any worsening symptoms or concerns Lab Results  Component Value Date   LDLCALC 71 01/20/2017

## 2017-03-06 ENCOUNTER — Telehealth: Payer: Self-pay | Admitting: Internal Medicine

## 2017-03-06 MED ORDER — METFORMIN HCL 500 MG PO TABS: 1000.0000 mg | ORAL_TABLET | Freq: Two times a day (BID) | ORAL | 3 refills | Status: DC

## 2017-03-06 NOTE — Telephone Encounter (Signed)
Pharmacy called in for a refill of  his  metFORMIN (GLUCOPHAGE) 500 MG tablet Please advise  [facility]

## 2017-03-06 NOTE — Telephone Encounter (Signed)
Reviewed chart pt is up-to-date sent refills to [facility].../lmb  

## 2017-03-31 ENCOUNTER — Other Ambulatory Visit: Payer: Self-pay

## 2017-03-31 MED ORDER — ATORVASTATIN CALCIUM 80 MG PO TABS: 80.0000 mg | ORAL_TABLET | Freq: Every day | ORAL | 3 refills | Status: DC

## 2017-03-31 MED ORDER — GLIPIZIDE ER 10 MG PO TB24: 10.0000 mg | ORAL_TABLET | Freq: Every day | ORAL | 3 refills | Status: DC

## 2017-04-06 ENCOUNTER — Other Ambulatory Visit: Payer: Self-pay

## 2017-04-06 MED ORDER — SITAGLIPTIN PHOSPHATE 100 MG PO TABS: 100.0000 mg | ORAL_TABLET | Freq: Every day | ORAL | 3 refills | Status: DC

## 2017-05-06 ENCOUNTER — Other Ambulatory Visit: Payer: Self-pay | Admitting: Internal Medicine

## 2017-05-09 ENCOUNTER — Other Ambulatory Visit: Payer: Self-pay | Admitting: Internal Medicine

## 2017-07-22 ENCOUNTER — Other Ambulatory Visit (INDEPENDENT_AMBULATORY_CARE_PROVIDER_SITE_OTHER): Payer: Managed Care, Other (non HMO)

## 2017-07-22 DIAGNOSIS — E119 Type 2 diabetes mellitus without complications: Secondary | ICD-10-CM

## 2017-07-22 DIAGNOSIS — Z0000 Encounter for general adult medical examination without abnormal findings: Secondary | ICD-10-CM

## 2017-07-22 LAB — HEPATIC FUNCTION PANEL
ALK PHOS: 90 U/L (ref 39–117)
ALT: 16 U/L (ref 0–53)
AST: 12 U/L (ref 0–37)
Albumin: 4.2 g/dL (ref 3.5–5.2)
BILIRUBIN DIRECT: 0.1 mg/dL (ref 0.0–0.3)
TOTAL PROTEIN: 7 g/dL (ref 6.0–8.3)
Total Bilirubin: 0.5 mg/dL (ref 0.2–1.2)

## 2017-07-22 LAB — CBC WITH DIFFERENTIAL/PLATELET
BASOS ABS: 0.1 10*3/uL (ref 0.0–0.1)
Basophils Relative: 1 % (ref 0.0–3.0)
EOS ABS: 0.4 10*3/uL (ref 0.0–0.7)
Eosinophils Relative: 4 % (ref 0.0–5.0)
HEMATOCRIT: 37.5 % — AB (ref 39.0–52.0)
HEMOGLOBIN: 12.6 g/dL — AB (ref 13.0–17.0)
LYMPHS PCT: 29.9 % (ref 12.0–46.0)
Lymphs Abs: 3.2 10*3/uL (ref 0.7–4.0)
MCHC: 33.5 g/dL (ref 30.0–36.0)
MCV: 79.2 fl (ref 78.0–100.0)
MONOS PCT: 7.6 % (ref 3.0–12.0)
Monocytes Absolute: 0.8 10*3/uL (ref 0.1–1.0)
Neutro Abs: 6.1 10*3/uL (ref 1.4–7.7)
Neutrophils Relative %: 57.5 % (ref 43.0–77.0)
PLATELETS: 181 10*3/uL (ref 150.0–400.0)
RBC: 4.73 Mil/uL (ref 4.22–5.81)
RDW: 14.6 % (ref 11.5–15.5)
WBC: 10.6 10*3/uL — AB (ref 4.0–10.5)

## 2017-07-22 LAB — MICROALBUMIN / CREATININE URINE RATIO
CREATININE, U: 242.8 mg/dL
Microalb Creat Ratio: 0.4 mg/g (ref 0.0–30.0)
Microalb, Ur: 1 mg/dL (ref 0.0–1.9)

## 2017-07-22 LAB — URINALYSIS, ROUTINE W REFLEX MICROSCOPIC
Bilirubin Urine: NEGATIVE
Hgb urine dipstick: NEGATIVE
KETONES UR: NEGATIVE
Leukocytes, UA: NEGATIVE
Nitrite: NEGATIVE
PH: 5.5 (ref 5.0–8.0)
RBC / HPF: NONE SEEN (ref 0–?)
Total Protein, Urine: NEGATIVE
URINE GLUCOSE: NEGATIVE
UROBILINOGEN UA: 0.2 (ref 0.0–1.0)

## 2017-07-22 LAB — LDL CHOLESTEROL, DIRECT: LDL DIRECT: 93 mg/dL

## 2017-07-22 LAB — BASIC METABOLIC PANEL
BUN: 23 mg/dL (ref 6–23)
CALCIUM: 9.5 mg/dL (ref 8.4–10.5)
CHLORIDE: 103 meq/L (ref 96–112)
CO2: 30 mEq/L (ref 19–32)
CREATININE: 1.13 mg/dL (ref 0.40–1.50)
GFR: 70.34 mL/min (ref >60.00)
Glucose, Bld: 105 mg/dL — ABNORMAL HIGH (ref 70–99)
Potassium: 4.5 mEq/L (ref 3.5–5.1)
Sodium: 141 mEq/L (ref 135–145)

## 2017-07-22 LAB — LIPID PANEL
CHOL/HDL RATIO: 5
CHOLESTEROL: 150 mg/dL (ref 0–200)
HDL: 30.7 mg/dL — ABNORMAL LOW (ref >39.00)
NonHDL: 119.1
Triglycerides: 232 mg/dL — ABNORMAL HIGH (ref 0.0–149.0)
VLDL: 46.4 mg/dL — ABNORMAL HIGH (ref 0.0–40.0)

## 2017-07-22 LAB — TSH: TSH: 3.03 u[IU]/mL (ref 0.35–4.50)

## 2017-07-22 LAB — PSA: PSA: 1.03 ng/mL (ref 0.10–4.00)

## 2017-07-22 LAB — HEMOGLOBIN A1C: Hgb A1c MFr Bld: 6.3 % (ref 4.6–6.5)

## 2017-07-30 ENCOUNTER — Encounter: Payer: Self-pay | Admitting: Internal Medicine

## 2017-07-30 ENCOUNTER — Ambulatory Visit (INDEPENDENT_AMBULATORY_CARE_PROVIDER_SITE_OTHER): Payer: Managed Care, Other (non HMO) | Admitting: Internal Medicine

## 2017-07-30 VITALS — BP 124/82 | HR 74 | Temp 97.8°F | Ht 72.0 in | Wt 291.0 lb

## 2017-07-30 DIAGNOSIS — E785 Hyperlipidemia, unspecified: Secondary | ICD-10-CM

## 2017-07-30 DIAGNOSIS — E119 Type 2 diabetes mellitus without complications: Secondary | ICD-10-CM | POA: Diagnosis not present

## 2017-07-30 DIAGNOSIS — Z0000 Encounter for general adult medical examination without abnormal findings: Secondary | ICD-10-CM | POA: Diagnosis not present

## 2017-07-30 DIAGNOSIS — I10 Essential (primary) hypertension: Secondary | ICD-10-CM | POA: Diagnosis not present

## 2017-07-30 NOTE — Patient Instructions (Signed)

## 2017-07-30 NOTE — Progress Notes (Signed)
Subjective:    Patient ID: Joseph Maxwell, male    DOB: 1956-03-06, 62 y.o.   MRN: [id number]  HPI  Here for wellness and f/u;  Overall doing ok;  Pt denies Chest pain, worsening SOB, DOE, wheezing, orthopnea, PND, worsening LE edema, palpitations, dizziness or syncope.  Pt denies neurological change such as new headache, facial or extremity weakness.  Pt denies polydipsia, polyuria, or low sugar symptoms. Pt states overall good compliance with treatment and medications, good tolerability, and has been trying to follow appropriate diet.  Pt denies worsening depressive symptoms, suicidal ideation or panic. No fever, night sweats, wt loss, loss of appetite, or other constitutional symptoms.  Pt states good ability with ADL's, has low fall risk, home safety reviewed and adequate, no other significant changes in hearing or vision, and only occasionally active with exercise.   Wt Readings from Last 3 Encounters:  07/30/17 291 lb (132 kg)  01/30/17 283 lb (128.4 kg)  07/31/16 (!) 313 lb (142 kg)  Has overall lost wt from 1 yr, tends to fluctuate with diet.  Maybe sleeping and somewhat better energy with wt loss.  Mother died April 05, 2017 with CHF.   Past Medical History:  Diagnosis Date  . ALLERGIC RHINITIS   . BACK PAIN   . Cervical disc disease   . DIABETES MELLITUS   . DIVERTICULOSIS, COLON   . ERECTILE DYSFUNCTION   . FAMILIAL TREMOR   . GERD   . HYPERLIPIDEMIA   . HYPERTENSION   . OBSTRUCTIVE SLEEP APNEA   . ONYCHOMYCOSIS, TOENAILS   . PLANTAR FASCIITIS, LEFT   . Unspecified asthma(493.90)    Past Surgical History:  Procedure Laterality Date  . NECK SURGERY  04/08/10   2 fusion and disctecomy/Dr. Wynetta Emery    reports that he has never smoked. He has never used smokeless tobacco. He reports that he drinks alcohol. He reports that he does not use drugs. family history includes Diabetes in his father, mother, and sister; Heart failure in his mother; Hypertension in his other; Lung cancer in  his other; Prostate cancer in his father; Sleep apnea in his mother; Stroke in his mother. Allergies  Allergen Reactions  . Hydrocodone     REACTION: anxiety reaction  . Epinephrine Anxiety   Current Outpatient Medications on File Prior to Visit  Medication Sig Dispense Refill  . ALPRAZolam (XANAX) 0.5 MG tablet Take 1 tablet (0.5 mg total) by mouth 2 (two) times daily as needed for anxiety. 60 tablet 5  . amLODipine (NORVASC) 2.5 MG tablet Take 1 tablet (2.5 mg total) by mouth daily. 90 tablet 3  . aspirin 81 MG tablet Take 81 mg by mouth daily.      Marland Kitchen atenolol (TENORMIN) 25 MG tablet TAKE ONE TABLET BY MOUTH ONE TIME DAILY 90 tablet 2  . atorvastatin (LIPITOR) 80 MG tablet Take 1 tablet (80 mg total) daily by mouth. 90 tablet 3  . glipiZIDE (GLUCOTROL XL) 10 MG 24 hr tablet Take 1 tablet (10 mg total) daily with breakfast by mouth. 90 tablet 3  . glucose blood (FREESTYLE LITE) test strip Use to check blood sugar twice a day 100 each 5  . Lancets (FREESTYLE) lancets Use as directed 1 per day 100 each 3  . lisinopril-hydrochlorothiazide (PRINZIDE,ZESTORETIC) 20-12.5 MG tablet TAKE 2 TABLETS BY MOUTH DAILY. 180 tablet 0  . loratadine (CLARITIN) 10 MG tablet Take 10 mg by mouth as needed.      . metFORMIN (GLUCOPHAGE) 500 MG tablet  Take 2 tablets (1,000 mg total) by mouth 2 (two) times daily with a meal. 360 tablet 3  . niacin 500 MG tablet Take 3 tablets (1,500 mg total) by mouth daily with breakfast. 270 tablet 3  . omeprazole (PRILOSEC) 20 MG capsule TAKE 1 CAPSULE BY MOUTH ONCE DAILY 90 capsule 1  . sitaGLIPtin (JANUVIA) 100 MG tablet Take 1 tablet (100 mg total) by mouth daily. 90 tablet 3  . VENTOLIN HFA 108 (90 Base) MCG/ACT inhaler INHALE TWO PUFFS BY MOUTH FOUR TIMES DAILY AS NEEDED 18 Inhaler 1   No current facility-administered medications on file prior to visit.    Review of Systems Constitutional: Negative for other unusual diaphoresis, sweats, appetite or weight changes HENT:  Negative for other worsening hearing loss, ear pain, facial swelling, mouth sores or neck stiffness.   Eyes: Negative for other worsening pain, redness or other visual disturbance.  Respiratory: Negative for other stridor or swelling Cardiovascular: Negative for other palpitations or other chest pain  Gastrointestinal: Negative for worsening diarrhea or loose stools, blood in stool, distention or other pain Genitourinary: Negative for hematuria, flank pain or other change in urine volume.  Musculoskeletal: Negative for myalgias or other joint swelling.  Skin: Negative for other color change, or other wound or worsening drainage.  Neurological: Negative for other syncope or numbness. Hematological: Negative for other adenopathy or swelling Psychiatric/Behavioral: Negative for hallucinations, other worsening agitation, SI, self-injury, or new decreased concentration All other system neg per pt    Objective:   Physical Exam BP 124/82   Pulse 74   Temp 97.8 F (36.6 C) (Oral)   Ht 6' (1.829 m)   Wt 291 lb (132 kg)   SpO2 95%   BMI 39.47 kg/m  VS noted,  Constitutional: Pt is oriented to person, place, and time. Appears well-developed and well-nourished, in no significant distress and comfortable Head: Normocephalic and atraumatic  Eyes: Conjunctivae and EOM are normal. Pupils are equal, round, and reactive to light Right Ear: External ear normal without discharge Left Ear: External ear normal without discharge Nose: Nose without discharge or deformity Mouth/Throat: Oropharynx is without other ulcerations and moist  Neck: Normal range of motion. Neck supple. No JVD present. No tracheal deviation present or significant neck LA or mass Cardiovascular: Normal rate, regular rhythm, normal heart sounds and intact distal pulses.  Pulmonary/Chest: WOB normal and breath sounds without rales or wheezing  Abdominal: Soft. Bowel sounds are normal. NT. No HSM  Musculoskeletal: Normal range of  motion. Exhibits no edema Lymphadenopathy: Has no other cervical adenopathy.  Neurological: Pt is alert and oriented to person, place, and time. Pt has normal reflexes. No cranial nerve deficit. Motor grossly intact, Gait intact Skin: Skin is warm and dry. No rash noted or new ulcerations Psychiatric:  Has normal mood and affect. Behavior is normal without agitation No other exam findings  Lab Results  Component Value Date   WBC 10.6 (H) 07/22/2017   HGB 12.6 (L) 07/22/2017   HCT 37.5 (L) 07/22/2017   PLT 181.0 07/22/2017   GLUCOSE 105 (H) 07/22/2017   CHOL 150 07/22/2017   TRIG 232.0 (H) 07/22/2017   HDL 30.70 (L) 07/22/2017   LDLDIRECT 93.0 07/22/2017   LDLCALC 71 01/20/2017   ALT 16 07/22/2017   AST 12 07/22/2017   NA 141 07/22/2017   K 4.5 07/22/2017   CL 103 07/22/2017   CREATININE 1.13 07/22/2017   BUN 23 07/22/2017   CO2 30 07/22/2017   TSH 3.03 07/22/2017  PSA 1.03 07/22/2017   HGBA1C 6.3 07/22/2017   MICROALBUR 1.0 07/22/2017        Assessment & Plan:

## 2017-08-01 NOTE — Assessment & Plan Note (Signed)
stable overall by history and exam, recent data reviewed with pt, and pt to continue medical treatment as before,  to f/u any worsening symptoms or concerns Lab Results  Component Value Date   HGBA1C 6.3 07/22/2017   

## 2017-08-01 NOTE — Assessment & Plan Note (Signed)
Lab Results  Component Value Date   LDLCALC 71 01/20/2017  stable overall by history and exam, recent data reviewed with pt, and pt to continue medical treatment as before,  to f/u any worsening symptoms or concerns

## 2017-08-01 NOTE — Assessment & Plan Note (Signed)

## 2017-08-01 NOTE — Assessment & Plan Note (Signed)
stable overall by history and exam, recent data reviewed with pt, and pt to continue medical treatment as before,  to f/u any worsening symptoms or concerns BP Readings from Last 3 Encounters:  07/30/17 124/82  01/30/17 126/82  07/31/16 122/70

## 2017-08-05 ENCOUNTER — Encounter: Payer: Self-pay | Admitting: Internal Medicine

## 2017-08-05 MED ORDER — AMLODIPINE BESYLATE 2.5 MG PO TABS: 2.5000 mg | ORAL_TABLET | Freq: Every day | ORAL | 3 refills | Status: DC

## 2017-08-05 MED ORDER — OMEPRAZOLE 20 MG PO CPDR: 20.0000 mg | DELAYED_RELEASE_CAPSULE | Freq: Every day | ORAL | 3 refills | Status: DC

## 2017-08-28 ENCOUNTER — Other Ambulatory Visit: Payer: Self-pay | Admitting: Internal Medicine

## 2017-09-05 ENCOUNTER — Other Ambulatory Visit: Payer: Self-pay | Admitting: Internal Medicine

## 2017-09-07 NOTE — Telephone Encounter (Signed)
07/12/2017 30# 

## 2017-09-07 NOTE — Telephone Encounter (Signed)
Done erx 

## 2018-01-26 ENCOUNTER — Other Ambulatory Visit (INDEPENDENT_AMBULATORY_CARE_PROVIDER_SITE_OTHER): Payer: Managed Care, Other (non HMO)

## 2018-01-26 DIAGNOSIS — E119 Type 2 diabetes mellitus without complications: Secondary | ICD-10-CM | POA: Diagnosis not present

## 2018-01-26 LAB — BASIC METABOLIC PANEL
BUN: 31 mg/dL — ABNORMAL HIGH (ref 6–23)
CALCIUM: 9.2 mg/dL (ref 8.4–10.5)
CO2: 24 mEq/L (ref 19–32)
Chloride: 102 mEq/L (ref 96–112)
Creatinine, Ser: 1.2 mg/dL (ref 0.40–1.50)
GFR: 65.51 mL/min (ref >60.00)
GLUCOSE: 129 mg/dL — AB (ref 70–99)
Potassium: 4.3 mEq/L (ref 3.5–5.1)
SODIUM: 140 meq/L (ref 135–145)

## 2018-01-26 LAB — LIPID PANEL
Cholesterol: 160 mg/dL (ref 0–200)
HDL: 26 mg/dL — AB (ref >39.00)
NONHDL: 134.38
Total CHOL/HDL Ratio: 6
Triglycerides: 342 mg/dL — ABNORMAL HIGH (ref 0.0–149.0)
VLDL: 68.4 mg/dL — AB (ref 0.0–40.0)

## 2018-01-26 LAB — HEPATIC FUNCTION PANEL
ALK PHOS: 88 U/L (ref 39–117)
ALT: 21 U/L (ref 0–53)
AST: 16 U/L (ref 0–37)
Albumin: 4.4 g/dL (ref 3.5–5.2)
Bilirubin, Direct: 0.1 mg/dL (ref 0.0–0.3)
Total Bilirubin: 0.7 mg/dL (ref 0.2–1.2)
Total Protein: 7.8 g/dL (ref 6.0–8.3)

## 2018-01-26 LAB — HEMOGLOBIN A1C: HEMOGLOBIN A1C: 6.6 % — AB (ref 4.6–6.5)

## 2018-01-26 LAB — LDL CHOLESTEROL, DIRECT: Direct LDL: 91 mg/dL

## 2018-02-01 LAB — HM DIABETES EYE EXAM

## 2018-02-02 ENCOUNTER — Encounter: Payer: Self-pay | Admitting: Internal Medicine

## 2018-02-02 ENCOUNTER — Ambulatory Visit: Payer: Managed Care, Other (non HMO) | Admitting: Internal Medicine

## 2018-02-02 VITALS — BP 128/78 | HR 78 | Ht 72.0 in | Wt 296.0 lb

## 2018-02-02 DIAGNOSIS — Z0000 Encounter for general adult medical examination without abnormal findings: Secondary | ICD-10-CM

## 2018-02-02 DIAGNOSIS — I10 Essential (primary) hypertension: Secondary | ICD-10-CM

## 2018-02-02 DIAGNOSIS — E119 Type 2 diabetes mellitus without complications: Secondary | ICD-10-CM | POA: Diagnosis not present

## 2018-02-02 DIAGNOSIS — Z23 Encounter for immunization: Secondary | ICD-10-CM | POA: Diagnosis not present

## 2018-02-02 DIAGNOSIS — R251 Tremor, unspecified: Secondary | ICD-10-CM | POA: Diagnosis not present

## 2018-02-02 DIAGNOSIS — E785 Hyperlipidemia, unspecified: Secondary | ICD-10-CM | POA: Diagnosis not present

## 2018-02-02 MED ORDER — ATENOLOL 50 MG PO TABS: 50.0000 mg | ORAL_TABLET | Freq: Every day | ORAL | 3 refills | Status: DC

## 2018-02-02 MED ORDER — GLUCOSE BLOOD VI STRP: ORAL_STRIP | 5 refills | Status: DC

## 2018-02-02 MED ORDER — FREESTYLE LITE DEVI: 0 refills | Status: AC

## 2018-02-02 MED ORDER — FREESTYLE LANCETS MISC: 3 refills | Status: AC

## 2018-02-02 NOTE — Progress Notes (Signed)
Subjective:    Patient ID: Joseph Maxwell, male    DOB: 04-Mar-1956, 62 y.o.   MRN: [id number]  HPI   Here to f/u; overall doing ok,  Pt denies chest pain, increasing sob or doe, wheezing, orthopnea, PND, increased LE swelling, palpitations, dizziness or syncope.  Pt denies new neurological symptoms such as new headache, or facial or extremity weakness or numbness.  Pt denies polydipsia, polyuria, or low sugar episode.  Pt states overall good compliance with meds, mostly trying to follow appropriate diet, with wt overall stable,  but little exercise however.  Tremor has been subjectively mild worse recently, asks for increased BB and OT referral.   BP Readings from Last 3 Encounters:  02/02/18 128/78  07/30/17 124/82  01/30/17 126/82   Wt Readings from Last 3 Encounters:  02/02/18 296 lb (134.3 kg)  07/30/17 291 lb (132 kg)  01/30/17 283 lb (128.4 kg)   Past Medical History:  Diagnosis Date  . ALLERGIC RHINITIS   . BACK PAIN   . Cervical disc disease   . DIABETES MELLITUS   . DIVERTICULOSIS, COLON   . ERECTILE DYSFUNCTION   . FAMILIAL TREMOR   . GERD   . HYPERLIPIDEMIA   . HYPERTENSION   . OBSTRUCTIVE SLEEP APNEA   . ONYCHOMYCOSIS, TOENAILS   . PLANTAR FASCIITIS, LEFT   . Unspecified asthma(493.90)    Past Surgical History:  Procedure Laterality Date  . NECK SURGERY  04/08/10   2 fusion and disctecomy/Dr. Wynetta Emery    reports that he has never smoked. He has never used smokeless tobacco. He reports that he drinks alcohol. He reports that he does not use drugs. family history includes Diabetes in his father, mother, and sister; Heart failure in his mother; Hypertension in his other; Lung cancer in his other; Prostate cancer in his father; Sleep apnea in his mother; Stroke in his mother. Allergies  Allergen Reactions  . Hydrocodone     REACTION: anxiety reaction  . Epinephrine Anxiety   Current Outpatient Medications on File Prior to Visit  Medication Sig Dispense Refill  .  ALPRAZolam (XANAX) 0.5 MG tablet TAKE 1 TABLET BY MOUTH TWICE A DAY AS NEEDED 60 tablet 5  . amLODipine (NORVASC) 2.5 MG tablet Take 1 tablet (2.5 mg total) by mouth daily. 90 tablet 3  . aspirin 81 MG tablet Take 81 mg by mouth daily.      Marland Kitchen atenolol (TENORMIN) 25 MG tablet TAKE ONE TABLET BY MOUTH ONE TIME DAILY 90 tablet 2  . atorvastatin (LIPITOR) 80 MG tablet Take 1 tablet (80 mg total) daily by mouth. 90 tablet 3  . glipiZIDE (GLUCOTROL XL) 10 MG 24 hr tablet Take 1 tablet (10 mg total) daily with breakfast by mouth. 90 tablet 3  . lisinopril-hydrochlorothiazide (PRINZIDE,ZESTORETIC) 20-12.5 MG tablet TAKE 2 TABLETS BY MOUTH DAILY. 180 tablet 3  . loratadine (CLARITIN) 10 MG tablet Take 10 mg by mouth as needed.      . metFORMIN (GLUCOPHAGE) 500 MG tablet Take 2 tablets (1,000 mg total) by mouth 2 (two) times daily with a meal. 360 tablet 3  . niacin 500 MG tablet Take 3 tablets (1,500 mg total) by mouth daily with breakfast. 270 tablet 3  . omeprazole (PRILOSEC) 20 MG capsule Take 1 capsule (20 mg total) by mouth daily. 90 capsule 3  . sitaGLIPtin (JANUVIA) 100 MG tablet Take 1 tablet (100 mg total) by mouth daily. 90 tablet 3  . VENTOLIN HFA 108 (90 Base) MCG/ACT inhaler  INHALE TWO PUFFS BY MOUTH FOUR TIMES DAILY AS NEEDED 18 Inhaler 1   No current facility-administered medications on file prior to visit.    Review of Systems  Constitutional: Negative for other unusual diaphoresis or sweats HENT: Negative for ear discharge or swelling Eyes: Negative for other worsening visual disturbances Respiratory: Negative for stridor or other swelling  Gastrointestinal: Negative for worsening distension or other blood Genitourinary: Negative for retention or other urinary change Musculoskeletal: Negative for other MSK pain or swelling Skin: Negative for color change or other new lesions Neurological: Negative for worsening tremors and other numbness  Psychiatric/Behavioral: Negative for  worsening agitation or other fatigue All other system neg per pt    Objective:   Physical Exam BP 128/78   Pulse 78   Ht 6' (1.829 m)   Wt 296 lb (134.3 kg)   SpO2 95%   BMI 40.14 kg/m  VS noted,  Constitutional: Pt appears in NAD HENT: Head: NCAT.  Right Ear: External ear normal.  Left Ear: External ear normal.  Eyes: . Pupils are equal, round, and reactive to light. Conjunctivae and EOM are normal Nose: without d/c or deformity Neck: Neck supple. Gross normal ROM Cardiovascular: Normal rate and regular rhythm.   Pulmonary/Chest: Effort normal and breath sounds without rales or wheezing.  Abd:  Soft, NT, ND, + BS, no organomegaly Neurological: Pt is alert. At baseline orientation, motor grossly intact, RUE tremor noted Skin: Skin is warm. No rashes, other new lesions, no LE edema Psychiatric: Pt behavior is normal without agitation  No other exam findings  Lab Results  Component Value Date   WBC 10.6 (H) 07/22/2017   HGB 12.6 (L) 07/22/2017   HCT 37.5 (L) 07/22/2017   PLT 181.0 07/22/2017   GLUCOSE 129 (H) 01/26/2018   CHOL 160 01/26/2018   TRIG 342.0 (H) 01/26/2018   HDL 26.00 (L) 01/26/2018   LDLDIRECT 91.0 01/26/2018   LDLCALC 71 01/20/2017   ALT 21 01/26/2018   AST 16 01/26/2018   NA 140 01/26/2018   K 4.3 01/26/2018   CL 102 01/26/2018   CREATININE 1.20 01/26/2018   BUN 31 (H) 01/26/2018   CO2 24 01/26/2018   TSH 3.03 07/22/2017   PSA 1.03 07/22/2017   HGBA1C 6.6 (H) 01/26/2018   MICROALBUR 1.0 07/22/2017       Assessment & Plan:

## 2018-02-02 NOTE — Assessment & Plan Note (Signed)
stable overall by history and exam, recent data reviewed with pt, and pt to continue medical treatment as before,  to f/u any worsening symptoms or concerns  

## 2018-02-02 NOTE — Assessment & Plan Note (Signed)
Ok for increased BB to 50 qd,refer OT, consider movement d/o referral

## 2018-02-02 NOTE — Patient Instructions (Addendum)
You had the flu shot today, and Tdap tetanus shot  Ok to increase the atenolol to 50 mg per day  Please continue all other medications as before, and refills have been done if requested.  Please have the pharmacy call with any other refills you may need.  Please continue your efforts at being more active, low cholesterol diet, and weight control.  Please keep your appointments with your specialists as you may have planned  Please return in 6 months, or sooner if needed, with Lab testing done 3-5 days before

## 2018-02-03 ENCOUNTER — Other Ambulatory Visit: Payer: Self-pay | Admitting: Internal Medicine

## 2018-02-10 ENCOUNTER — Encounter: Payer: Self-pay | Admitting: Internal Medicine

## 2018-03-13 ENCOUNTER — Other Ambulatory Visit: Payer: Self-pay | Admitting: Internal Medicine

## 2018-03-15 NOTE — Telephone Encounter (Signed)
Done erx 

## 2018-03-15 NOTE — Telephone Encounter (Signed)
   LOV:02/02/18 NextOV:08/04/18  Last Filled/Quantity:01/13/18 60#

## 2018-04-09 ENCOUNTER — Other Ambulatory Visit: Payer: Self-pay | Admitting: Internal Medicine

## 2018-04-14 ENCOUNTER — Encounter: Payer: Self-pay | Admitting: Internal Medicine

## 2018-04-14 ENCOUNTER — Other Ambulatory Visit: Payer: Self-pay | Admitting: Internal Medicine

## 2018-07-07 ENCOUNTER — Other Ambulatory Visit: Payer: Self-pay | Admitting: Internal Medicine

## 2018-07-15 ENCOUNTER — Encounter: Payer: Self-pay | Admitting: Internal Medicine

## 2018-07-16 MED ORDER — OMEPRAZOLE 20 MG PO CPDR: 20.0000 mg | DELAYED_RELEASE_CAPSULE | Freq: Every day | ORAL | 1 refills | Status: DC

## 2018-07-28 ENCOUNTER — Other Ambulatory Visit (INDEPENDENT_AMBULATORY_CARE_PROVIDER_SITE_OTHER): Payer: Managed Care, Other (non HMO)

## 2018-07-28 DIAGNOSIS — Z0000 Encounter for general adult medical examination without abnormal findings: Secondary | ICD-10-CM

## 2018-07-28 DIAGNOSIS — Z125 Encounter for screening for malignant neoplasm of prostate: Secondary | ICD-10-CM

## 2018-07-28 DIAGNOSIS — E119 Type 2 diabetes mellitus without complications: Secondary | ICD-10-CM

## 2018-07-28 LAB — BASIC METABOLIC PANEL
BUN: 28 mg/dL — ABNORMAL HIGH (ref 6–23)
CO2: 29 mEq/L (ref 19–32)
Calcium: 9.1 mg/dL (ref 8.4–10.5)
Chloride: 99 mEq/L (ref 96–112)
Creatinine, Ser: 1.2 mg/dL (ref 0.40–1.50)
GFR: 61.54 mL/min (ref >60.00)
GLUCOSE: 187 mg/dL — AB (ref 70–99)
Potassium: 4.5 mEq/L (ref 3.5–5.1)
Sodium: 137 mEq/L (ref 135–145)

## 2018-07-28 LAB — URINALYSIS, ROUTINE W REFLEX MICROSCOPIC
Hgb urine dipstick: NEGATIVE
Ketones, ur: NEGATIVE
Leukocytes,Ua: NEGATIVE
Nitrite: NEGATIVE
Urine Glucose: NEGATIVE
Urobilinogen, UA: 1 (ref 0.0–1.0)
pH: 5.5 (ref 5.0–8.0)

## 2018-07-28 LAB — LDL CHOLESTEROL, DIRECT: Direct LDL: 89 mg/dL

## 2018-07-28 LAB — LIPID PANEL
Cholesterol: 160 mg/dL (ref 0–200)
HDL: 31.2 mg/dL — ABNORMAL LOW (ref >39.00)
NonHDL: 128.35
Total CHOL/HDL Ratio: 5
Triglycerides: 254 mg/dL — ABNORMAL HIGH (ref 0.0–149.0)
VLDL: 50.8 mg/dL — ABNORMAL HIGH (ref 0.0–40.0)

## 2018-07-28 LAB — MICROALBUMIN / CREATININE URINE RATIO
Creatinine,U: 373.1 mg/dL
MICROALB UR: 5 mg/dL — AB (ref 0.0–1.9)
Microalb Creat Ratio: 1.3 mg/g (ref 0.0–30.0)

## 2018-07-28 LAB — CBC WITH DIFFERENTIAL/PLATELET
Basophils Absolute: 0.1 10*3/uL (ref 0.0–0.1)
Basophils Relative: 1 % (ref 0.0–3.0)
Eosinophils Absolute: 0.3 10*3/uL (ref 0.0–0.7)
Eosinophils Relative: 3.1 % (ref 0.0–5.0)
HCT: 37.8 % — ABNORMAL LOW (ref 39.0–52.0)
Hemoglobin: 12.8 g/dL — ABNORMAL LOW (ref 13.0–17.0)
LYMPHS PCT: 20 % (ref 12.0–46.0)
Lymphs Abs: 2 10*3/uL (ref 0.7–4.0)
MCHC: 33.7 g/dL (ref 30.0–36.0)
MCV: 78.8 fl (ref 78.0–100.0)
Monocytes Absolute: 0.8 10*3/uL (ref 0.1–1.0)
Monocytes Relative: 8.2 % (ref 3.0–12.0)
Neutro Abs: 6.8 10*3/uL (ref 1.4–7.7)
Neutrophils Relative %: 67.7 % (ref 43.0–77.0)
Platelets: 169 10*3/uL (ref 150.0–400.0)
RBC: 4.8 Mil/uL (ref 4.22–5.81)
RDW: 14.8 % (ref 11.5–15.5)
WBC: 10 10*3/uL (ref 4.0–10.5)

## 2018-07-28 LAB — HEPATIC FUNCTION PANEL
ALT: 23 U/L (ref 0–53)
AST: 18 U/L (ref 0–37)
Albumin: 4.3 g/dL (ref 3.5–5.2)
Alkaline Phosphatase: 91 U/L (ref 39–117)
Bilirubin, Direct: 0.1 mg/dL (ref 0.0–0.3)
Total Bilirubin: 0.6 mg/dL (ref 0.2–1.2)
Total Protein: 7.1 g/dL (ref 6.0–8.3)

## 2018-07-28 LAB — TSH: TSH: 3.09 u[IU]/mL (ref 0.35–4.50)

## 2018-07-28 LAB — HEMOGLOBIN A1C: HEMOGLOBIN A1C: 7.1 % — AB (ref 4.6–6.5)

## 2018-07-28 LAB — PSA: PSA: 0.98 ng/mL (ref 0.10–4.00)

## 2018-08-03 ENCOUNTER — Telehealth: Payer: Self-pay

## 2018-08-03 NOTE — Telephone Encounter (Signed)
Called pt, LVM.   Pt needs to be screened for covid-19 symptoms. If no symptoms pt may keep appt. If symptoms are present pt needs to be advised to stay home if symptoms are present due to the office trying to limit the exposure to staff and other patients.   Pt needs to be advised that no additional visitors can come to the OV.

## 2018-08-04 ENCOUNTER — Other Ambulatory Visit: Payer: Self-pay

## 2018-08-04 ENCOUNTER — Ambulatory Visit (INDEPENDENT_AMBULATORY_CARE_PROVIDER_SITE_OTHER): Payer: Managed Care, Other (non HMO) | Admitting: Internal Medicine

## 2018-08-04 ENCOUNTER — Encounter: Payer: Self-pay | Admitting: Internal Medicine

## 2018-08-04 VITALS — BP 136/84 | HR 74 | Temp 97.7°F | Ht 72.0 in | Wt 298.0 lb

## 2018-08-04 DIAGNOSIS — E119 Type 2 diabetes mellitus without complications: Secondary | ICD-10-CM

## 2018-08-04 DIAGNOSIS — Z0000 Encounter for general adult medical examination without abnormal findings: Secondary | ICD-10-CM | POA: Diagnosis not present

## 2018-08-04 NOTE — Assessment & Plan Note (Signed)
stable overall by history and exam, recent data reviewed with pt, and pt to continue medical treatment as before,  to f/u any worsening symptoms or concerns  

## 2018-08-04 NOTE — Patient Instructions (Signed)
Please continue all other medications as before, and refills have been done if requested.  Please have the pharmacy call with any other refills you may need.  Please continue your efforts at being more active, low cholesterol diet, and weight control.  You are otherwise up to date with prevention measures today.  Please keep your appointments with your specialists as you may have planned  Please return in 6 months, or sooner if needed, with Lab testing done 3-5 days before  

## 2018-08-04 NOTE — Assessment & Plan Note (Signed)

## 2018-08-04 NOTE — Progress Notes (Signed)
Subjective:    Patient ID: Joseph Maxwell, male    DOB: [birth year], 63 y.o.   MRN: [id number]  HPI  Here for wellness and f/u;  Overall doing ok;  Pt denies Chest pain, worsening SOB, DOE, wheezing, orthopnea, PND, worsening LE edema, palpitations, dizziness or syncope.  Pt denies neurological change such as new headache, facial or extremity weakness.  Pt denies polydipsia, polyuria, or low sugar symptoms. Pt states overall good compliance with treatment and medications, good tolerability, and has been trying to follow appropriate diet.  Pt denies worsening depressive symptoms, suicidal ideation or panic. No fever, night sweats, wt loss, loss of appetite, or other constitutional symptoms.  Pt states good ability with ADL's, has low fall risk, home safety reviewed and adequate, no other significant changes in hearing or vision, and not active with exercise.  No new complaints Past Medical History:  Diagnosis Date  . ALLERGIC RHINITIS   . BACK PAIN   . Cervical disc disease   . DIABETES MELLITUS   . DIVERTICULOSIS, COLON   . ERECTILE DYSFUNCTION   . FAMILIAL TREMOR   . GERD   . HYPERLIPIDEMIA   . HYPERTENSION   . OBSTRUCTIVE SLEEP APNEA   . ONYCHOMYCOSIS, TOENAILS   . PLANTAR FASCIITIS, LEFT   . Unspecified asthma(493.90)    Past Surgical History:  Procedure Laterality Date  . NECK SURGERY  04/08/10   2 fusion and disctecomy/Dr. Wynetta Emery    reports that he has never smoked. He has never used smokeless tobacco. He reports current alcohol use. He reports that he does not use drugs. family history includes Diabetes in his father, mother, and sister; Heart failure in his mother; Hypertension in an other family member; Lung cancer in an other family member; Prostate cancer in his father; Sleep apnea in his mother; Stroke in his mother. Allergies  Allergen Reactions  . Hydrocodone     REACTION: anxiety reaction  . Epinephrine Anxiety   Current Outpatient Medications on File Prior to Visit   Medication Sig Dispense Refill  . ALPRAZolam (XANAX) 0.5 MG tablet TAKE 1 TABLET BY MOUTH TWICE A DAY AS NEEDED 60 tablet 5  . amLODipine (NORVASC) 2.5 MG tablet Take 1 tablet (2.5 mg total) by mouth daily. 90 tablet 3  . aspirin 81 MG tablet Take 81 mg by mouth daily.      Marland Kitchen atenolol (TENORMIN) 50 MG tablet Take 1 tablet (50 mg total) by mouth daily. 90 tablet 3  . atorvastatin (LIPITOR) 80 MG tablet TAKE 1 TABLET (80 MG TOTAL) DAILY BY MOUTH. 90 tablet 1  . Blood Glucose Monitoring Suppl (FREESTYLE LITE) DEVI Use as directed twice per day E11.9 1 each 0  . glipiZIDE (GLUCOTROL XL) 10 MG 24 hr tablet TAKE 1 TABLET (10 MG TOTAL) DAILY WITH BREAKFAST BY MOUTH. 90 tablet 1  . glucose blood (FREESTYLE LITE) test strip Use to check blood sugar twice a day 200 each 5  . JANUVIA 100 MG tablet TAKE 1 TABLET BY MOUTH EVERY DAY 90 tablet 1  . Lancets (FREESTYLE) lancets Use as directed twice per day E11.9 100 each 3  . lisinopril-hydrochlorothiazide (PRINZIDE,ZESTORETIC) 20-12.5 MG tablet TAKE 2 TABLETS BY MOUTH DAILY. 180 tablet 3  . loratadine (CLARITIN) 10 MG tablet Take 10 mg by mouth as needed.      . metFORMIN (GLUCOPHAGE) 500 MG tablet TAKE 2 TABLETS (1,000 MG TOTAL) BY MOUTH 2 (TWO) TIMES DAILY WITH A MEAL. 360 tablet 1  . niacin 500 MG  tablet Take 3 tablets (1,500 mg total) by mouth daily with breakfast. 270 tablet 3  . omeprazole (PRILOSEC) 20 MG capsule Take 1 capsule (20 mg total) by mouth daily. 90 capsule 1  . VENTOLIN HFA 108 (90 Base) MCG/ACT inhaler INHALE TWO PUFFS BY MOUTH FOUR TIMES DAILY AS NEEDED 18 Inhaler 1   No current facility-administered medications on file prior to visit.    Review of Systems Constitutional: Negative for other unusual diaphoresis, sweats, appetite or weight changes HENT: Negative for other worsening hearing loss, ear pain, facial swelling, mouth sores or neck stiffness.   Eyes: Negative for other worsening pain, redness or other visual disturbance.   Respiratory: Negative for other stridor or swelling Cardiovascular: Negative for other palpitations or other chest pain  Gastrointestinal: Negative for worsening diarrhea or loose stools, blood in stool, distention or other pain Genitourinary: Negative for hematuria, flank pain or other change in urine volume.  Musculoskeletal: Negative for myalgias or other joint swelling.  Skin: Negative for other color change, or other wound or worsening drainage.  Neurological: Negative for other syncope or numbness. Hematological: Negative for other adenopathy or swelling Psychiatric/Behavioral: Negative for hallucinations, other worsening agitation, SI, self-injury, or new decreased concentration All other system neg per pt    Objective:   Physical Exam BP 136/84   Pulse 74   Temp 97.7 F (36.5 C) (Oral)   Ht 6' (1.829 m)   Wt 298 lb (135.2 kg)   SpO2 93%   BMI 40.42 kg/m  VS noted, morbid obese Constitutional: Pt is oriented to person, place, and time. Appears well-developed and well-nourished, in no significant distress and comfortable Head: Normocephalic and atraumatic  Eyes: Conjunctivae and EOM are normal. Pupils are equal, round, and reactive to light Right Ear: External ear normal without discharge Left Ear: External ear normal without discharge Nose: Nose without discharge or deformity Mouth/Throat: Oropharynx is without other ulcerations and moist  Neck: Normal range of motion. Neck supple. No JVD present. No tracheal deviation present or significant neck LA or mass Cardiovascular: Normal rate, regular rhythm, normal heart sounds and intact distal pulses.   Pulmonary/Chest: WOB normal and breath sounds without rales or wheezing  Abdominal: Soft. Bowel sounds are normal. NT. No HSM  Musculoskeletal: Normal range of motion. Exhibits no edema Lymphadenopathy: Has no other cervical adenopathy.  Neurological: Pt is alert and oriented to person, place, and time. Pt has normal reflexes.  No cranial nerve deficit. Motor grossly intact, Gait intact Skin: Skin is warm and dry. No rash noted or new ulcerations Psychiatric:  Has normal mood and affect. Behavior is normal without agitation No other exam findings Lab Results  Component Value Date   WBC 10.0 07/28/2018   HGB 12.8 (L) 07/28/2018   HCT 37.8 (L) 07/28/2018   PLT 169.0 07/28/2018   GLUCOSE 187 (H) 07/28/2018   CHOL 160 07/28/2018   TRIG 254.0 (H) 07/28/2018   HDL 31.20 (L) 07/28/2018   LDLDIRECT 89.0 07/28/2018   LDLCALC 71 01/20/2017   ALT 23 07/28/2018   AST 18 07/28/2018   NA 137 07/28/2018   K 4.5 07/28/2018   CL 99 07/28/2018   CREATININE 1.20 07/28/2018   BUN 28 (H) 07/28/2018   CO2 29 07/28/2018   TSH 3.09 07/28/2018   PSA 0.98 07/28/2018   HGBA1C 7.1 (H) 07/28/2018   MICROALBUR 5.0 (H) 07/28/2018         Assessment & Plan:

## 2018-08-06 ENCOUNTER — Other Ambulatory Visit: Payer: Self-pay | Admitting: Internal Medicine

## 2018-08-23 ENCOUNTER — Other Ambulatory Visit: Payer: Self-pay | Admitting: Internal Medicine

## 2018-09-07 ENCOUNTER — Other Ambulatory Visit: Payer: Self-pay | Admitting: Internal Medicine

## 2018-09-25 ENCOUNTER — Other Ambulatory Visit: Payer: Self-pay | Admitting: Internal Medicine

## 2018-10-06 ENCOUNTER — Other Ambulatory Visit: Payer: Self-pay | Admitting: Internal Medicine

## 2018-10-07 NOTE — Telephone Encounter (Signed)
Done erx 

## 2018-10-13 ENCOUNTER — Other Ambulatory Visit: Payer: Self-pay | Admitting: Internal Medicine

## 2019-01-06 ENCOUNTER — Other Ambulatory Visit: Payer: Self-pay | Admitting: Internal Medicine

## 2019-01-22 ENCOUNTER — Other Ambulatory Visit: Payer: Self-pay | Admitting: Internal Medicine

## 2019-01-28 ENCOUNTER — Other Ambulatory Visit (INDEPENDENT_AMBULATORY_CARE_PROVIDER_SITE_OTHER): Payer: Managed Care, Other (non HMO)

## 2019-01-28 DIAGNOSIS — E119 Type 2 diabetes mellitus without complications: Secondary | ICD-10-CM | POA: Diagnosis not present

## 2019-01-28 LAB — BASIC METABOLIC PANEL
BUN: 29 mg/dL — ABNORMAL HIGH (ref 6–23)
CO2: 23 mEq/L (ref 19–32)
Calcium: 9.2 mg/dL (ref 8.4–10.5)
Chloride: 102 mEq/L (ref 96–112)
Creatinine, Ser: 1.33 mg/dL (ref 0.40–1.50)
GFR: 54.56 mL/min — ABNORMAL LOW (ref >60.00)
Glucose, Bld: 150 mg/dL — ABNORMAL HIGH (ref 70–99)
Potassium: 4 mEq/L (ref 3.5–5.1)
Sodium: 138 mEq/L (ref 135–145)

## 2019-01-28 LAB — HEPATIC FUNCTION PANEL
ALT: 27 U/L (ref 0–53)
AST: 19 U/L (ref 0–37)
Albumin: 4.4 g/dL (ref 3.5–5.2)
Alkaline Phosphatase: 90 U/L (ref 39–117)
Bilirubin, Direct: 0 mg/dL (ref 0.0–0.3)
Total Bilirubin: 0.5 mg/dL (ref 0.2–1.2)
Total Protein: 7.8 g/dL (ref 6.0–8.3)

## 2019-01-28 LAB — LIPID PANEL
Cholesterol: 154 mg/dL (ref 0–200)
HDL: 29.1 mg/dL — ABNORMAL LOW (ref >39.00)
NonHDL: 125.07
Total CHOL/HDL Ratio: 5
Triglycerides: 284 mg/dL — ABNORMAL HIGH (ref 0.0–149.0)
VLDL: 56.8 mg/dL — ABNORMAL HIGH (ref 0.0–40.0)

## 2019-01-28 LAB — LDL CHOLESTEROL, DIRECT: Direct LDL: 87 mg/dL

## 2019-01-28 LAB — HEMOGLOBIN A1C: Hgb A1c MFr Bld: 7.1 % — ABNORMAL HIGH (ref 4.6–6.5)

## 2019-02-02 LAB — HM DIABETES EYE EXAM

## 2019-02-04 ENCOUNTER — Ambulatory Visit (INDEPENDENT_AMBULATORY_CARE_PROVIDER_SITE_OTHER): Payer: Managed Care, Other (non HMO) | Admitting: Internal Medicine

## 2019-02-04 ENCOUNTER — Other Ambulatory Visit: Payer: Self-pay

## 2019-02-04 ENCOUNTER — Encounter: Payer: Self-pay | Admitting: Internal Medicine

## 2019-02-04 VITALS — BP 108/70 | HR 71 | Temp 98.7°F | Ht 72.0 in | Wt 297.0 lb

## 2019-02-04 DIAGNOSIS — Z0000 Encounter for general adult medical examination without abnormal findings: Secondary | ICD-10-CM | POA: Diagnosis not present

## 2019-02-04 DIAGNOSIS — E119 Type 2 diabetes mellitus without complications: Secondary | ICD-10-CM

## 2019-02-04 DIAGNOSIS — Z23 Encounter for immunization: Secondary | ICD-10-CM | POA: Diagnosis not present

## 2019-02-04 DIAGNOSIS — E611 Iron deficiency: Secondary | ICD-10-CM

## 2019-02-04 DIAGNOSIS — I10 Essential (primary) hypertension: Secondary | ICD-10-CM | POA: Diagnosis not present

## 2019-02-04 DIAGNOSIS — E785 Hyperlipidemia, unspecified: Secondary | ICD-10-CM | POA: Diagnosis not present

## 2019-02-04 DIAGNOSIS — E559 Vitamin D deficiency, unspecified: Secondary | ICD-10-CM

## 2019-02-04 DIAGNOSIS — E538 Deficiency of other specified B group vitamins: Secondary | ICD-10-CM

## 2019-02-04 MED ORDER — ALBUTEROL SULFATE HFA 108 (90 BASE) MCG/ACT IN AERS: INHALATION_SPRAY | RESPIRATORY_TRACT | 11 refills | Status: DC

## 2019-02-04 MED ORDER — LISINOPRIL-HYDROCHLOROTHIAZIDE 20-12.5 MG PO TABS: 2.0000 | ORAL_TABLET | Freq: Every day | ORAL | 3 refills | Status: DC

## 2019-02-04 MED ORDER — ALPRAZOLAM 0.5 MG PO TABS: 0.5000 mg | ORAL_TABLET | Freq: Two times a day (BID) | ORAL | 5 refills | Status: DC | PRN

## 2019-02-04 MED ORDER — METFORMIN HCL 500 MG PO TABS: 1000.0000 mg | ORAL_TABLET | Freq: Two times a day (BID) | ORAL | 3 refills | Status: DC

## 2019-02-04 MED ORDER — AMLODIPINE BESYLATE 2.5 MG PO TABS: 2.5000 mg | ORAL_TABLET | Freq: Every day | ORAL | 3 refills | Status: DC

## 2019-02-04 MED ORDER — ATENOLOL 50 MG PO TABS: 50.0000 mg | ORAL_TABLET | Freq: Every day | ORAL | 3 refills | Status: DC

## 2019-02-04 MED ORDER — SITAGLIPTIN PHOSPHATE 100 MG PO TABS: 100.0000 mg | ORAL_TABLET | Freq: Every day | ORAL | 3 refills | Status: DC

## 2019-02-04 MED ORDER — ATORVASTATIN CALCIUM 80 MG PO TABS: 80.0000 mg | ORAL_TABLET | Freq: Every day | ORAL | 3 refills | Status: DC

## 2019-02-04 NOTE — Assessment & Plan Note (Signed)
stable overall by history and exam, recent data reviewed with pt, and pt to continue medical treatment as before,  to f/u any worsening symptoms or concerns  

## 2019-02-04 NOTE — Patient Instructions (Addendum)
You had the flu shot today  Please continue all other medications as before, and refills have been done if requested.  Please have the pharmacy call with any other refills you may need.  Please continue your efforts at being more active, low cholesterol diet, and weight control.  You are otherwise up to date with prevention measures today.  Please keep your appointments with your specialists as you may have planned  Please call if you change your mind about having the Cardiac CT Score  Please return in 6 months, or sooner if needed, with Lab testing done 3-5 days before

## 2019-02-04 NOTE — Progress Notes (Signed)
Subjective:    Patient ID: Joseph Maxwell, male    DOB: 05/06/1956, 63 y.o.   MRN: [id number]  HPI    Here to f/u; overall doing ok,  Pt denies chest pain, increasing sob or doe, wheezing, orthopnea, PND, increased LE swelling, palpitations, dizziness or syncope.  Pt denies new neurological symptoms such as new headache, or facial or extremity weakness or numbness.  Pt denies polydipsia, polyuria, or low sugar episode.  Pt states overall good compliance with meds, mostly trying to follow appropriate diet, with wt overall stable,  but little exercise however. No other new complaints Wt Readings from Last 3 Encounters:  02/04/19 297 lb (134.7 kg)  08/04/18 298 lb (135.2 kg)  02/02/18 296 lb (134.3 kg)   BP Readings from Last 3 Encounters:  02/04/19 108/70  08/04/18 136/84  02/02/18 128/78   Past Medical History:  Diagnosis Date  . ALLERGIC RHINITIS   . BACK PAIN   . Cervical disc disease   . DIABETES MELLITUS   . DIVERTICULOSIS, COLON   . ERECTILE DYSFUNCTION   . FAMILIAL TREMOR   . GERD   . HYPERLIPIDEMIA   . HYPERTENSION   . OBSTRUCTIVE SLEEP APNEA   . ONYCHOMYCOSIS, TOENAILS   . PLANTAR FASCIITIS, LEFT   . Unspecified asthma(493.90)    Past Surgical History:  Procedure Laterality Date  . NECK SURGERY  04/08/10   2 fusion and disctecomy/Dr. Wynetta Emery    reports that he has never smoked. He has never used smokeless tobacco. He reports current alcohol use. He reports that he does not use drugs. family history includes Diabetes in his father, mother, and sister; Heart failure in his mother; Hypertension in an other family member; Lung cancer in an other family member; Prostate cancer in his father; Sleep apnea in his mother; Stroke in his mother. Allergies  Allergen Reactions  . Hydrocodone     REACTION: anxiety reaction  . Epinephrine Anxiety   Current Outpatient Medications on File Prior to Visit  Medication Sig Dispense Refill  . albuterol (PROVENTIL HFA;VENTOLIN HFA) 108  (90 Base) MCG/ACT inhaler INHALE TWO PUFFS BY MOUTH FOUR TIMES DAILY AS NEEDED 8.5 Inhaler 2  . ALPRAZolam (XANAX) 0.5 MG tablet TAKE 1 TABLET BY MOUTH TWICE A DAY AS NEEDED 60 tablet 5  . amLODipine (NORVASC) 2.5 MG tablet TAKE 1 TABLET BY MOUTH EVERY DAY 90 tablet 1  . aspirin 81 MG tablet Take 81 mg by mouth daily.      Marland Kitchen atenolol (TENORMIN) 50 MG tablet Take 1 tablet (50 mg total) by mouth daily. 90 tablet 3  . atorvastatin (LIPITOR) 80 MG tablet TAKE 1 TABLET BY MOUTH EVERY DAY 90 tablet 0  . Blood Glucose Monitoring Suppl (FREESTYLE LITE) DEVI Use as directed twice per day E11.9 1 each 0  . glipiZIDE (GLUCOTROL XL) 10 MG 24 hr tablet TAKE 1 TABLET (10 MG TOTAL) DAILY WITH BREAKFAST BY MOUTH. 90 tablet 2  . glucose blood (FREESTYLE LITE) test strip Use to check blood sugar twice a day 200 each 5  . JANUVIA 100 MG tablet TAKE 1 TABLET BY MOUTH EVERY DAY 90 tablet 0  . Lancets (FREESTYLE) lancets Use as directed twice per day E11.9 100 each 3  . lisinopril-hydrochlorothiazide (ZESTORETIC) 20-12.5 MG tablet TAKE 2 TABLETS BY MOUTH DAILY. 180 tablet 1  . loratadine (CLARITIN) 10 MG tablet Take 10 mg by mouth as needed.      . metFORMIN (GLUCOPHAGE) 500 MG tablet TAKE 2 TABLETS (1,000  MG TOTAL) BY MOUTH 2 (TWO) TIMES DAILY WITH A MEAL. 360 tablet 1  . niacin 500 MG tablet Take 3 tablets (1,500 mg total) by mouth daily with breakfast. 270 tablet 3  . omeprazole (PRILOSEC) 20 MG capsule TAKE ONE CAPSULE BY MOUTH DAILY 90 capsule 0   No current facility-administered medications on file prior to visit.    Review of Systems  Constitutional: Negative for other unusual diaphoresis or sweats HENT: Negative for ear discharge or swelling Eyes: Negative for other worsening visual disturbances Respiratory: Negative for stridor or other swelling  Gastrointestinal: Negative for worsening distension or other blood Genitourinary: Negative for retention or other urinary change Musculoskeletal: Negative for  other MSK pain or swelling Skin: Negative for color change or other new lesions Neurological: Negative for worsening tremors and other numbness  Psychiatric/Behavioral: Negative for worsening agitation or other fatigue All otherwise neg per pt     Objective:   Physical Exam BP 108/70 (BP Location: Left Arm, Patient Position: Sitting, Cuff Size: Large)   Pulse 71   Temp 98.7 F (37.1 C) (Oral)   Ht 6' (1.829 m)   Wt 297 lb (134.7 kg)   SpO2 95%   BMI 40.28 kg/m  VS noted,  Constitutional: Pt appears in NAD HENT: Head: NCAT.  Right Ear: External ear normal.  Left Ear: External ear normal.  Eyes: . Pupils are equal, round, and reactive to light. Conjunctivae and EOM are normal Nose: without d/c or deformity Neck: Neck supple. Gross normal ROM Cardiovascular: Normal rate and regular rhythm.   Pulmonary/Chest: Effort normal and breath sounds without rales or wheezing.  Abd:  Soft, NT, ND, + BS, no organomegaly Neurological: Pt is alert. At baseline orientation, motor grossly intact Skin: Skin is warm. No rashes, other new lesions, no LE edema Psychiatric: Pt behavior is normal without agitation  All otherwise neg per pt  Lab Results  Component Value Date   WBC 10.0 07/28/2018   HGB 12.8 (L) 07/28/2018   HCT 37.8 (L) 07/28/2018   PLT 169.0 07/28/2018   GLUCOSE 150 (H) 01/28/2019   CHOL 154 01/28/2019   TRIG 284.0 (H) 01/28/2019   HDL 29.10 (L) 01/28/2019   LDLDIRECT 87.0 01/28/2019   LDLCALC 71 01/20/2017   ALT 27 01/28/2019   AST 19 01/28/2019   NA 138 01/28/2019   K 4.0 01/28/2019   CL 102 01/28/2019   CREATININE 1.33 01/28/2019   BUN 29 (H) 01/28/2019   CO2 23 01/28/2019   TSH 3.09 07/28/2018   PSA 0.98 07/28/2018   HGBA1C 7.1 (H) 01/28/2019   MICROALBUR 5.0 (H) 07/28/2018      Assessment & Plan:

## 2019-05-05 ENCOUNTER — Other Ambulatory Visit: Payer: Self-pay | Admitting: Internal Medicine

## 2019-06-22 ENCOUNTER — Other Ambulatory Visit: Payer: Self-pay | Admitting: Internal Medicine

## 2019-07-28 ENCOUNTER — Other Ambulatory Visit (INDEPENDENT_AMBULATORY_CARE_PROVIDER_SITE_OTHER): Payer: BC Managed Care – PPO

## 2019-07-28 DIAGNOSIS — E538 Deficiency of other specified B group vitamins: Secondary | ICD-10-CM | POA: Diagnosis not present

## 2019-07-28 DIAGNOSIS — E611 Iron deficiency: Secondary | ICD-10-CM

## 2019-07-28 DIAGNOSIS — Z125 Encounter for screening for malignant neoplasm of prostate: Secondary | ICD-10-CM

## 2019-07-28 DIAGNOSIS — Z0000 Encounter for general adult medical examination without abnormal findings: Secondary | ICD-10-CM | POA: Diagnosis not present

## 2019-07-28 DIAGNOSIS — E119 Type 2 diabetes mellitus without complications: Secondary | ICD-10-CM | POA: Diagnosis not present

## 2019-07-28 DIAGNOSIS — E559 Vitamin D deficiency, unspecified: Secondary | ICD-10-CM | POA: Diagnosis not present

## 2019-07-28 LAB — URINALYSIS, ROUTINE W REFLEX MICROSCOPIC
Bilirubin Urine: NEGATIVE
Hgb urine dipstick: NEGATIVE
Ketones, ur: NEGATIVE
Leukocytes,Ua: NEGATIVE
Nitrite: NEGATIVE
Specific Gravity, Urine: >=1.03 — AB (ref 1.000–1.030)
Urine Glucose: NEGATIVE
Urobilinogen, UA: 4 — AB (ref 0.0–1.0)
pH: 5.5 (ref 5.0–8.0)

## 2019-07-28 LAB — LIPID PANEL
Cholesterol: 164 mg/dL (ref 0–200)
HDL: 30.3 mg/dL — ABNORMAL LOW (ref >39.00)
NonHDL: 134
Total CHOL/HDL Ratio: 5
Triglycerides: 292 mg/dL — ABNORMAL HIGH (ref 0.0–149.0)
VLDL: 58.4 mg/dL — ABNORMAL HIGH (ref 0.0–40.0)

## 2019-07-28 LAB — CBC WITH DIFFERENTIAL/PLATELET
Basophils Absolute: 0.1 10*3/uL (ref 0.0–0.1)
Basophils Relative: 1.1 % (ref 0.0–3.0)
Eosinophils Absolute: 0.3 10*3/uL (ref 0.0–0.7)
Eosinophils Relative: 2.6 % (ref 0.0–5.0)
HCT: 40 % (ref 39.0–52.0)
Hemoglobin: 13 g/dL (ref 13.0–17.0)
Lymphocytes Relative: 24.2 % (ref 12.0–46.0)
Lymphs Abs: 2.7 10*3/uL (ref 0.7–4.0)
MCHC: 32.4 g/dL (ref 30.0–36.0)
MCV: 80.2 fl (ref 78.0–100.0)
Monocytes Absolute: 0.9 10*3/uL (ref 0.1–1.0)
Monocytes Relative: 7.8 % (ref 3.0–12.0)
Neutro Abs: 7.3 10*3/uL (ref 1.4–7.7)
Neutrophils Relative %: 64.3 % (ref 43.0–77.0)
Platelets: 181 10*3/uL (ref 150.0–400.0)
RBC: 4.99 Mil/uL (ref 4.22–5.81)
RDW: 15.1 % (ref 11.5–15.5)
WBC: 11.3 10*3/uL — ABNORMAL HIGH (ref 4.0–10.5)

## 2019-07-28 LAB — LDL CHOLESTEROL, DIRECT: Direct LDL: 97 mg/dL

## 2019-07-28 LAB — BASIC METABOLIC PANEL
BUN: 30 mg/dL — ABNORMAL HIGH (ref 6–23)
CO2: 27 mEq/L (ref 19–32)
Calcium: 8.9 mg/dL (ref 8.4–10.5)
Chloride: 103 mEq/L (ref 96–112)
Creatinine, Ser: 1.25 mg/dL (ref 0.40–1.50)
GFR: 58.51 mL/min — ABNORMAL LOW (ref >60.00)
Glucose, Bld: 120 mg/dL — ABNORMAL HIGH (ref 70–99)
Potassium: 4.6 mEq/L (ref 3.5–5.1)
Sodium: 138 mEq/L (ref 135–145)

## 2019-07-28 LAB — HEMOGLOBIN A1C: Hgb A1c MFr Bld: 6.3 % (ref 4.6–6.5)

## 2019-07-28 LAB — HEPATIC FUNCTION PANEL
ALT: 20 U/L (ref 0–53)
AST: 16 U/L (ref 0–37)
Albumin: 4.2 g/dL (ref 3.5–5.2)
Alkaline Phosphatase: 91 U/L (ref 39–117)
Bilirubin, Direct: 0.1 mg/dL (ref 0.0–0.3)
Total Bilirubin: 0.6 mg/dL (ref 0.2–1.2)
Total Protein: 7.2 g/dL (ref 6.0–8.3)

## 2019-07-28 LAB — MICROALBUMIN / CREATININE URINE RATIO
Creatinine,U: 367.7 mg/dL
Microalb Creat Ratio: 1.4 mg/g (ref 0.0–30.0)
Microalb, Ur: 5 mg/dL — ABNORMAL HIGH (ref 0.0–1.9)

## 2019-07-28 LAB — IBC PANEL
Iron: 42 ug/dL (ref 42–165)
Saturation Ratios: 14.2 % — ABNORMAL LOW (ref 20.0–50.0)
Transferrin: 212 mg/dL (ref 212.0–360.0)

## 2019-07-28 LAB — VITAMIN D 25 HYDROXY (VIT D DEFICIENCY, FRACTURES): VITD: 26.87 ng/mL — ABNORMAL LOW (ref 30.00–100.00)

## 2019-07-28 LAB — TSH: TSH: 2.9 u[IU]/mL (ref 0.35–4.50)

## 2019-07-28 LAB — VITAMIN B12: Vitamin B-12: 198 pg/mL — ABNORMAL LOW (ref 211–911)

## 2019-07-28 LAB — PSA: PSA: 1.22 ng/mL (ref 0.10–4.00)

## 2019-08-01 ENCOUNTER — Other Ambulatory Visit: Payer: Self-pay | Admitting: Internal Medicine

## 2019-08-01 MED ORDER — OMEPRAZOLE 20 MG PO CPDR: 20.0000 mg | DELAYED_RELEASE_CAPSULE | Freq: Every day | ORAL | 0 refills | Status: DC

## 2019-08-04 ENCOUNTER — Other Ambulatory Visit: Payer: Self-pay

## 2019-08-04 ENCOUNTER — Encounter: Payer: Self-pay | Admitting: Internal Medicine

## 2019-08-04 ENCOUNTER — Ambulatory Visit: Payer: BC Managed Care – PPO | Admitting: Internal Medicine

## 2019-08-04 VITALS — BP 118/74 | HR 66 | Temp 98.4°F | Ht 72.0 in | Wt 290.0 lb

## 2019-08-04 DIAGNOSIS — E559 Vitamin D deficiency, unspecified: Secondary | ICD-10-CM

## 2019-08-04 DIAGNOSIS — E119 Type 2 diabetes mellitus without complications: Secondary | ICD-10-CM | POA: Diagnosis not present

## 2019-08-04 DIAGNOSIS — E538 Deficiency of other specified B group vitamins: Secondary | ICD-10-CM

## 2019-08-04 DIAGNOSIS — Z0000 Encounter for general adult medical examination without abnormal findings: Secondary | ICD-10-CM

## 2019-08-04 MED ORDER — VITAMIN B-12 1000 MCG PO TABS: 1000.0000 ug | ORAL_TABLET | Freq: Every day | ORAL | 3 refills | Status: DC

## 2019-08-04 MED ORDER — VITAMIN D (ERGOCALCIFEROL) 1.25 MG (50000 UNIT) PO CAPS: 50000.0000 [IU] | ORAL_CAPSULE | ORAL | 0 refills | Status: DC

## 2019-08-04 NOTE — Patient Instructions (Addendum)
Please take Vitamin D 04045 units weekly for 12 weeks, then plan to change to OTC Vitamin D3 at 2000 units per day, indefinitely.  Please take OTC B12 1000 mg per day  Please continue all other medications as before, and refills have been done if requested.  Please have the pharmacy call with any other refills you may need.  Please continue your efforts at being more active, low cholesterol diet, and weight control.  You are otherwise up to date with prevention measures today.  Please keep your appointments with your specialists as you may have planned  Please make an Appointment to return in 6 months, or sooner if needed, also with Lab Appointment for testing done 3-5 days before at the FIRST FLOOR Lab (so this is for TWO appointments - please see the scheduling desk as you leave)

## 2019-08-04 NOTE — Progress Notes (Signed)
Subjective:    Patient ID: Joseph Maxwell, male    DOB: 1955/10/30, 64 y.o.   MRN: [id number]  HPI  Here for wellness and f/u;  Overall doing ok;  Pt denies Chest pain, worsening SOB, DOE, wheezing, orthopnea, PND, worsening LE edema, palpitations, dizziness or syncope.  Pt denies neurological change such as new headache, facial or extremity weakness.  Pt denies polydipsia, polyuria, or low sugar symptoms. Pt states overall good compliance with treatment and medications, good tolerability, and has been trying to follow appropriate diet.  Pt denies worsening depressive symptoms, suicidal ideation or panic. No fever, night sweats, wt loss, loss of appetite, or other constitutional symptoms.  Pt states good ability with ADL's, has low fall risk, home safety reviewed and adequate, no other significant changes in hearing or vision, and only occasionally active with exercise. Lost wt with better diet.   Wt Readings from Last 3 Encounters:  08/04/19 290 lb (131.5 kg)  02/04/19 297 lb (134.7 kg)  08/04/18 298 lb (135.2 kg)  Right foot plantar facstiis acting up again.   Past Medical History:  Diagnosis Date  . ALLERGIC RHINITIS   . BACK PAIN   . Cervical disc disease   . DIABETES MELLITUS   . DIVERTICULOSIS, COLON   . ERECTILE DYSFUNCTION   . FAMILIAL TREMOR   . GERD   . HYPERLIPIDEMIA   . HYPERTENSION   . OBSTRUCTIVE SLEEP APNEA   . ONYCHOMYCOSIS, TOENAILS   . PLANTAR FASCIITIS, LEFT   . Unspecified asthma(493.90)    Past Surgical History:  Procedure Laterality Date  . NECK SURGERY  04/08/10   2 fusion and disctecomy/Dr. Wynetta Emery    reports that he has never smoked. He has never used smokeless tobacco. He reports current alcohol use. He reports that he does not use drugs. family history includes Diabetes in his father, mother, and sister; Heart failure in his mother; Hypertension in an other family member; Lung cancer in an other family member; Prostate cancer in his father; Sleep apnea in his  mother; Stroke in his mother. Allergies  Allergen Reactions  . Hydrocodone     REACTION: anxiety reaction  . Epinephrine Anxiety   Current Outpatient Medications on File Prior to Visit  Medication Sig Dispense Refill  . albuterol (VENTOLIN HFA) 108 (90 Base) MCG/ACT inhaler INHALE TWO PUFFS BY MOUTH FOUR TIMES DAILY AS NEEDED 18 g 11  . ALPRAZolam (XANAX) 0.5 MG tablet Take 1 tablet (0.5 mg total) by mouth 2 (two) times daily as needed. 60 tablet 5  . amLODipine (NORVASC) 2.5 MG tablet Take 1 tablet (2.5 mg total) by mouth daily. 90 tablet 3  . aspirin 81 MG tablet Take 81 mg by mouth daily.      Marland Kitchen atenolol (TENORMIN) 50 MG tablet Take 1 tablet (50 mg total) by mouth daily. 90 tablet 3  . atorvastatin (LIPITOR) 80 MG tablet Take 1 tablet (80 mg total) by mouth daily. 90 tablet 3  . Blood Glucose Monitoring Suppl (FREESTYLE LITE) DEVI Use as directed twice per day E11.9 1 each 0  . glipiZIDE (GLUCOTROL XL) 10 MG 24 hr tablet Take 1 tablet (10 mg total) by mouth daily with breakfast. Annual appt due in March must see provider for future refills 90 tablet 0  . glucose blood (FREESTYLE LITE) test strip Use to check blood sugar twice a day 200 each 5  . Lancets (FREESTYLE) lancets Use as directed twice per day E11.9 100 each 3  . lisinopril-hydrochlorothiazide (ZESTORETIC)  20-12.5 MG tablet Take 2 tablets by mouth daily. 180 tablet 3  . loratadine (CLARITIN) 10 MG tablet Take 10 mg by mouth as needed.      . metFORMIN (GLUCOPHAGE) 500 MG tablet Take 2 tablets (1,000 mg total) by mouth 2 (two) times daily with a meal. 360 tablet 3  . niacin 500 MG tablet Take 3 tablets (1,500 mg total) by mouth daily with breakfast. 270 tablet 3  . omeprazole (PRILOSEC) 20 MG capsule Take 1 capsule (20 mg total) by mouth daily. 90 capsule 0  . sitaGLIPtin (JANUVIA) 100 MG tablet Take 1 tablet (100 mg total) by mouth daily. 90 tablet 3   No current facility-administered medications on file prior to visit.    Review of Systems All otherwise neg per pt     Objective:   Physical Exam BP 118/74   Pulse 66   Temp 98.4 F (36.9 C)   Ht 6' (1.829 m)   Wt 290 lb (131.5 kg)   SpO2 99%   BMI 39.33 kg/m  VS noted,  Constitutional: Pt appears in NAD HENT: Head: NCAT.  Right Ear: External ear normal.  Left Ear: External ear normal.  Eyes: . Pupils are equal, round, and reactive to light. Conjunctivae and EOM are normal Nose: without d/c or deformity Neck: Neck supple. Gross normal ROM Cardiovascular: Normal rate and regular rhythm.   Pulmonary/Chest: Effort normal and breath sounds without rales or wheezing.  Abd:  Soft, NT, ND, + BS, no organomegaly Neurological: Pt is alert. At baseline orientation, motor grossly intact Skin: Skin is warm. No rashes, other new lesions, no LE edema Psychiatric: Pt behavior is normal without agitation  All otherwise neg per pt Lab Results  Component Value Date   WBC 11.3 (H) 07/28/2019   HGB 13.0 07/28/2019   HCT 40.0 07/28/2019   PLT 181.0 07/28/2019   GLUCOSE 120 (H) 07/28/2019   CHOL 164 07/28/2019   TRIG 292.0 (H) 07/28/2019   HDL 30.30 (L) 07/28/2019   LDLDIRECT 97.0 07/28/2019   LDLCALC 71 01/20/2017   ALT 20 07/28/2019   AST 16 07/28/2019   NA 138 07/28/2019   K 4.6 07/28/2019   CL 103 07/28/2019   CREATININE 1.25 07/28/2019   BUN 30 (H) 07/28/2019   CO2 27 07/28/2019   TSH 2.90 07/28/2019   PSA 1.22 07/28/2019   HGBA1C 6.3 07/28/2019   MICROALBUR 5.0 (H) 07/28/2019      Assessment & Plan:

## 2019-08-07 ENCOUNTER — Encounter: Payer: Self-pay | Admitting: Internal Medicine

## 2019-08-07 NOTE — Assessment & Plan Note (Signed)
For replacement 

## 2019-08-07 NOTE — Assessment & Plan Note (Signed)
stable overall by history and exam, recent data reviewed with pt, and pt to continue medical treatment as before,  to f/u any worsening symptoms or concerns  

## 2019-08-07 NOTE — Assessment & Plan Note (Signed)

## 2019-09-05 ENCOUNTER — Other Ambulatory Visit: Payer: Self-pay | Admitting: Internal Medicine

## 2019-09-05 NOTE — Telephone Encounter (Signed)
Done erx 

## 2019-09-13 ENCOUNTER — Other Ambulatory Visit: Payer: Self-pay | Admitting: Internal Medicine

## 2019-09-13 NOTE — Telephone Encounter (Signed)
Please refill as per office routine med refill policy (all routine meds refilled for 3 mo or monthly per pt preference up to one year from last visit, then month to month grace period for 3 mo, then further med refills will have to be denied)  

## 2019-10-02 ENCOUNTER — Other Ambulatory Visit: Payer: Self-pay | Admitting: Internal Medicine

## 2019-10-20 ENCOUNTER — Other Ambulatory Visit: Payer: Self-pay | Admitting: Internal Medicine

## 2019-10-20 NOTE — Telephone Encounter (Signed)
Please let pt know to change to OTC Vitamin D3 at 2000 units per day, indefinitely.  

## 2019-10-23 ENCOUNTER — Other Ambulatory Visit: Payer: Self-pay | Admitting: Internal Medicine

## 2019-10-23 NOTE — Telephone Encounter (Signed)
Please refill as per office routine med refill policy (all routine meds refilled for 3 mo or monthly per pt preference up to one year from last visit, then month to month grace period for 3 mo, then further med refills will have to be denied)  

## 2019-12-09 ENCOUNTER — Other Ambulatory Visit: Payer: Self-pay | Admitting: Internal Medicine

## 2019-12-09 NOTE — Telephone Encounter (Signed)
Please refill as per office routine med refill policy (all routine meds refilled for 3 mo or monthly per pt preference up to one year from last visit, then month to month grace period for 3 mo, then further med refills will have to be denied)  

## 2019-12-29 ENCOUNTER — Other Ambulatory Visit: Payer: Self-pay | Admitting: Internal Medicine

## 2019-12-29 NOTE — Telephone Encounter (Signed)
Please refill as per office routine med refill policy (all routine meds refilled for 3 mo or monthly per pt preference up to one year from last visit, then month to month grace period for 3 mo, then further med refills will have to be denied)  

## 2020-01-25 ENCOUNTER — Other Ambulatory Visit: Payer: Self-pay | Admitting: Internal Medicine

## 2020-01-25 NOTE — Telephone Encounter (Signed)
Please refill as per office routine med refill policy (all routine meds refilled for 3 mo or monthly per pt preference up to one year from last visit, then month to month grace period for 3 mo, then further med refills will have to be denied)  

## 2020-02-01 ENCOUNTER — Other Ambulatory Visit (INDEPENDENT_AMBULATORY_CARE_PROVIDER_SITE_OTHER): Payer: BC Managed Care – PPO

## 2020-02-01 DIAGNOSIS — E119 Type 2 diabetes mellitus without complications: Secondary | ICD-10-CM

## 2020-02-01 DIAGNOSIS — E559 Vitamin D deficiency, unspecified: Secondary | ICD-10-CM | POA: Diagnosis not present

## 2020-02-01 DIAGNOSIS — E538 Deficiency of other specified B group vitamins: Secondary | ICD-10-CM

## 2020-02-01 LAB — HEMOGLOBIN A1C: Hgb A1c MFr Bld: 6.7 % — ABNORMAL HIGH (ref 4.6–6.5)

## 2020-02-01 LAB — BASIC METABOLIC PANEL
BUN: 33 mg/dL — ABNORMAL HIGH (ref 6–23)
CO2: 27 mEq/L (ref 19–32)
Calcium: 9.6 mg/dL (ref 8.4–10.5)
Chloride: 102 mEq/L (ref 96–112)
Creatinine, Ser: 1.31 mg/dL (ref 0.40–1.50)
GFR: 55.34 mL/min — ABNORMAL LOW (ref >60.00)
Glucose, Bld: 159 mg/dL — ABNORMAL HIGH (ref 70–99)
Potassium: 4.7 mEq/L (ref 3.5–5.1)
Sodium: 139 mEq/L (ref 135–145)

## 2020-02-01 LAB — HEPATIC FUNCTION PANEL
ALT: 16 U/L (ref 0–53)
AST: 14 U/L (ref 0–37)
Albumin: 4.4 g/dL (ref 3.5–5.2)
Alkaline Phosphatase: 86 U/L (ref 39–117)
Bilirubin, Direct: 0.1 mg/dL (ref 0.0–0.3)
Total Bilirubin: 0.6 mg/dL (ref 0.2–1.2)
Total Protein: 7.8 g/dL (ref 6.0–8.3)

## 2020-02-01 LAB — LIPID PANEL
Cholesterol: 154 mg/dL (ref 0–200)
HDL: 30.3 mg/dL — ABNORMAL LOW (ref >39.00)
NonHDL: 123.25
Total CHOL/HDL Ratio: 5
Triglycerides: 211 mg/dL — ABNORMAL HIGH (ref 0.0–149.0)
VLDL: 42.2 mg/dL — ABNORMAL HIGH (ref 0.0–40.0)

## 2020-02-01 LAB — VITAMIN B12: Vitamin B-12: 762 pg/mL (ref 211–911)

## 2020-02-01 LAB — VITAMIN D 25 HYDROXY (VIT D DEFICIENCY, FRACTURES): VITD: 31.79 ng/mL (ref 30.00–100.00)

## 2020-02-01 LAB — LDL CHOLESTEROL, DIRECT: Direct LDL: 90 mg/dL

## 2020-02-08 ENCOUNTER — Other Ambulatory Visit: Payer: Self-pay | Admitting: Internal Medicine

## 2020-02-08 ENCOUNTER — Encounter: Payer: Self-pay | Admitting: Internal Medicine

## 2020-02-08 ENCOUNTER — Other Ambulatory Visit: Payer: Self-pay

## 2020-02-08 ENCOUNTER — Ambulatory Visit (INDEPENDENT_AMBULATORY_CARE_PROVIDER_SITE_OTHER): Payer: BC Managed Care – PPO | Admitting: Internal Medicine

## 2020-02-08 VITALS — BP 132/78 | HR 67 | Temp 98.9°F | Ht 72.0 in | Wt 296.8 lb

## 2020-02-08 DIAGNOSIS — Z23 Encounter for immunization: Secondary | ICD-10-CM

## 2020-02-08 DIAGNOSIS — Z1211 Encounter for screening for malignant neoplasm of colon: Secondary | ICD-10-CM

## 2020-02-08 DIAGNOSIS — E559 Vitamin D deficiency, unspecified: Secondary | ICD-10-CM

## 2020-02-08 DIAGNOSIS — E119 Type 2 diabetes mellitus without complications: Secondary | ICD-10-CM | POA: Diagnosis not present

## 2020-02-08 DIAGNOSIS — Z0000 Encounter for general adult medical examination without abnormal findings: Secondary | ICD-10-CM

## 2020-02-08 DIAGNOSIS — I10 Essential (primary) hypertension: Secondary | ICD-10-CM

## 2020-02-08 DIAGNOSIS — E785 Hyperlipidemia, unspecified: Secondary | ICD-10-CM | POA: Diagnosis not present

## 2020-02-08 DIAGNOSIS — E538 Deficiency of other specified B group vitamins: Secondary | ICD-10-CM | POA: Diagnosis not present

## 2020-02-08 DIAGNOSIS — N1831 Chronic kidney disease, stage 3a: Secondary | ICD-10-CM

## 2020-02-08 MED ORDER — FREESTYLE LITE TEST VI STRP: ORAL_STRIP | 5 refills | Status: AC

## 2020-02-08 NOTE — Patient Instructions (Addendum)
Please remember to have your covid booster shot done by signing up at [facility] or [facility] or [email or web address]  Please take OTC Vitamin D3 at 2000 units per day, indefinitely.  Please check with your insurance about the Shingles shot and Cologuard coverage and let us know  Please continue all other medications as before, and refills have been done if requested.  Please have the pharmacy call with any other refills you may need.  Please continue your efforts at being more active, low cholesterol diet, and weight control.  You are otherwise up to date with prevention measures today.  Please keep your appointments with your specialists as you may have planned  Please make an Appointment to return in 6 months, or sooner if needed, also with Lab Appointment for testing done 3-5 days before at the FIRST FLOOR Lab (so this is for TWO appointments - please see the scheduling desk as you leave)

## 2020-02-08 NOTE — Telephone Encounter (Signed)
Please refill as per office routine med refill policy (all routine meds refilled for 3 mo or monthly per pt preference up to one year from last visit, then month to month grace period for 3 mo, then further med refills will have to be denied)  

## 2020-02-08 NOTE — Progress Notes (Addendum)
Subjective:    Patient ID: Joseph Maxwell, male    DOB: [birth year], 64 y.o.   MRN: [id number]  HPI   Here to f/u; overall doing ok,  Pt denies chest pain, increasing sob or doe, wheezing, orthopnea, PND, increased LE swelling, palpitations, dizziness or syncope.  Pt denies new neurological symptoms such as new headache, or facial or extremity weakness or numbness.  Pt denies polydipsia, polyuria, or low sugar episode.  Pt states overall good compliance with meds, mostly trying to follow appropriate diet, with wt overall stable,  but little exercise however. Plans to see eye doctor soon, last seen sept 2020.  Received a letter from [facility]  - due for colonoscopy but plans to check with insurance about coverage for cologuard instead and let us know, also to check on shingles shot coverage.  Taking b12 oral, but not vit d Past Medical History:  Diagnosis Date  . ALLERGIC RHINITIS   . BACK PAIN   . Cervical disc disease   . DIABETES MELLITUS   . DIVERTICULOSIS, COLON   . ERECTILE DYSFUNCTION   . FAMILIAL TREMOR   . GERD   . HYPERLIPIDEMIA   . HYPERTENSION   . OBSTRUCTIVE SLEEP APNEA   . ONYCHOMYCOSIS, TOENAILS   . PLANTAR FASCIITIS, LEFT   . Unspecified asthma(493.90)    Past Surgical History:  Procedure Laterality Date  . NECK SURGERY  04/08/10   2 fusion and disctecomy/Dr. Wynetta Emery    reports that he has never smoked. He has never used smokeless tobacco. He reports current alcohol use. He reports that he does not use drugs. family history includes Diabetes in his father, mother, and sister; Heart failure in his mother; Hypertension in an other family member; Lung cancer in an other family member; Prostate cancer in his father; Sleep apnea in his mother; Stroke in his mother. Allergies  Allergen Reactions  . Hydrocodone     REACTION: anxiety reaction  . Epinephrine Anxiety   Current Outpatient Medications on File Prior to Visit  Medication Sig Dispense Refill  . albuterol (VENTOLIN  HFA) 108 (90 Base) MCG/ACT inhaler INHALE TWO PUFFS BY MOUTH FOUR TIMES DAILY AS NEEDED 8.5 g 2  . ALPRAZolam (XANAX) 0.5 MG tablet TAKE 1 TABLET (0.5 MG TOTAL) BY MOUTH 2 (TWO) TIMES DAILY AS NEEDED. 60 tablet 5  . aspirin 81 MG tablet Take 81 mg by mouth daily.      Marland Kitchen atenolol (TENORMIN) 50 MG tablet TAKE 1 TABLET BY MOUTH EVERY DAY 90 tablet 3  . atorvastatin (LIPITOR) 80 MG tablet Take 1 tablet (80 mg total) by mouth daily. 90 tablet 3  . Blood Glucose Monitoring Suppl (FREESTYLE LITE) DEVI Use as directed twice per day E11.9 1 each 0  . glipiZIDE (GLUCOTROL XL) 10 MG 24 hr tablet Take 1 tablet (10 mg total) by mouth daily with breakfast. Follow-up appt due in Sept must see provider for future refills 90 tablet 0  . Lancets (FREESTYLE) lancets Use as directed twice per day E11.9 100 each 3  . loratadine (CLARITIN) 10 MG tablet Take 10 mg by mouth as needed.      . metFORMIN (GLUCOPHAGE) 500 MG tablet Take 2 tablets (1,000 mg total) by mouth 2 (two) times daily with a meal. 360 tablet 3  . niacin 500 MG tablet Take 3 tablets (1,500 mg total) by mouth daily with breakfast. 270 tablet 3  . omeprazole (PRILOSEC) 20 MG capsule TAKE ONE CAPSULE BY MOUTH DAILY 90 capsule 2  .  sitaGLIPtin (JANUVIA) 100 MG tablet Take 1 tablet (100 mg total) by mouth daily. 90 tablet 3  . vitamin B-12 (CYANOCOBALAMIN) 1000 MCG tablet Take 1 tablet (1,000 mcg total) by mouth daily. 90 tablet 3  . Vitamin D, Ergocalciferol, (DRISDOL) 1.25 MG (50000 UNIT) CAPS capsule Take 1 capsule (50,000 Units total) by mouth every 7 (seven) days. 12 capsule 0   No current facility-administered medications on file prior to visit.   Review of Systems All otherwise neg per pt     Objective:   Physical Exam BP 132/78 (BP Location: Left Arm, Patient Position: Sitting, Cuff Size: Large)   Pulse 67   Temp 98.9 F (37.2 C) (Oral)   Ht 6' (1.829 m)   Wt 296 lb 12.8 oz (134.6 kg)   SpO2 94%   BMI 40.25 kg/m  VS noted,   Constitutional: Pt appears in NAD HENT: Head: NCAT.  Right Ear: External ear normal.  Left Ear: External ear normal.  Eyes: . Pupils are equal, round, and reactive to light. Conjunctivae and EOM are normal Nose: without d/c or deformity Neck: Neck supple. Gross normal ROM Cardiovascular: Normal rate and regular rhythm.   Pulmonary/Chest: Effort normal and breath sounds without rales or wheezing.  Abd:  Soft, NT, ND, + BS, no organomegaly Neurological: Pt is alert. At baseline orientation, motor grossly intact Skin: Skin is warm. No rashes, other new lesions, no LE edema Psychiatric: Pt behavior is normal without agitation  All otherwise neg per pt Lab Results  Component Value Date   WBC 11.3 (H) 07/28/2019   HGB 13.0 07/28/2019   HCT 40.0 07/28/2019   PLT 181.0 07/28/2019   GLUCOSE 159 (H) 02/01/2020   CHOL 154 02/01/2020   TRIG 211.0 (H) 02/01/2020   HDL 30.30 (L) 02/01/2020   LDLDIRECT 90.0 02/01/2020   LDLCALC 71 01/20/2017   ALT 16 02/01/2020   AST 14 02/01/2020   NA 139 02/01/2020   K 4.7 02/01/2020   CL 102 02/01/2020   CREATININE 1.31 02/01/2020   BUN 33 (H) 02/01/2020   CO2 27 02/01/2020   TSH 2.90 07/28/2019   PSA 1.22 07/28/2019   HGBA1C 6.7 (H) 02/01/2020   MICROALBUR 5.0 (H) 07/28/2019      Assessment & Plan:

## 2020-02-10 ENCOUNTER — Encounter: Payer: Self-pay | Admitting: Internal Medicine

## 2020-02-12 ENCOUNTER — Encounter: Payer: Self-pay | Admitting: Internal Medicine

## 2020-02-12 DIAGNOSIS — N189 Chronic kidney disease, unspecified: Secondary | ICD-10-CM | POA: Insufficient documentation

## 2020-02-12 NOTE — Assessment & Plan Note (Signed)
stable overall by history and exam, recent data reviewed with pt, and pt to continue medical treatment as before,  to f/u any worsening symptoms or concerns  

## 2020-02-12 NOTE — Assessment & Plan Note (Addendum)
stable overall by history and exam, recent data reviewed with pt, and pt to continue medical treatment as before,  to f/u any worsening symptoms or concerns  I spent 31 minutes in preparing to see the patient by review of recent labs, imaging and procedures, obtaining and reviewing separately obtained history, communicating with the patient and family or caregiver, ordering medications, tests or procedures, and documenting clinical information in the EHR including the differential Dx, treatment, and any further evaluation and other management of dm, htn, hld, b12 and vit d deficiency

## 2020-02-12 NOTE — Addendum Note (Signed)
Addended by: Corwin Levins on: 02/12/2020 04:12 PM   Modules accepted: Orders

## 2020-02-12 NOTE — Assessment & Plan Note (Signed)
To start oral 2000 qd

## 2020-02-13 NOTE — Telephone Encounter (Signed)
Maria to sign up pt for cologuard please

## 2020-02-16 NOTE — Addendum Note (Signed)
Addended by: Delsa Grana R on: 02/16/2020 10:06 AM   Modules accepted: Orders

## 2020-02-22 DIAGNOSIS — Z1211 Encounter for screening for malignant neoplasm of colon: Secondary | ICD-10-CM | POA: Diagnosis not present

## 2020-02-22 LAB — COLOGUARD: Cologuard: NEGATIVE

## 2020-02-29 ENCOUNTER — Other Ambulatory Visit: Payer: Self-pay | Admitting: Internal Medicine

## 2020-02-29 NOTE — Telephone Encounter (Signed)
Please refill as per office routine med refill policy (all routine meds refilled for 3 mo or monthly per pt preference up to one year from last visit, then month to month grace period for 3 mo, then further med refills will have to be denied)  

## 2020-03-03 LAB — EXTERNAL GENERIC LAB PROCEDURE: COLOGUARD: NEGATIVE

## 2020-03-03 LAB — COLOGUARD: COLOGUARD: NEGATIVE

## 2020-03-25 ENCOUNTER — Other Ambulatory Visit: Payer: Self-pay | Admitting: Internal Medicine

## 2020-03-25 NOTE — Telephone Encounter (Signed)
Please refill as per office routine med refill policy (all routine meds refilled for 3 mo or monthly per pt preference up to one year from last visit, then month to month grace period for 3 mo, then further med refills will have to be denied)  

## 2020-03-28 ENCOUNTER — Other Ambulatory Visit: Payer: Self-pay | Admitting: Internal Medicine

## 2020-03-28 NOTE — Telephone Encounter (Signed)
Please refill as per office routine med refill policy (all routine meds refilled for 3 mo or monthly per pt preference up to one year from last visit, then month to month grace period for 3 mo, then further med refills will have to be denied)  

## 2020-05-14 ENCOUNTER — Other Ambulatory Visit: Payer: Self-pay | Admitting: Internal Medicine

## 2020-05-14 NOTE — Telephone Encounter (Signed)
Please refill as per office routine med refill policy (all routine meds refilled for 3 mo or monthly per pt preference up to one year from last visit, then month to month grace period for 3 mo, then further med refills will have to be denied)  

## 2020-05-17 ENCOUNTER — Telehealth (INDEPENDENT_AMBULATORY_CARE_PROVIDER_SITE_OTHER): Payer: BC Managed Care – PPO | Admitting: Internal Medicine

## 2020-05-17 ENCOUNTER — Other Ambulatory Visit: Payer: Self-pay

## 2020-05-17 ENCOUNTER — Encounter: Payer: Self-pay | Admitting: Internal Medicine

## 2020-05-17 DIAGNOSIS — E119 Type 2 diabetes mellitus without complications: Secondary | ICD-10-CM

## 2020-05-17 DIAGNOSIS — I10 Essential (primary) hypertension: Secondary | ICD-10-CM | POA: Diagnosis not present

## 2020-05-17 DIAGNOSIS — J069 Acute upper respiratory infection, unspecified: Secondary | ICD-10-CM

## 2020-05-17 MED ORDER — BENZONATATE 100 MG PO CAPS: ORAL_CAPSULE | ORAL | 0 refills | Status: DC

## 2020-05-17 NOTE — Progress Notes (Signed)
Patient ID: Joseph Maxwell, male   DOB: 12-Jun-1955, 65 y.o.   MRN: [id number]  Virtual Visit via Video Note  I connected with Joseph Maxwell on 05/17/20 at 10:20 AM EST by a video enabled telemedicine application and verified that I am speaking with the correct person using two identifiers.  Location of all particpants today Patient: at home Provider: at office   I discussed the limitations of evaluation and management by telemedicine and the availability of in person appointments. The patient expressed understanding and agreed to proceed.  History of Present Illness:  Here with 2-3 days acute onset fever, facial pain, pressure, headache, general weakness and malaise, and clearish d/c, with mild ST and increaseing cough, but pt denies chest pain, wheezing, increased sob or doe, orthopnea, PND, increased LE swelling, palpitations, dizziness or syncope.  Has been using dayquil and nyquil, but asks for tesssalon perles for cough control.  Has not yet been tested for covid.  Pt denies new neurological symptoms such as new headache, or facial or extremity weakness or numbness   Pt denies polydipsia, polyuria, No other new complaints Past Medical History:  Diagnosis Date  . ALLERGIC RHINITIS   . BACK PAIN   . Cervical disc disease   . DIABETES MELLITUS   . DIVERTICULOSIS, COLON   . ERECTILE DYSFUNCTION   . FAMILIAL TREMOR   . GERD   . HYPERLIPIDEMIA   . HYPERTENSION   . OBSTRUCTIVE SLEEP APNEA   . ONYCHOMYCOSIS, TOENAILS   . PLANTAR FASCIITIS, LEFT   . Unspecified asthma(493.90)    Past Surgical History:  Procedure Laterality Date  . NECK SURGERY  04/08/10   2 fusion and disctecomy/Dr. Wynetta Emery    reports that he has never smoked. He has never used smokeless tobacco. He reports current alcohol use. He reports that he does not use drugs. family history includes Diabetes in his father, mother, and sister; Heart failure in his mother; Hypertension in an other family member; Lung cancer in an  other family member; Prostate cancer in his father; Sleep apnea in his mother; Stroke in his mother. Allergies  Allergen Reactions  . Hydrocodone     REACTION: anxiety reaction  . Epinephrine Anxiety   Current Outpatient Medications on File Prior to Visit  Medication Sig Dispense Refill  . albuterol (VENTOLIN HFA) 108 (90 Base) MCG/ACT inhaler INHALE TWO PUFFS BY MOUTH FOUR TIMES DAILY AS NEEDED 8.5 g 2  . ALPRAZolam (XANAX) 0.5 MG tablet TAKE 1 TABLET (0.5 MG TOTAL) BY MOUTH 2 (TWO) TIMES DAILY AS NEEDED. 60 tablet 5  . amLODipine (NORVASC) 2.5 MG tablet TAKE 1 TABLET BY MOUTH EVERY DAY 90 tablet 3  . aspirin 81 MG tablet Take 81 mg by mouth daily.      Marland Kitchen atenolol (TENORMIN) 50 MG tablet TAKE 1 TABLET BY MOUTH EVERY DAY 90 tablet 3  . atorvastatin (LIPITOR) 80 MG tablet TAKE 1 TABLET BY MOUTH EVERY DAY 90 tablet 3  . Blood Glucose Monitoring Suppl (FREESTYLE LITE) DEVI Use as directed twice per day E11.9 1 each 0  . glipiZIDE (GLUCOTROL XL) 10 MG 24 hr tablet TAKE 1 TABLET BY MOUTH EVERY DAY WITH BREAKFAST 90 tablet 0  . glucose blood (FREESTYLE LITE) test strip Use to check blood sugar twice a day 200 each 5  . JANUVIA 100 MG tablet TAKE 1 TABLET BY MOUTH EVERY DAY 90 tablet 1  . Lancets (FREESTYLE) lancets Use as directed twice per day E11.9 100 each 3  . lisinopril-hydrochlorothiazide (  ZESTORETIC) 20-12.5 MG tablet TAKE 2 TABLETS BY MOUTH EVERY DAY 180 tablet 3  . loratadine (CLARITIN) 10 MG tablet Take 10 mg by mouth as needed.      . metFORMIN (GLUCOPHAGE) 500 MG tablet TAKE 2 TABLETS BY MOUTH 2 (TWO) TIMES DAILY WITH A MEAL. 360 tablet 3  . niacin 500 MG tablet Take 3 tablets (1,500 mg total) by mouth daily with breakfast. 270 tablet 3  . omeprazole (PRILOSEC) 20 MG capsule TAKE ONE CAPSULE BY MOUTH DAILY 90 capsule 2  . vitamin B-12 (CYANOCOBALAMIN) 1000 MCG tablet Take 1 tablet (1,000 mcg total) by mouth daily. 90 tablet 3  . Vitamin D, Ergocalciferol, (DRISDOL) 1.25 MG (50000  UNIT) CAPS capsule Take 1 capsule (50,000 Units total) by mouth every 7 (seven) days. 12 capsule 0   No current facility-administered medications on file prior to visit.    Observations/Objective: Alert, NAD, appropriate mood and affect, resps normal, cn 2-12 intact, moves all 4s, no visible rash or swelling Lab Results  Component Value Date   WBC 11.3 (H) 07/28/2019   HGB 13.0 07/28/2019   HCT 40.0 07/28/2019   PLT 181.0 07/28/2019   GLUCOSE 159 (H) 02/01/2020   CHOL 154 02/01/2020   TRIG 211.0 (H) 02/01/2020   HDL 30.30 (L) 02/01/2020   LDLDIRECT 90.0 02/01/2020   LDLCALC 71 01/20/2017   ALT 16 02/01/2020   AST 14 02/01/2020   NA 139 02/01/2020   K 4.7 02/01/2020   CL 102 02/01/2020   CREATININE 1.31 02/01/2020   BUN 33 (H) 02/01/2020   CO2 27 02/01/2020   TSH 2.90 07/28/2019   PSA 1.22 07/28/2019   HGBA1C 6.7 (H) 02/01/2020   MICROALBUR 5.0 (H) 07/28/2019   Assessment and Plan: See notes  Follow Up Instructions: See notes   I discussed the assessment and treatment plan with the patient. The patient was provided an opportunity to ask questions and all were answered. The patient agreed with the plan and demonstrated an understanding of the instructions.   The patient was advised to call back or seek an in-person evaluation if the symptoms worsen or if the condition fails to improve as anticipated.   Oliver Barre, MD

## 2020-05-19 ENCOUNTER — Other Ambulatory Visit: Payer: BC Managed Care – PPO

## 2020-05-19 DIAGNOSIS — Z20822 Contact with and (suspected) exposure to covid-19: Secondary | ICD-10-CM

## 2020-05-22 LAB — NOVEL CORONAVIRUS, NAA: SARS-CoV-2, NAA: NOT DETECTED

## 2020-05-28 ENCOUNTER — Encounter: Payer: Self-pay | Admitting: Internal Medicine

## 2020-05-28 DIAGNOSIS — J069 Acute upper respiratory infection, unspecified: Secondary | ICD-10-CM | POA: Insufficient documentation

## 2020-05-28 NOTE — Assessment & Plan Note (Signed)
Encourage pt to continue to monitor BP at home with goal to be at least less than 140/90

## 2020-05-28 NOTE — Assessment & Plan Note (Signed)
Lab Results  Component Value Date   HGBA1C 6.7 (H) 02/01/2020   Stable, pt to continue current medical treatment glucotrol, metformin, januvia  Current Outpatient Medications (Endocrine & Metabolic):  .  glipiZIDE (GLUCOTROL XL) 10 MG 24 hr tablet, TAKE 1 TABLET BY MOUTH EVERY DAY WITH BREAKFAST .  JANUVIA 100 MG tablet, TAKE 1 TABLET BY MOUTH EVERY DAY .  metFORMIN (GLUCOPHAGE) 500 MG tablet, TAKE 2 TABLETS BY MOUTH 2 (TWO) TIMES DAILY WITH A MEAL.  Current Outpatient Medications (Cardiovascular):  .  amLODipine (NORVASC) 2.5 MG tablet, TAKE 1 TABLET BY MOUTH EVERY DAY .  atenolol (TENORMIN) 50 MG tablet, TAKE 1 TABLET BY MOUTH EVERY DAY .  atorvastatin (LIPITOR) 80 MG tablet, TAKE 1 TABLET BY MOUTH EVERY DAY .  lisinopril-hydrochlorothiazide (ZESTORETIC) 20-12.5 MG tablet, TAKE 2 TABLETS BY MOUTH EVERY DAY  Current Outpatient Medications (Respiratory):  .  benzonatate (TESSALON PERLES) 100 MG capsule, 1-2 tab by mouth three times per day as needed for cough .  albuterol (VENTOLIN HFA) 108 (90 Base) MCG/ACT inhaler, INHALE TWO PUFFS BY MOUTH FOUR TIMES DAILY AS NEEDED .  loratadine (CLARITIN) 10 MG tablet, Take 10 mg by mouth as needed.    Current Outpatient Medications (Analgesics):  .  aspirin 81 MG tablet, Take 81 mg by mouth daily.    Current Outpatient Medications (Hematological):  .  vitamin B-12 (CYANOCOBALAMIN) 1000 MCG tablet, Take 1 tablet (1,000 mcg total) by mouth daily.  Current Outpatient Medications (Other):  Marland Kitchen  ALPRAZolam (XANAX) 0.5 MG tablet, TAKE 1 TABLET (0.5 MG TOTAL) BY MOUTH 2 (TWO) TIMES DAILY AS NEEDED. Marland Kitchen  Blood Glucose Monitoring Suppl (FREESTYLE LITE) DEVI, Use as directed twice per day E11.9 .  glucose blood (FREESTYLE LITE) test strip, Use to check blood sugar twice a day .  Lancets (FREESTYLE) lancets, Use as directed twice per day E11.9 .  niacin 500 MG tablet, Take 3 tablets (1,500 mg total) by mouth daily with breakfast. .  omeprazole (PRILOSEC) 20  MG capsule, TAKE ONE CAPSULE BY MOUTH DAILY .  Vitamin D, Ergocalciferol, (DRISDOL) 1.25 MG (50000 UNIT) CAPS capsule, Take 1 capsule (50,000 Units total) by mouth every 7 (seven) days.

## 2020-05-28 NOTE — Patient Instructions (Signed)
Please take all new medication as prescribed  Please get covid tested

## 2020-05-28 NOTE — Assessment & Plan Note (Signed)
Mild to mod, cw likely viral illness, encouraged for covid testing, also for tessalon perles prn cough,  to f/u any worsening symptoms or concerns

## 2020-05-30 ENCOUNTER — Other Ambulatory Visit: Payer: Self-pay | Admitting: Internal Medicine

## 2020-07-10 ENCOUNTER — Other Ambulatory Visit: Payer: Self-pay | Admitting: Internal Medicine

## 2020-07-10 NOTE — Telephone Encounter (Signed)
Please refill as per office routine med refill policy (all routine meds refilled for 3 mo or monthly per pt preference up to one year from last visit, then month to month grace period for 3 mo, then further med refills will have to be denied)  

## 2020-07-11 ENCOUNTER — Other Ambulatory Visit: Payer: Self-pay | Admitting: Internal Medicine

## 2020-07-25 ENCOUNTER — Other Ambulatory Visit: Payer: Self-pay | Admitting: Internal Medicine

## 2020-07-26 NOTE — Telephone Encounter (Signed)
Please refill as per office routine med refill policy (all routine meds refilled for 3 mo or monthly per pt preference up to one year from last visit, then month to month grace period for 3 mo, then further med refills will have to be denied)  

## 2020-07-31 ENCOUNTER — Encounter: Payer: Self-pay | Admitting: Internal Medicine

## 2020-08-01 ENCOUNTER — Other Ambulatory Visit (INDEPENDENT_AMBULATORY_CARE_PROVIDER_SITE_OTHER): Payer: BC Managed Care – PPO

## 2020-08-01 DIAGNOSIS — Z0000 Encounter for general adult medical examination without abnormal findings: Secondary | ICD-10-CM

## 2020-08-01 DIAGNOSIS — Z125 Encounter for screening for malignant neoplasm of prostate: Secondary | ICD-10-CM

## 2020-08-01 DIAGNOSIS — E538 Deficiency of other specified B group vitamins: Secondary | ICD-10-CM

## 2020-08-01 DIAGNOSIS — E119 Type 2 diabetes mellitus without complications: Secondary | ICD-10-CM | POA: Diagnosis not present

## 2020-08-01 DIAGNOSIS — E559 Vitamin D deficiency, unspecified: Secondary | ICD-10-CM

## 2020-08-01 DIAGNOSIS — N1831 Chronic kidney disease, stage 3a: Secondary | ICD-10-CM | POA: Diagnosis not present

## 2020-08-01 LAB — COMPREHENSIVE METABOLIC PANEL
ALT: 14 U/L (ref 0–53)
AST: 12 U/L (ref 0–37)
Albumin: 4.4 g/dL (ref 3.5–5.2)
Alkaline Phosphatase: 87 U/L (ref 39–117)
BUN: 29 mg/dL — ABNORMAL HIGH (ref 6–23)
CO2: 26 mEq/L (ref 19–32)
Calcium: 9.4 mg/dL (ref 8.4–10.5)
Chloride: 102 mEq/L (ref 96–112)
Creatinine, Ser: 1.31 mg/dL (ref 0.40–1.50)
GFR: 58.1 mL/min — ABNORMAL LOW (ref >60.00)
Glucose, Bld: 128 mg/dL — ABNORMAL HIGH (ref 70–99)
Potassium: 4.4 mEq/L (ref 3.5–5.1)
Sodium: 141 mEq/L (ref 135–145)
Total Bilirubin: 0.5 mg/dL (ref 0.2–1.2)
Total Protein: 7.3 g/dL (ref 6.0–8.3)

## 2020-08-01 LAB — CBC WITH DIFFERENTIAL/PLATELET
Basophils Absolute: 0.1 10*3/uL (ref 0.0–0.1)
Basophils Relative: 0.8 % (ref 0.0–3.0)
Eosinophils Absolute: 0.3 10*3/uL (ref 0.0–0.7)
Eosinophils Relative: 2.3 % (ref 0.0–5.0)
HCT: 38.8 % — ABNORMAL LOW (ref 39.0–52.0)
Hemoglobin: 12.8 g/dL — ABNORMAL LOW (ref 13.0–17.0)
Lymphocytes Relative: 17.1 % (ref 12.0–46.0)
Lymphs Abs: 2 10*3/uL (ref 0.7–4.0)
MCHC: 32.9 g/dL (ref 30.0–36.0)
MCV: 79.9 fl (ref 78.0–100.0)
Monocytes Absolute: 1 10*3/uL (ref 0.1–1.0)
Monocytes Relative: 8.4 % (ref 3.0–12.0)
Neutro Abs: 8.2 10*3/uL — ABNORMAL HIGH (ref 1.4–7.7)
Neutrophils Relative %: 71.4 % (ref 43.0–77.0)
Platelets: 200 10*3/uL (ref 150.0–400.0)
RBC: 4.86 Mil/uL (ref 4.22–5.81)
RDW: 15.6 % — ABNORMAL HIGH (ref 11.5–15.5)
WBC: 11.5 10*3/uL — ABNORMAL HIGH (ref 4.0–10.5)

## 2020-08-01 LAB — PSA: PSA: 1.24 ng/mL (ref 0.10–4.00)

## 2020-08-01 LAB — PHOSPHORUS: Phosphorus: 4 mg/dL (ref 2.3–4.6)

## 2020-08-01 LAB — LIPID PANEL
Cholesterol: 153 mg/dL (ref 0–200)
HDL: 29.8 mg/dL — ABNORMAL LOW (ref >39.00)
NonHDL: 122.91
Total CHOL/HDL Ratio: 5
Triglycerides: 207 mg/dL — ABNORMAL HIGH (ref 0.0–149.0)
VLDL: 41.4 mg/dL — ABNORMAL HIGH (ref 0.0–40.0)

## 2020-08-01 LAB — MICROALBUMIN / CREATININE URINE RATIO
Creatinine,U: 293.5 mg/dL
Microalb Creat Ratio: 1.3 mg/g (ref 0.0–30.0)
Microalb, Ur: 3.8 mg/dL — ABNORMAL HIGH (ref 0.0–1.9)

## 2020-08-01 LAB — URINALYSIS, ROUTINE W REFLEX MICROSCOPIC
Hgb urine dipstick: NEGATIVE
Ketones, ur: NEGATIVE
Leukocytes,Ua: NEGATIVE
Nitrite: NEGATIVE
RBC / HPF: NONE SEEN (ref 0–?)
Specific Gravity, Urine: >=1.03 — AB (ref 1.000–1.030)
Total Protein, Urine: NEGATIVE
Urine Glucose: NEGATIVE
Urobilinogen, UA: 1 (ref 0.0–1.0)
pH: 5.5 (ref 5.0–8.0)

## 2020-08-01 LAB — LDL CHOLESTEROL, DIRECT: Direct LDL: 85 mg/dL

## 2020-08-01 LAB — VITAMIN D 25 HYDROXY (VIT D DEFICIENCY, FRACTURES): VITD: 56.71 ng/mL (ref 30.00–100.00)

## 2020-08-01 LAB — TSH: TSH: 3.53 u[IU]/mL (ref 0.35–4.50)

## 2020-08-01 LAB — VITAMIN B12: Vitamin B-12: 973 pg/mL — ABNORMAL HIGH (ref 211–911)

## 2020-08-01 LAB — HEMOGLOBIN A1C: Hgb A1c MFr Bld: 6.5 % (ref 4.6–6.5)

## 2020-08-05 LAB — PTH, INTACT AND CALCIUM
Calcium: 9 mg/dL (ref 8.6–10.3)
PTH: 24 pg/mL (ref 16–77)

## 2020-08-07 ENCOUNTER — Other Ambulatory Visit: Payer: Self-pay

## 2020-08-08 ENCOUNTER — Encounter: Payer: Self-pay | Admitting: Internal Medicine

## 2020-08-08 ENCOUNTER — Other Ambulatory Visit: Payer: Self-pay

## 2020-08-08 ENCOUNTER — Ambulatory Visit: Payer: BC Managed Care – PPO | Admitting: Internal Medicine

## 2020-08-08 VITALS — BP 122/70 | HR 65 | Temp 98.5°F | Ht 72.0 in | Wt 291.0 lb

## 2020-08-08 DIAGNOSIS — Z0000 Encounter for general adult medical examination without abnormal findings: Secondary | ICD-10-CM

## 2020-08-08 DIAGNOSIS — E7800 Pure hypercholesterolemia, unspecified: Secondary | ICD-10-CM

## 2020-08-08 DIAGNOSIS — N1831 Chronic kidney disease, stage 3a: Secondary | ICD-10-CM

## 2020-08-08 DIAGNOSIS — E538 Deficiency of other specified B group vitamins: Secondary | ICD-10-CM

## 2020-08-08 DIAGNOSIS — E1165 Type 2 diabetes mellitus with hyperglycemia: Secondary | ICD-10-CM

## 2020-08-08 DIAGNOSIS — E559 Vitamin D deficiency, unspecified: Secondary | ICD-10-CM

## 2020-08-08 DIAGNOSIS — I10 Essential (primary) hypertension: Secondary | ICD-10-CM | POA: Diagnosis not present

## 2020-08-08 DIAGNOSIS — Z0001 Encounter for general adult medical examination with abnormal findings: Secondary | ICD-10-CM

## 2020-08-08 MED ORDER — EZETIMIBE 10 MG PO TABS: 10.0000 mg | ORAL_TABLET | Freq: Every day | ORAL | 3 refills | Status: DC

## 2020-08-08 NOTE — Progress Notes (Signed)
Patient ID: Joseph Maxwell, male   DOB: [birth year], 65 y.o.   MRN: [id number]         Chief Complaint:: wellness exam and Follow-up (/)  hld       HPI:  Joseph Maxwell is a 65 y.o. male here for wellness exam; due for eye exam and will call himself, ow up  To date with preventive referrals and immunizations                        Also trying to follow lower chol diet.  Pt denies chest pain, increased sob or doe, wheezing, orthopnea, PND, increased LE swelling, palpitations, dizziness or syncope. Denies new neuro s/s.   Pt denies polydipsia, polyuria,  Pt denies fever, wt loss, night sweats, loss of appetite, or other constitutional symptoms  No other new complaints      Wt Readings from Last 3 Encounters:  08/08/20 291 lb (132 kg)  02/08/20 296 lb 12.8 oz (134.6 kg)  08/04/19 290 lb (131.5 kg)   BP Readings from Last 3 Encounters:  08/08/20 122/70  02/08/20 132/78  08/04/19 118/74   Immunization History  Administered Date(s) Administered  . Influenza Whole 06/06/2009, 01/18/2010  . Influenza, Seasonal, Injecte, Preservative Fre 07/23/2012  . Influenza,inj,Quad PF,6+ Mos 01/25/2013, 01/31/2014, 01/30/2015, 01/31/2016, 01/30/2017, 02/02/2018, 02/04/2019, 02/08/2020  . PFIZER(Purple Top)SARS-COV-2 Vaccination 06/20/2019, 07/11/2019, 02/08/2020  . Pneumococcal Conjugate-13 07/31/2015  . Pneumococcal Polysaccharide-23 06/10/2010, 07/31/2016  . Td 09/06/2007  . Tdap 02/02/2018  . Zoster Recombinat (Shingrix) 02/08/2020   Health Maintenance Due  Topic Date Due  . OPHTHALMOLOGY EXAM  02/02/2020      Past Medical History:  Diagnosis Date  . ALLERGIC RHINITIS   . BACK PAIN   . Cervical disc disease   . DIABETES MELLITUS   . DIVERTICULOSIS, COLON   . ERECTILE DYSFUNCTION   . FAMILIAL TREMOR   . GERD   . HYPERLIPIDEMIA   . HYPERTENSION   . OBSTRUCTIVE SLEEP APNEA   . ONYCHOMYCOSIS, TOENAILS   . PLANTAR FASCIITIS, LEFT   . Unspecified asthma(493.90)    Past Surgical History:   Procedure Laterality Date  . NECK SURGERY  04/08/10   2 fusion and disctecomy/Dr. Wynetta Emery    reports that he has never smoked. He has never used smokeless tobacco. He reports current alcohol use. He reports that he does not use drugs. family history includes Diabetes in his father, mother, and sister; Heart failure in his mother; Hypertension in an other family member; Lung cancer in an other family member; Prostate cancer in his father; Sleep apnea in his mother; Stroke in his mother. Allergies  Allergen Reactions  . Hydrocodone     REACTION: anxiety reaction  . Epinephrine Anxiety   Current Outpatient Medications on File Prior to Visit  Medication Sig Dispense Refill  . albuterol (VENTOLIN HFA) 108 (90 Base) MCG/ACT inhaler INHALE TWO PUFFS BY MOUTH FOUR TIMES DAILY AS NEEDED 8.5 g 2  . ALPRAZolam (XANAX) 0.5 MG tablet TAKE 1 TABLET (0.5 MG TOTAL) BY MOUTH 2 (TWO) TIMES DAILY AS NEEDED. 60 tablet 5  . amLODipine (NORVASC) 2.5 MG tablet TAKE 1 TABLET BY MOUTH EVERY DAY 90 tablet 3  . aspirin 81 MG tablet Take 81 mg by mouth daily.    Marland Kitchen atenolol (TENORMIN) 50 MG tablet TAKE 1 TABLET BY MOUTH EVERY DAY 90 tablet 3  . atorvastatin (LIPITOR) 80 MG tablet TAKE 1 TABLET BY MOUTH EVERY DAY 90 tablet 3  .  benzonatate (TESSALON PERLES) 100 MG capsule 1-2 tab by mouth three times per day as needed for cough 60 capsule 0  . Blood Glucose Monitoring Suppl (FREESTYLE LITE) DEVI Use as directed twice per day E11.9 1 each 0  . [facility] VITAMIN B12 1000 MCG tablet TAKE 1 TABLET BY MOUTH EVERY DAY 90 tablet 3  . glipiZIDE (GLUCOTROL XL) 10 MG 24 hr tablet TAKE 1 TABLET BY MOUTH EVERY DAY WITH BREAKFAST 90 tablet 0  . glucose blood (FREESTYLE LITE) test strip Use to check blood sugar twice a day 200 each 5  . JANUVIA 100 MG tablet TAKE 1 TABLET BY MOUTH EVERY DAY 90 tablet 1  . Lancets (FREESTYLE) lancets Use as directed twice per day E11.9 100 each 3  . lisinopril-hydrochlorothiazide (ZESTORETIC) 20-12.5 MG  tablet TAKE 2 TABLETS BY MOUTH EVERY DAY 180 tablet 3  . loratadine (CLARITIN) 10 MG tablet Take 10 mg by mouth as needed.    . metFORMIN (GLUCOPHAGE) 500 MG tablet TAKE 2 TABLETS BY MOUTH 2 (TWO) TIMES DAILY WITH A MEAL. 360 tablet 3  . niacin 500 MG tablet Take 3 tablets (1,500 mg total) by mouth daily with breakfast. 270 tablet 3  . omeprazole (PRILOSEC) 20 MG capsule TAKE ONE CAPSULE BY MOUTH DAILY 90 capsule 2  . Vitamin D, Ergocalciferol, (DRISDOL) 1.25 MG (50000 UNIT) CAPS capsule Take 1 capsule (50,000 Units total) by mouth every 7 (seven) days. 12 capsule 0   No current facility-administered medications on file prior to visit.        ROS:  All others reviewed and negative.  Objective        PE:  BP 122/70 (BP Location: Left Arm, Patient Position: Sitting, Cuff Size: Large)   Pulse 65   Temp 98.5 F (36.9 C) (Oral)   Ht 6' (1.829 m)   Wt 291 lb (132 kg)   SpO2 95%   BMI 39.47 kg/m                 Constitutional: Pt appears in NAD               HENT: Head: NCAT.                Right Ear: External ear normal.                 Left Ear: External ear normal.                Eyes: . Pupils are equal, round, and reactive to light. Conjunctivae and EOM are normal               Nose: without d/c or deformity               Neck: Neck supple. Gross normal ROM               Cardiovascular: Normal rate and regular rhythm.                 Pulmonary/Chest: Effort normal and breath sounds without rales or wheezing.                Abd:  Soft, NT, ND, + BS, no organomegaly               Neurological: Pt is alert. At baseline orientation, motor grossly intact               Skin: Skin is warm. No rashes, no other new lesions, LE edema -  none               Psychiatric: Pt behavior is normal without agitation   Micro: none  Cardiac tracings I have personally interpreted today:  none  Pertinent Radiological findings (summarize): none   Lab Results  Component Value Date   WBC 11.5 (H)  08/01/2020   HGB 12.8 (L) 08/01/2020   HCT 38.8 (L) 08/01/2020   PLT 200.0 08/01/2020   GLUCOSE 128 (H) 08/01/2020   CHOL 153 08/01/2020   TRIG 207.0 (H) 08/01/2020   HDL 29.80 (L) 08/01/2020   LDLDIRECT 85.0 08/01/2020   LDLCALC 71 01/20/2017   ALT 14 08/01/2020   AST 12 08/01/2020   NA 141 08/01/2020   K 4.4 08/01/2020   CL 102 08/01/2020   CREATININE 1.31 08/01/2020   BUN 29 (H) 08/01/2020   CO2 26 08/01/2020   TSH 3.53 08/01/2020   PSA 1.24 08/01/2020   HGBA1C 6.5 08/01/2020   MICROALBUR 3.8 (H) 08/01/2020   Assessment/Plan:  Joseph Maxwell is a 65 y.o. White or Caucasian [1] male with  has a past medical history of ALLERGIC RHINITIS, BACK PAIN, Cervical disc disease, DIABETES MELLITUS, DIVERTICULOSIS, COLON, ERECTILE DYSFUNCTION, FAMILIAL TREMOR, GERD, HYPERLIPIDEMIA, HYPERTENSION, OBSTRUCTIVE SLEEP APNEA, ONYCHOMYCOSIS, TOENAILS, PLANTAR FASCIITIS, LEFT, and Unspecified asthma(493.90).  B12 deficiency Lab Results  Component Value Date   VITAMINB12 973 (H) 08/01/2020   Stable, cont oral replacement - b12 1000 mcg qd  Vitamin D deficiency Last vitamin D Lab Results  Component Value Date   VD25OH 56.71 08/01/2020   Stable, cont oral replacement   Hyperlipidemia Lab Results  Component Value Date   LDLCALC 71 01/20/2017   uncontrolled,, pt to continue current statin lipitor and add zetia  Encounter for well adult exam with abnormal findings Age and sex appropriate education and counseling updated with regular exercise and diet Referrals for preventative services - pt to call for eye exam himself Immunizations addressed - none needed Smoking counseling  - none needed Evidence for depression or other mood disorder - none significant Most recent labs reviewed. I have personally reviewed and have noted: 1) the patient's medical and social history 2) The patient's current medications and supplements 3) The patient's height, weight, and BMI have been recorded in  the chart   Essential hypertension BP Readings from Last 3 Encounters:  08/08/20 122/70  02/08/20 132/78  08/04/19 118/74   Stable, pt to continue medical treatment norvasc, tenormin   Diabetes Lab Results  Component Value Date   HGBA1C 6.5 08/01/2020   Stable, pt to continue current medical treatment glucotrol, metformin, januvia   CKD (chronic kidney disease) Lab Results  Component Value Date   CREATININE 1.31 08/01/2020   Stable overall, cont to avoid nephrotoxins   Followup: Return in about 6 months (around 02/08/2021).  Oliver Barre, MD 08/12/2020 8:31 PM [facility]

## 2020-08-08 NOTE — Patient Instructions (Addendum)
Please remember to call for your yearly eye exam.  Please take all new medication as prescribed - the zetia 10 mg per day  Please continue all other medications as before, and refills have been done if requested.  Please have the pharmacy call with any other refills you may need.  Please continue your efforts at being more active, low cholesterol diet, and weight control.  You are otherwise up to date with prevention measures today.  Please keep your appointments with your specialists as you may have planned  Please make an Appointment to return in 6 months, or sooner if needed

## 2020-08-12 ENCOUNTER — Encounter: Payer: Self-pay | Admitting: Internal Medicine

## 2020-08-12 NOTE — Assessment & Plan Note (Signed)
BP Readings from Last 3 Encounters:  08/08/20 122/70  02/08/20 132/78  08/04/19 118/74   Stable, pt to continue medical treatment norvasc, tenormin

## 2020-08-12 NOTE — Assessment & Plan Note (Signed)
Last vitamin D Lab Results  Component Value Date   VD25OH 56.71 08/01/2020   Stable, cont oral replacement

## 2020-08-12 NOTE — Assessment & Plan Note (Signed)
Lab Results  Component Value Date   VITAMINB12 973 (H) 08/01/2020   Stable, cont oral replacement - b12 1000 mcg qd

## 2020-08-12 NOTE — Assessment & Plan Note (Signed)
Lab Results  Component Value Date   HGBA1C 6.5 08/01/2020   Stable, pt to continue current medical treatment glucotrol, metformin, januvia

## 2020-08-12 NOTE — Assessment & Plan Note (Signed)
Age and sex appropriate education and counseling updated with regular exercise and diet Referrals for preventative services - pt to call for eye exam himself Immunizations addressed - none needed Smoking counseling  - none needed Evidence for depression or other mood disorder - none significant Most recent labs reviewed. I have personally reviewed and have noted: 1) the patient's medical and social history 2) The patient's current medications and supplements 3) The patient's height, weight, and BMI have been recorded in the chart

## 2020-08-12 NOTE — Assessment & Plan Note (Signed)
Lab Results  Component Value Date   CREATININE 1.31 08/01/2020   Stable overall, cont to avoid nephrotoxins

## 2020-08-12 NOTE — Assessment & Plan Note (Addendum)
Lab Results  Component Value Date   LDLCALC 71 01/20/2017   uncontrolled,, pt to continue current statin lipitor and add zetia

## 2020-09-25 ENCOUNTER — Other Ambulatory Visit: Payer: Self-pay | Admitting: Internal Medicine

## 2020-09-25 NOTE — Telephone Encounter (Signed)
Please refill as per office routine med refill policy (all routine meds refilled for 3 mo or monthly per pt preference up to one year from last visit, then month to month grace period for 3 mo, then further med refills will have to be denied)  

## 2020-10-20 ENCOUNTER — Other Ambulatory Visit: Payer: Self-pay | Admitting: Internal Medicine

## 2020-10-20 NOTE — Telephone Encounter (Signed)
Please refill as per office routine med refill policy (all routine meds refilled for 3 mo or monthly per pt preference up to one year from last visit, then month to month grace period for 3 mo, then further med refills will have to be denied)  

## 2020-11-28 ENCOUNTER — Encounter: Payer: Self-pay | Admitting: Internal Medicine

## 2020-11-28 DIAGNOSIS — E119 Type 2 diabetes mellitus without complications: Secondary | ICD-10-CM | POA: Diagnosis not present

## 2020-11-28 LAB — HM DIABETES EYE EXAM

## 2020-12-16 ENCOUNTER — Other Ambulatory Visit: Payer: Self-pay | Admitting: Internal Medicine

## 2021-01-20 ENCOUNTER — Other Ambulatory Visit: Payer: Self-pay | Admitting: Internal Medicine

## 2021-01-20 NOTE — Telephone Encounter (Signed)
Please refill as per office routine med refill policy (all routine meds to be refilled for 3 mo or monthly (per pt preference) up to one year from last visit, then month to month grace period for 3 mo, then further med refills will have to be denied) ? ?

## 2021-01-21 ENCOUNTER — Other Ambulatory Visit: Payer: Self-pay | Admitting: Internal Medicine

## 2021-01-21 NOTE — Telephone Encounter (Signed)
Please refill as per office routine med refill policy (all routine meds to be refilled for 3 mo or monthly (per pt preference) up to one year from last visit, then month to month grace period for 3 mo, then further med refills will have to be denied) ? ?

## 2021-01-31 ENCOUNTER — Other Ambulatory Visit (INDEPENDENT_AMBULATORY_CARE_PROVIDER_SITE_OTHER): Payer: BC Managed Care – PPO

## 2021-01-31 ENCOUNTER — Other Ambulatory Visit: Payer: Self-pay | Admitting: Internal Medicine

## 2021-01-31 ENCOUNTER — Telehealth: Payer: Self-pay

## 2021-01-31 DIAGNOSIS — E538 Deficiency of other specified B group vitamins: Secondary | ICD-10-CM

## 2021-01-31 DIAGNOSIS — E559 Vitamin D deficiency, unspecified: Secondary | ICD-10-CM | POA: Diagnosis not present

## 2021-01-31 DIAGNOSIS — E1165 Type 2 diabetes mellitus with hyperglycemia: Secondary | ICD-10-CM | POA: Diagnosis not present

## 2021-01-31 LAB — BASIC METABOLIC PANEL
BUN: 25 mg/dL — ABNORMAL HIGH (ref 6–23)
CO2: 27 mEq/L (ref 19–32)
Calcium: 9.6 mg/dL (ref 8.4–10.5)
Chloride: 102 mEq/L (ref 96–112)
Creatinine, Ser: 1.25 mg/dL (ref 0.40–1.50)
GFR: 61.25 mL/min (ref >60.00)
Glucose, Bld: 149 mg/dL — ABNORMAL HIGH (ref 70–99)
Potassium: 4.4 mEq/L (ref 3.5–5.1)
Sodium: 138 mEq/L (ref 135–145)

## 2021-01-31 LAB — LIPID PANEL
Cholesterol: 120 mg/dL (ref 0–200)
HDL: 32.1 mg/dL — ABNORMAL LOW (ref >39.00)
NonHDL: 87.9
Total CHOL/HDL Ratio: 4
Triglycerides: 202 mg/dL — ABNORMAL HIGH (ref 0.0–149.0)
VLDL: 40.4 mg/dL — ABNORMAL HIGH (ref 0.0–40.0)

## 2021-01-31 LAB — HEPATIC FUNCTION PANEL
ALT: 14 U/L (ref 0–53)
AST: 13 U/L (ref 0–37)
Albumin: 4.4 g/dL (ref 3.5–5.2)
Alkaline Phosphatase: 83 U/L (ref 39–117)
Bilirubin, Direct: 0.1 mg/dL (ref 0.0–0.3)
Total Bilirubin: 0.6 mg/dL (ref 0.2–1.2)
Total Protein: 7.9 g/dL (ref 6.0–8.3)

## 2021-01-31 LAB — LDL CHOLESTEROL, DIRECT: Direct LDL: 67 mg/dL

## 2021-01-31 LAB — VITAMIN B12: Vitamin B-12: 496 pg/mL (ref 211–911)

## 2021-01-31 LAB — VITAMIN D 25 HYDROXY (VIT D DEFICIENCY, FRACTURES): VITD: 42.59 ng/mL (ref 30.00–100.00)

## 2021-01-31 LAB — HEMOGLOBIN A1C: Hgb A1c MFr Bld: 7.2 % — ABNORMAL HIGH (ref 4.6–6.5)

## 2021-01-31 NOTE — Telephone Encounter (Signed)
Pt called requesting lab orders. Labs were put in.

## 2021-02-07 ENCOUNTER — Other Ambulatory Visit: Payer: Self-pay

## 2021-02-07 ENCOUNTER — Encounter: Payer: Self-pay | Admitting: Internal Medicine

## 2021-02-07 ENCOUNTER — Ambulatory Visit: Payer: BC Managed Care – PPO | Admitting: Internal Medicine

## 2021-02-07 VITALS — BP 124/70 | HR 63 | Temp 98.9°F | Ht 72.0 in | Wt 297.0 lb

## 2021-02-07 DIAGNOSIS — E1165 Type 2 diabetes mellitus with hyperglycemia: Secondary | ICD-10-CM

## 2021-02-07 DIAGNOSIS — E538 Deficiency of other specified B group vitamins: Secondary | ICD-10-CM

## 2021-02-07 DIAGNOSIS — L989 Disorder of the skin and subcutaneous tissue, unspecified: Secondary | ICD-10-CM | POA: Diagnosis not present

## 2021-02-07 DIAGNOSIS — E559 Vitamin D deficiency, unspecified: Secondary | ICD-10-CM

## 2021-02-07 DIAGNOSIS — I10 Essential (primary) hypertension: Secondary | ICD-10-CM

## 2021-02-07 DIAGNOSIS — E7800 Pure hypercholesterolemia, unspecified: Secondary | ICD-10-CM

## 2021-02-07 DIAGNOSIS — Z23 Encounter for immunization: Secondary | ICD-10-CM | POA: Diagnosis not present

## 2021-02-07 NOTE — Progress Notes (Signed)
Patient ID: Joseph Maxwell, male   DOB: Nov 22, 1955, 64 y.o.   MRN: [id number]        Chief Complaint: follow up HTN, HLD and hyperglycemia , low vit d and b12, nasal skin lesion       HPI:  Joseph Maxwell is a 65 y.o. male here overall doing ok, has a new mid nasal skin lesion, raised and red, nontender, non healing.  Pt denies chest pain, increased sob or doe, wheezing, orthopnea, PND, increased LE swelling, palpitations, dizziness or syncope.   Pt denies polydipsia, polyuria, or low sugar symptoms but admits to dietary non compliance recenlty with vacations.  Taking Vit D and b12.  Now taking the omperazole at [facility], also tyring to take less nsaid, and renal function improved this visit.  Wt Readings from Last 3 Encounters:  02/07/21 297 lb (134.7 kg)  08/08/20 291 lb (132 kg)  02/08/20 296 lb 12.8 oz (134.6 kg)   BP Readings from Last 3 Encounters:  02/07/21 124/70  08/08/20 122/70  02/08/20 132/78         Past Medical History:  Diagnosis Date   ALLERGIC RHINITIS    BACK PAIN    Cervical disc disease    DIABETES MELLITUS    DIVERTICULOSIS, COLON    ERECTILE DYSFUNCTION    FAMILIAL TREMOR    GERD    HYPERLIPIDEMIA    HYPERTENSION    OBSTRUCTIVE SLEEP APNEA    ONYCHOMYCOSIS, TOENAILS    PLANTAR FASCIITIS, LEFT    Unspecified asthma(493.90)    Past Surgical History:  Procedure Laterality Date   NECK SURGERY  04/08/10   2 fusion and disctecomy/Dr. Wynetta Emery    reports that he has never smoked. He has never used smokeless tobacco. He reports current alcohol use. He reports that he does not use drugs. family history includes Diabetes in his father, mother, and sister; Heart failure in his mother; Hypertension in an other family member; Lung cancer in an other family member; Prostate cancer in his father; Sleep apnea in his mother; Stroke in his mother. Allergies  Allergen Reactions   Hydrocodone     REACTION: anxiety reaction   Epinephrine Anxiety   Current Outpatient  Medications on File Prior to Visit  Medication Sig Dispense Refill   albuterol (VENTOLIN HFA) 108 (90 Base) MCG/ACT inhaler INHALE TWO PUFFS BY MOUTH FOUR TIMES DAILY AS NEEDED 8.5 g 2   ALPRAZolam (XANAX) 0.5 MG tablet TAKE 1 TABLET BY MOUTH 2 TIMES DAILY AS NEEDED. 60 tablet 5   amLODipine (NORVASC) 2.5 MG tablet TAKE 1 TABLET BY MOUTH EVERY DAY 90 tablet 1   aspirin 81 MG tablet Take 81 mg by mouth daily.     atenolol (TENORMIN) 50 MG tablet TAKE 1 TABLET BY MOUTH EVERY DAY 90 tablet 3   atorvastatin (LIPITOR) 80 MG tablet TAKE 1 TABLET BY MOUTH EVERY DAY 90 tablet 3   benzonatate (TESSALON PERLES) 100 MG capsule 1-2 tab by mouth three times per day as needed for cough 60 capsule 0   Blood Glucose Monitoring Suppl (FREESTYLE LITE) DEVI Use as directed twice per day E11.9 1 each 0   Cholecalciferol (VITAMIN D3) 50 MCG (2000 UT) TABS Take by mouth.     [facility] VITAMIN B12 1000 MCG tablet TAKE 1 TABLET BY MOUTH EVERY DAY (Patient taking differently: Take 1,000 mcg by mouth every other day.) 90 tablet 3   ezetimibe (ZETIA) 10 MG tablet Take 1 tablet (10 mg total) by mouth daily. 90 tablet  3   glipiZIDE (GLUCOTROL XL) 10 MG 24 hr tablet TAKE 1 TABLET BY MOUTH EVERY DAY WITH BREAKFAST 90 tablet 0   glucose blood (FREESTYLE LITE) test strip Use to check blood sugar twice a day 200 each 5   JANUVIA 100 MG tablet TAKE 1 TABLET BY MOUTH EVERY DAY 90 tablet 1   Lancets (FREESTYLE) lancets Use as directed twice per day E11.9 100 each 3   lisinopril-hydrochlorothiazide (ZESTORETIC) 20-12.5 MG tablet TAKE 2 TABLETS BY MOUTH EVERY DAY 180 tablet 1   loratadine (CLARITIN) 10 MG tablet Take 10 mg by mouth as needed.     metFORMIN (GLUCOPHAGE) 500 MG tablet TAKE 2 TABLETS BY MOUTH 2 (TWO) TIMES DAILY WITH A MEAL. 360 tablet 3   niacin 500 MG tablet Take 3 tablets (1,500 mg total) by mouth daily with breakfast. 270 tablet 3   omeprazole (PRILOSEC) 20 MG capsule TAKE ONE CAPSULE BY MOUTH DAILY (Patient taking  differently: Take 20 mg by mouth every other day.) 90 capsule 2   No current facility-administered medications on file prior to visit.        ROS:  All others reviewed and negative.  Objective        PE:  BP 124/70 (BP Location: Right Arm, Patient Position: Sitting, Cuff Size: Large)   Pulse 63   Temp 98.9 F (37.2 C) (Oral)   Ht 6' (1.829 m)   Wt 297 lb (134.7 kg)   SpO2 96%   BMI 40.28 kg/m                 Constitutional: Pt appears in NAD               HENT: Head: NCAT.                Right Ear: External ear normal.                 Left Ear: External ear normal.                Eyes: . Pupils are equal, round, and reactive to light. Conjunctivae and EOM are normal               Nose: without d/c or deformity               Neck: Neck supple. Gross normal ROM               Cardiovascular: Normal rate and regular rhythm.                 Pulmonary/Chest: Effort normal and breath sounds without rales or wheezing.                Abd:  Soft, NT, ND, + BS, no organomegaly               Neurological: Pt is alert. At baseline orientation, motor grossly intact               Skin: Skin is warm. No rashes, no other new lesions, LE edema - trace bilateral               Psychiatric: Pt behavior is normal without agitation   Micro: none  Cardiac tracings I have personally interpreted today:  none  Pertinent Radiological findings (summarize): none   Lab Results  Component Value Date   WBC 11.5 (H) 08/01/2020   HGB 12.8 (L) 08/01/2020   HCT 38.8 (L) 08/01/2020   PLT 200.0  08/01/2020   GLUCOSE 149 (H) 01/31/2021   CHOL 120 01/31/2021   TRIG 202.0 (H) 01/31/2021   HDL 32.10 (L) 01/31/2021   LDLDIRECT 67.0 01/31/2021   LDLCALC 71 01/20/2017   ALT 14 01/31/2021   AST 13 01/31/2021   NA 138 01/31/2021   K 4.4 01/31/2021   CL 102 01/31/2021   CREATININE 1.25 01/31/2021   BUN 25 (H) 01/31/2021   CO2 27 01/31/2021   TSH 3.53 08/01/2020   PSA 1.24 08/01/2020   HGBA1C 7.2 (H)  01/31/2021   MICROALBUR 3.8 (H) 08/01/2020   Assessment/Plan:  Joseph Maxwell is a 65 y.o. White or Caucasian [1] male with  has a past medical history of ALLERGIC RHINITIS, BACK PAIN, Cervical disc disease, DIABETES MELLITUS, DIVERTICULOSIS, COLON, ERECTILE DYSFUNCTION, FAMILIAL TREMOR, GERD, HYPERLIPIDEMIA, HYPERTENSION, OBSTRUCTIVE SLEEP APNEA, ONYCHOMYCOSIS, TOENAILS, PLANTAR FASCIITIS, LEFT, and Unspecified asthma(493.90).  Non-healing skin lesion of nose C/w probable basal cell ca - for derm referral but pt wants to defer for now and call me with name of dermatologist he prefers  Diabetes [facility]) Lab Results  Component Value Date   HGBA1C 7.2 (H) 01/31/2021   Mild uncontrolled due to dietary noncompliacne, pt to continue current medical treatment glucotrol, metformin as prefers to work on diet compliance   Hyperlipidemia Lab Results  Component Value Date   LDLCALC 71 01/20/2017   Mild uncontroled, goal ldl < 70, pt to continue current statin lipitor 80 and zetia, delcines change, also for cardiac ct score  Essential hypertension BP Readings from Last 3 Encounters:  02/07/21 124/70  08/08/20 122/70  02/08/20 132/78   Stable, pt to continue medical treatment tenormin, zestoretic   B12 deficiency Lab Results  Component Value Date   VITAMINB12 496 01/31/2021   Stable, cont oral replacement - b12 1000 mcg qd   Vitamin D deficiency Last vitamin D Lab Results  Component Value Date   VD25OH 42.59 01/31/2021   Stable, cont oral replacement  Followup: Return in about 6 months (around 08/07/2021).  Oliver Barre, MD 02/07/2021 9:48 PM [facility]

## 2021-02-07 NOTE — Assessment & Plan Note (Addendum)
Lab Results  Component Value Date   LDLCALC 71 01/20/2017   Mild uncontroled, goal ldl < 70, pt to continue current statin lipitor 80 and zetia, delcines change, also for cardiac ct score

## 2021-02-07 NOTE — Assessment & Plan Note (Signed)
Lab Results  Component Value Date   HGBA1C 7.2 (H) 01/31/2021   Mild uncontrolled due to dietary noncompliacne, pt to continue current medical treatment glucotrol, metformin as prefers to work on diet compliance

## 2021-02-07 NOTE — Assessment & Plan Note (Signed)
Lab Results  Component Value Date   VITAMINB12 496 01/31/2021   Stable, cont oral replacement - b12 1000 mcg qd

## 2021-02-07 NOTE — Assessment & Plan Note (Signed)
Last vitamin D Lab Results  Component Value Date   VD25OH 42.59 01/31/2021   Stable, cont oral replacement

## 2021-02-07 NOTE — Assessment & Plan Note (Signed)
BP Readings from Last 3 Encounters:  02/07/21 124/70  08/08/20 122/70  02/08/20 132/78   Stable, pt to continue medical treatment tenormin, zestoretic

## 2021-02-07 NOTE — Patient Instructions (Addendum)
[facility]  We have discussed the Cardiac CT Score test to measure the calcification level (if any) in your heart arteries.  This test has been ordered in our Computer System, so please call [facility] directly, as they prefer this, at [phone] to be scheduled.  Please remember to call back with the name of the dermatologist to refer for your nose skin lesion which appears to be consistent with Basal cell carcinoma  Please continue all other medications as before, and refills have been done if requested.  Please have the pharmacy call with any other refills you may need.  Please continue your efforts at being more active, low cholesterol diet, and weight control.  Please keep your appointments with your specialists as you may have planned  Please make an Appointment to return in 6 months, or sooner if needed, also with Lab Appointment for testing done 3-5 days before at the FIRST FLOOR Lab (so this is for TWO appointments - please see the scheduling desk as you leave)  Due to the ongoing Covid 19 pandemic, our lab now requires an appointment for any labs done at our office.  If you need labs done and do not have an appointment, please call our office ahead of time to schedule before presenting to the lab for your testing.

## 2021-02-07 NOTE — Assessment & Plan Note (Signed)
C/w probable basal cell ca - for derm referral but pt wants to defer for now and call me with name of dermatologist he prefers

## 2021-02-10 ENCOUNTER — Encounter: Payer: Self-pay | Admitting: Internal Medicine

## 2021-02-10 DIAGNOSIS — L989 Disorder of the skin and subcutaneous tissue, unspecified: Secondary | ICD-10-CM

## 2021-02-22 ENCOUNTER — Other Ambulatory Visit: Payer: Self-pay | Admitting: Internal Medicine

## 2021-02-22 NOTE — Telephone Encounter (Signed)
Please refill as per office routine med refill policy (all routine meds to be refilled for 3 mo or monthly (per pt preference) up to one year from last visit, then month to month grace period for 3 mo, then further med refills will have to be denied) ? ?

## 2021-02-23 ENCOUNTER — Encounter: Payer: Self-pay | Admitting: Internal Medicine

## 2021-02-25 ENCOUNTER — Telehealth: Payer: Self-pay | Admitting: Internal Medicine

## 2021-02-25 NOTE — Telephone Encounter (Signed)
[facility] 02/23/2021  Caller states he tested positive for covid19 today. he is having shortness of breath and trouble sleeping at night. coughing , stuffy head. symp started 24 hours ago. he has asthma, inhaler helps some but not completely. temp 99.9  Advised to call PCP within 24 hrs. Patient understood and decided to go to UC.   2nd call   Needs refill on his inhaler, Albuterol , will be out of current inhaler this weekend. He states he had an attack yesterday. Not having any difficulty breathing now. At this moment no symptoms, or worsening symptoms. [facility] [address] [place] [phone] Client directives: Do not call for Meds after-hours. Nurse may call in refills on maintenance medications Volume sufficient until office opens o Refills only called into pharmacy where originally filled, otherwise will have to call back once the office opens.  Called in one refill to pharmacy per office directives.  Albuterol HSA Inhaler (refill) Yes Inhaler 2 puffs Q 4 hr PRN As Needed 1 inhaler refill.  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

## 2021-02-26 MED ORDER — ALBUTEROL SULFATE HFA 108 (90 BASE) MCG/ACT IN AERS: INHALATION_SPRAY | RESPIRATORY_TRACT | 2 refills | Status: DC

## 2021-03-19 DIAGNOSIS — C44311 Basal cell carcinoma of skin of nose: Secondary | ICD-10-CM | POA: Diagnosis not present

## 2021-03-19 DIAGNOSIS — D485 Neoplasm of uncertain behavior of skin: Secondary | ICD-10-CM | POA: Diagnosis not present

## 2021-04-20 ENCOUNTER — Other Ambulatory Visit: Payer: Self-pay | Admitting: Internal Medicine

## 2021-04-20 NOTE — Telephone Encounter (Signed)
Please refill as per office routine med refill policy (all routine meds to be refilled for 3 mo or monthly (per pt preference) up to one year from last visit, then month to month grace period for 3 mo, then further med refills will have to be denied) ? ?

## 2021-05-29 DIAGNOSIS — Z481 Encounter for planned postprocedural wound closure: Secondary | ICD-10-CM | POA: Diagnosis not present

## 2021-05-29 DIAGNOSIS — C4491 Basal cell carcinoma of skin, unspecified: Secondary | ICD-10-CM | POA: Diagnosis not present

## 2021-05-29 DIAGNOSIS — C44311 Basal cell carcinoma of skin of nose: Secondary | ICD-10-CM | POA: Diagnosis not present

## 2021-06-10 ENCOUNTER — Other Ambulatory Visit: Payer: Self-pay | Admitting: Internal Medicine

## 2021-06-10 NOTE — Telephone Encounter (Signed)
Please refill as per office routine med refill policy (all routine meds to be refilled for 3 mo or monthly (per pt preference) up to one year from last visit, then month to month grace period for 3 mo, then further med refills will have to be denied) ? ?

## 2021-07-15 ENCOUNTER — Encounter (HOSPITAL_COMMUNITY): Payer: Self-pay

## 2021-07-15 ENCOUNTER — Ambulatory Visit (INDEPENDENT_AMBULATORY_CARE_PROVIDER_SITE_OTHER): Payer: BC Managed Care – PPO

## 2021-07-15 ENCOUNTER — Ambulatory Visit (HOSPITAL_COMMUNITY)
Admission: EM | Admit: 2021-07-15 | Discharge: 2021-07-15 | Disposition: A | Discharge status: Home / Self Care | Payer: BC Managed Care – PPO | Attending: Sports Medicine | Admitting: Sports Medicine

## 2021-07-15 ENCOUNTER — Other Ambulatory Visit: Payer: Self-pay

## 2021-07-15 DIAGNOSIS — W540XXA Bitten by dog, initial encounter: Secondary | ICD-10-CM

## 2021-07-15 DIAGNOSIS — S61256A Open bite of right little finger without damage to nail, initial encounter: Secondary | ICD-10-CM | POA: Diagnosis not present

## 2021-07-15 DIAGNOSIS — S61451A Open bite of right hand, initial encounter: Secondary | ICD-10-CM

## 2021-07-15 MED ORDER — AMOXICILLIN-POT CLAVULANATE 875-125 MG PO TABS: 1.0000 | ORAL_TABLET | Freq: Two times a day (BID) | ORAL | 0 refills | Status: AC

## 2021-07-15 NOTE — ED Triage Notes (Signed)
Pt reports a dog bite to the R small finger.  ? ?States it happened Thursday. States his finger was sore and states Saturday he noticed redness.  ?

## 2021-07-15 NOTE — Discharge Instructions (Addendum)
Augmetin --> 1 pill twice a day with food for the next 6 days ? ?Look for worsening signs of redness, spreading redness, fever or chills, this would require you to present back to the urgent care/ED ?

## 2021-07-15 NOTE — ED Provider Notes (Addendum)
[facility]    CSN: [id number] Arrival date & time: 07/15/21  5631      History   Chief Complaint Chief Complaint  Patient presents with   Animal Bite    HPI Joseph Maxwell is a 66 y.o. male who presents for dog bite of the right pinky finger.   Animal Bite Associated symptoms: no fever    Patient states on Thursday evening he was at home separating 2 of his dogs that were being aggressive with 1 another due to food, when he went to separate the dogs his Yorkie-Doxen mix dog accidentally bit his right pinky finger.  He did note a small puncture wound on the dorsal aspect of the pinky finger.  He was able to achieve hemostasis with some pressure.  He had just been monitoring this by until this time, although his redness did not get any better and he feels it has started to slightly spread around the bite wound.  He reports that his dogs are up-to-date on the rabies vaccinations. No concern for rabies. He reports full range of motion of the finger.  He does note some swelling and some redness around the dog bite.  He denies any antibiotic allergies.   Last tetanus shot: 01/2018 (within last 5 years: yes)   Past Medical History:  Diagnosis Date   ALLERGIC RHINITIS    BACK PAIN    Cervical disc disease    DIABETES MELLITUS    DIVERTICULOSIS, COLON    ERECTILE DYSFUNCTION    FAMILIAL TREMOR    GERD    HYPERLIPIDEMIA    HYPERTENSION    OBSTRUCTIVE SLEEP APNEA    ONYCHOMYCOSIS, TOENAILS    PLANTAR FASCIITIS, LEFT    Unspecified asthma(493.90)     Patient Active Problem List   Diagnosis Date Noted   Non-healing skin lesion of nose 02/07/2021   URI (upper respiratory infection) 05/28/2020   CKD (chronic kidney disease) 02/12/2020   B12 deficiency 08/04/2019   Vitamin D deficiency 08/04/2019   Anxiety state 01/30/2015   Anemia 07/28/2014   Cervical disc disease 12/09/2010   Encounter for well adult exam with abnormal findings 12/01/2010   Sleep apnea 01/25/2010    BACK PAIN 01/18/2010   DIVERTICULOSIS, COLON 12/12/2009   PLANTAR FASCIITIS, LEFT 12/12/2009   Tremor 05/08/2008   GERD 05/08/2008   RASH-NONVESICULAR 05/08/2008   ALLERGIC RHINITIS 03/16/2007   Asthma 03/16/2007   Diabetes (HCC) 03/08/2007   Hyperlipidemia 03/08/2007   ERECTILE DYSFUNCTION 03/08/2007   Essential hypertension 03/08/2007    Past Surgical History:  Procedure Laterality Date   NECK SURGERY  04/08/10   2 fusion and disctecomy/Dr. Wynetta Emery       Home Medications    Prior to Admission medications   Medication Sig Start Date End Date Taking? Authorizing Provider  amoxicillin-clavulanate (AUGMENTIN) 875-125 MG tablet Take 1 tablet by mouth every 12 (twelve) hours for 6 days. 07/15/21 07/21/21 Yes Madelyn Brunner, DO  lisinopril-hydrochlorothiazide (ZESTORETIC) 20-12.5 MG tablet TAKE 2 TABLETS BY MOUTH EVERY DAY 06/11/21   Corwin Levins, MD  albuterol (VENTOLIN HFA) [phone] Base) MCG/ACT inhaler INHALE TWO PUFFS BY MOUTH FOUR TIMES DAILY AS NEEDED 02/26/21   Corwin Levins, MD  ALPRAZolam Prudy Feeler) 0.5 MG tablet TAKE 1 TABLET BY MOUTH 2 TIMES DAILY AS NEEDED. 12/16/20   Corwin Levins, MD  amLODipine (NORVASC) 2.5 MG tablet TAKE 1 TABLET BY MOUTH EVERY DAY 01/31/21   Corwin Levins, MD  aspirin 81 MG tablet Take  81 mg by mouth daily.    [provider]  atenolol (TENORMIN) 50 MG tablet TAKE 1 TABLET BY MOUTH EVERY DAY 01/22/21   Corwin LevinsJohn, James W, MD  atorvastatin (LIPITOR) 80 MG tablet TAKE 1 TABLET BY MOUTH EVERY DAY 02/25/21   Corwin LevinsJohn, James W, MD  benzonatate (TESSALON PERLES) 100 MG capsule 1-2 tab by mouth three times per day as needed for cough 05/17/20   Corwin LevinsJohn, James W, MD  Blood Glucose Monitoring Suppl (FREESTYLE LITE) DEVI Use as directed twice per day E11.9 02/02/18   Corwin LevinsJohn, James W, MD  Cholecalciferol (VITAMIN D3) 50 MCG (2000 UT) TABS Take by mouth.    [provider]  [facility] VITAMIN B12 1000 MCG tablet TAKE 1 TABLET BY MOUTH EVERY DAY Patient taking differently: Take  1,000 mcg by mouth every other day. 07/11/20   Corwin LevinsJohn, James W, MD  ezetimibe (ZETIA) 10 MG tablet Take 1 tablet (10 mg total) by mouth daily. 08/08/20   Corwin LevinsJohn, James W, MD  glipiZIDE (GLUCOTROL XL) 10 MG 24 hr tablet TAKE 1 TABLET BY MOUTH EVERY DAY WITH BREAKFAST 04/22/21   Corwin LevinsJohn, James W, MD  glucose blood (FREESTYLE LITE) test strip Use to check blood sugar twice a day 02/08/20   Corwin LevinsJohn, James W, MD  JANUVIA 100 MG tablet TAKE 1 TABLET BY MOUTH EVERY DAY 02/25/21   Corwin LevinsJohn, James W, MD  Lancets (FREESTYLE) lancets Use as directed twice per day E11.9 02/02/18   Corwin LevinsJohn, James W, MD  loratadine (CLARITIN) 10 MG tablet Take 10 mg by mouth as needed.    [provider]  metFORMIN (GLUCOPHAGE) 500 MG tablet TAKE 2 TABLETS BY MOUTH 2 (TWO) TIMES DAILY WITH A MEAL. 02/25/21   Corwin LevinsJohn, James W, MD  niacin 500 MG tablet Take 3 tablets (1,500 mg total) by mouth daily with breakfast. 06/12/11   Corwin LevinsJohn, James W, MD  omeprazole (PRILOSEC) 20 MG capsule TAKE ONE CAPSULE BY MOUTH DAILY Patient taking differently: Take 20 mg by mouth every other day. 07/26/20   Corwin LevinsJohn, James W, MD    Family History Family History  Problem Relation Age of Onset   Diabetes Mother    Sleep apnea Mother    Heart failure Mother    Stroke Mother    Prostate cancer Father    Diabetes Father    Diabetes Sister    Hypertension Other    Lung cancer Other        great-grandfather    Social History Social History   Tobacco Use   Smoking status: Never   Smokeless tobacco: Never  Vaping Use   Vaping Use: Never used  Substance Use Topics   Alcohol use: Yes    Comment: Rare   Drug use: No     Allergies   Hydrocodone and Epinephrine   Review of Systems Review of Systems  Constitutional:  Negative for chills and fever.  Musculoskeletal:  Positive for joint swelling.  Skin:  Positive for color change and wound (right pinky).  Neurological:  Negative for weakness.  Hematological:  Does not bruise/bleed easily.    Physical  Exam Triage Vital Signs ED Triage Vitals  Enc Vitals Group     BP 07/15/21 0903 (!) 150/84     Pulse Rate 07/15/21 0902 67     Resp 07/15/21 0902 17     Temp 07/15/21 0902 98.2 F (36.8 C)     Temp Source 07/15/21 0902 Oral     SpO2 07/15/21 0902 95 %  Weight --      Height --      Head Circumference --      Peak Flow --      Pain Score 07/15/21 0901 2     Pain Loc --      Pain Edu? --      Excl. in GC? --    No data found.  Updated Vital Signs BP (!) 150/84    Pulse 67    Temp 98.2 F (36.8 C) (Oral)    Resp 17    SpO2 95%   Physical Exam Gen: Well-appearing, in no acute distress; non-toxic CV: Well-perfused. Warm.  Resp: Breathing unlabored on room air; no wheezing. Psych: Fluid speech in conversation; appropriate affect; normal thought process Neuro: Sensation intact throughout. No gross coordination deficits.  MSK:  - Right pinky finger: + Notable scab on the dorsal aspect of the fifth finger between the MCP and PIP, there is an area of erythema approximately 2 cm circumferential, mild swelling.  There is no abscess palpated.  The wound is closed, with scab over the dorsal aspect, no drainage noted.  There is full range of motion of all 5 fingers in the pinky.  No extensor or flexor lag.  Sensation to light touch intact.  Neurovascular intact.  Cap refill less than 2 seconds.    UC Treatments / Results  Labs (all labs ordered are listed, but only abnormal results are displayed) Labs Reviewed - No data to display  EKG   Radiology DG Finger Little Right  Result Date: 07/15/2021 CLINICAL DATA:  Dog bite to right small finger 4 days ago. Evaluate for foreign object. EXAM: RIGHT LITTLE FINGER 2+V COMPARISON:  None. FINDINGS: Examination demonstrates no evidence of radiopaque foreign body. No air within the soft tissues. No fracture or dislocation. Mild degenerate change over the radiocarpal joint and radial side of the carpal bones. Ulnar minus variance. IMPRESSION:  No acute findings. No radiopaque foreign body. Electronically Signed   By: Elberta Fortis M.D.   On: 07/15/2021 09:52    Procedures Procedures (including critical care time)  Medications Ordered in UC Medications - No data to display  Initial Impression / Assessment and Plan / UC Course  I have reviewed the triage vital signs and the nursing notes.  Pertinent labs & imaging results that were available during my care of the patient were reviewed by me and considered in my medical decision making (see chart for details).     Dog bite of right pinky finger  Patient's own dog accidentally bit him on the right pinky finger, there is area of redness/cellulitis surrounding.  Wound was closed today, although superficially cleansed with Betadine and alcohol today. No palpable abscess noted.  X-ray did not demonstrate any foreign body. Patient is up-to-date on his tetanus shot, the dog is fully vaccinated, no concern for rabies at this time.  Full range of motion and strength of the pinky finger.  We will treat with Augmentin 875/125 mg twice daily for the next 6 days.  Return precautions provided such as worsening redness, developing fever or chills, or any other signs of illness.  Patient is safe for discharge home.  Follow-up with PCP as needed.  Final Clinical Impressions(s) / UC Diagnoses   Final diagnoses:  Dog bite of right hand, initial encounter     Discharge Instructions      Augmetin --> 1 pill twice a day with food for the next 6 days  Look for worsening  signs of redness, spreading redness, fever or chills, this would require you to present back to the urgent care/ED     ED Prescriptions     Medication Sig Dispense Auth. Provider   amoxicillin-clavulanate (AUGMENTIN) 875-125 MG tablet Take 1 tablet by mouth every 12 (twelve) hours for 6 days. 12 tablet Madelyn Brunner, DO      PDMP not reviewed this encounter.   Madelyn Brunner, DO 07/15/21 1052    Madelyn Brunner,  DO 07/15/21 1052

## 2021-07-20 ENCOUNTER — Other Ambulatory Visit: Payer: Self-pay | Admitting: Internal Medicine

## 2021-07-25 ENCOUNTER — Other Ambulatory Visit: Payer: Self-pay | Admitting: Internal Medicine

## 2021-07-25 NOTE — Telephone Encounter (Signed)
Please refill as per office routine med refill policy (all routine meds to be refilled for 3 mo or monthly (per pt preference) up to one year from last visit, then month to month grace period for 3 mo, then further med refills will have to be denied) ? ?

## 2021-08-01 ENCOUNTER — Other Ambulatory Visit: Payer: Self-pay | Admitting: Internal Medicine

## 2021-08-01 NOTE — Telephone Encounter (Signed)
Please refill as per office routine med refill policy (all routine meds to be refilled for 3 mo or monthly (per pt preference) up to one year from last visit, then month to month grace period for 3 mo, then further med refills will have to be denied) ? ?

## 2021-08-05 ENCOUNTER — Other Ambulatory Visit (INDEPENDENT_AMBULATORY_CARE_PROVIDER_SITE_OTHER): Payer: BC Managed Care – PPO

## 2021-08-05 ENCOUNTER — Other Ambulatory Visit: Payer: Self-pay

## 2021-08-05 DIAGNOSIS — E1165 Type 2 diabetes mellitus with hyperglycemia: Secondary | ICD-10-CM | POA: Diagnosis not present

## 2021-08-05 DIAGNOSIS — E559 Vitamin D deficiency, unspecified: Secondary | ICD-10-CM | POA: Diagnosis not present

## 2021-08-05 DIAGNOSIS — E538 Deficiency of other specified B group vitamins: Secondary | ICD-10-CM | POA: Diagnosis not present

## 2021-08-05 LAB — CBC WITH DIFFERENTIAL/PLATELET
Basophils Absolute: 0.1 10*3/uL (ref 0.0–0.1)
Basophils Relative: 0.5 % (ref 0.0–3.0)
Eosinophils Absolute: 0.4 10*3/uL (ref 0.0–0.7)
Eosinophils Relative: 4 % (ref 0.0–5.0)
HCT: 38.6 % — ABNORMAL LOW (ref 39.0–52.0)
Hemoglobin: 12.8 g/dL — ABNORMAL LOW (ref 13.0–17.0)
Lymphocytes Relative: 25.5 % (ref 12.0–46.0)
Lymphs Abs: 2.8 10*3/uL (ref 0.7–4.0)
MCHC: 33.2 g/dL (ref 30.0–36.0)
MCV: 79.8 fl (ref 78.0–100.0)
Monocytes Absolute: 0.9 10*3/uL (ref 0.1–1.0)
Monocytes Relative: 8.3 % (ref 3.0–12.0)
Neutro Abs: 6.7 10*3/uL (ref 1.4–7.7)
Neutrophils Relative %: 61.7 % (ref 43.0–77.0)
Platelets: 161 10*3/uL (ref 150.0–400.0)
RBC: 4.83 Mil/uL (ref 4.22–5.81)
RDW: 15.4 % (ref 11.5–15.5)
WBC: 10.9 10*3/uL — ABNORMAL HIGH (ref 4.0–10.5)

## 2021-08-05 LAB — BASIC METABOLIC PANEL
BUN: 26 mg/dL — ABNORMAL HIGH (ref 6–23)
CO2: 32 mEq/L (ref 19–32)
Calcium: 9.3 mg/dL (ref 8.4–10.5)
Chloride: 100 mEq/L (ref 96–112)
Creatinine, Ser: 1.3 mg/dL (ref 0.40–1.50)
GFR: 58.22 mL/min — ABNORMAL LOW (ref >60.00)
Glucose, Bld: 205 mg/dL — ABNORMAL HIGH (ref 70–99)
Potassium: 4.3 mEq/L (ref 3.5–5.1)
Sodium: 141 mEq/L (ref 135–145)

## 2021-08-05 LAB — URINALYSIS, ROUTINE W REFLEX MICROSCOPIC
Hgb urine dipstick: NEGATIVE
Leukocytes,Ua: NEGATIVE
Nitrite: NEGATIVE
Specific Gravity, Urine: >=1.03 — AB (ref 1.000–1.030)
Total Protein, Urine: 30 — AB
Urine Glucose: NEGATIVE
Urobilinogen, UA: 1 (ref 0.0–1.0)
pH: 5.5 (ref 5.0–8.0)

## 2021-08-05 LAB — LIPID PANEL
Cholesterol: 122 mg/dL (ref 0–200)
HDL: 33.4 mg/dL — ABNORMAL LOW (ref >39.00)
LDL Cholesterol: 52 mg/dL (ref 0–99)
NonHDL: 88.89
Total CHOL/HDL Ratio: 4
Triglycerides: 184 mg/dL — ABNORMAL HIGH (ref 0.0–149.0)
VLDL: 36.8 mg/dL (ref 0.0–40.0)

## 2021-08-05 LAB — HEMOGLOBIN A1C: Hgb A1c MFr Bld: 8 % — ABNORMAL HIGH (ref 4.6–6.5)

## 2021-08-05 LAB — HEPATIC FUNCTION PANEL
ALT: 15 U/L (ref 0–53)
AST: 13 U/L (ref 0–37)
Albumin: 4.4 g/dL (ref 3.5–5.2)
Alkaline Phosphatase: 80 U/L (ref 39–117)
Bilirubin, Direct: 0.1 mg/dL (ref 0.0–0.3)
Total Bilirubin: 0.6 mg/dL (ref 0.2–1.2)
Total Protein: 7.2 g/dL (ref 6.0–8.3)

## 2021-08-05 LAB — MICROALBUMIN / CREATININE URINE RATIO
Creatinine,U: 393.7 mg/dL
Microalb Creat Ratio: 2 mg/g (ref 0.0–30.0)
Microalb, Ur: 7.8 mg/dL — ABNORMAL HIGH (ref 0.0–1.9)

## 2021-08-06 LAB — VITAMIN B12: Vitamin B-12: 510 pg/mL (ref 211–911)

## 2021-08-06 LAB — TSH: TSH: 3.88 u[IU]/mL (ref 0.35–5.50)

## 2021-08-06 LAB — VITAMIN D 25 HYDROXY (VIT D DEFICIENCY, FRACTURES): VITD: 43.73 ng/mL (ref 30.00–100.00)

## 2021-08-08 ENCOUNTER — Encounter: Payer: Self-pay | Admitting: Internal Medicine

## 2021-08-08 ENCOUNTER — Ambulatory Visit (INDEPENDENT_AMBULATORY_CARE_PROVIDER_SITE_OTHER): Payer: BC Managed Care – PPO | Admitting: Internal Medicine

## 2021-08-08 VITALS — BP 118/70 | HR 67 | Temp 98.2°F | Ht 72.0 in | Wt 293.0 lb

## 2021-08-08 DIAGNOSIS — E559 Vitamin D deficiency, unspecified: Secondary | ICD-10-CM

## 2021-08-08 DIAGNOSIS — E538 Deficiency of other specified B group vitamins: Secondary | ICD-10-CM

## 2021-08-08 DIAGNOSIS — E1165 Type 2 diabetes mellitus with hyperglycemia: Secondary | ICD-10-CM | POA: Diagnosis not present

## 2021-08-08 DIAGNOSIS — N1831 Chronic kidney disease, stage 3a: Secondary | ICD-10-CM | POA: Diagnosis not present

## 2021-08-08 DIAGNOSIS — I10 Essential (primary) hypertension: Secondary | ICD-10-CM

## 2021-08-08 DIAGNOSIS — Z0001 Encounter for general adult medical examination with abnormal findings: Secondary | ICD-10-CM

## 2021-08-08 DIAGNOSIS — E7800 Pure hypercholesterolemia, unspecified: Secondary | ICD-10-CM | POA: Diagnosis not present

## 2021-08-08 DIAGNOSIS — M5090 Cervical disc disorder, unspecified, unspecified cervical region: Secondary | ICD-10-CM

## 2021-08-08 MED ORDER — EZETIMIBE 10 MG PO TABS: 10.0000 mg | ORAL_TABLET | Freq: Every day | ORAL | 3 refills | Status: DC

## 2021-08-08 MED ORDER — LISINOPRIL-HYDROCHLOROTHIAZIDE 20-12.5 MG PO TABS
2.0000 | ORAL_TABLET | Freq: Every day | ORAL | 3 refills | Status: DC
Start: 2021-08-08 — End: 2022-09-24

## 2021-08-08 MED ORDER — SITAGLIPTIN PHOSPHATE 100 MG PO TABS: 100.0000 mg | ORAL_TABLET | Freq: Every day | ORAL | 3 refills | Status: DC

## 2021-08-08 MED ORDER — GLIPIZIDE ER 10 MG PO TB24: ORAL_TABLET | ORAL | 3 refills | Status: DC

## 2021-08-08 MED ORDER — AMLODIPINE BESYLATE 2.5 MG PO TABS: 2.5000 mg | ORAL_TABLET | Freq: Every day | ORAL | 3 refills | Status: DC

## 2021-08-08 MED ORDER — ALBUTEROL SULFATE HFA 108 (90 BASE) MCG/ACT IN AERS: INHALATION_SPRAY | RESPIRATORY_TRACT | 2 refills | Status: DC

## 2021-08-08 MED ORDER — ALPRAZOLAM 0.5 MG PO TABS: 0.5000 mg | ORAL_TABLET | Freq: Two times a day (BID) | ORAL | 2 refills | Status: DC | PRN

## 2021-08-08 MED ORDER — OMEPRAZOLE 20 MG PO CPDR: 20.0000 mg | DELAYED_RELEASE_CAPSULE | Freq: Every day | ORAL | 3 refills | Status: DC

## 2021-08-08 MED ORDER — ATORVASTATIN CALCIUM 80 MG PO TABS: 80.0000 mg | ORAL_TABLET | Freq: Every day | ORAL | 3 refills | Status: DC

## 2021-08-08 MED ORDER — ATENOLOL 50 MG PO TABS: 50.0000 mg | ORAL_TABLET | Freq: Every day | ORAL | 3 refills | Status: DC

## 2021-08-08 MED ORDER — METFORMIN HCL 500 MG PO TABS: ORAL_TABLET | ORAL | 3 refills | Status: DC

## 2021-08-08 NOTE — Patient Instructions (Signed)
Please check on the ozempic vs rybeslsus, or even the [place] cost with your insurance ? ?Please continue all other medications as before, and refills have been done if requested. ? ?Please have the pharmacy call with any other refills you may need. ? ?Please continue your efforts at being more active, low cholesterol diet, and weight control. ? ?You are otherwise up to date with prevention measures today. ? ?Please keep your appointments with your specialists as you may have planned ? ?Please make an Appointment to return in 6 months, or sooner if needed, also with Lab Appointment for testing done 3-5 days before at the [place] (so this is for TWO appointments - please see the scheduling desk as you leave) ? ?Due to the ongoing Covid 19 pandemic, our lab now requires an appointment for any labs done at our office.  If you need labs done and do not have an appointment, please call our office ahead of time to schedule before presenting to the lab for your testing. ? ? ? ? ? ?

## 2021-08-08 NOTE — Progress Notes (Addendum)
Patient ID: Joseph Maxwell, male   DOB: 15-Mar-1956, 66 y.o.   MRN: [id number] ? ? ? ?     Chief Complaint:: wellness exam and dm,  ckd, low vit d and b12, htn hld ? ?     HPI:  Joseph Maxwell is a 66 y.o. male here for wellness exam; decliens covid booster, o/w up to date ?         ?              Also more stress recently with unpaid rents from tenant, unplanned expense on another property, and father died late last wk with funeral 3 days ago.  Had covid infection jan 2023, fatigue now improved but still feels "foggy" somewhat since then.  Did have a dog bit to right 5th finger about mar 6, s/p 6 days antibx after seen in Ed.  Still has some redness and swelling with layers of skin peeling, but no fever, drianage, chills.  Pt denies chest pain, increased sob or doe, wheezing, orthopnea, PND, increased LE swelling, palpitations, dizziness or syncope.   Pt denies polydipsia, polyuria, or new focal neuro s/s.   Pt denies fever, wt loss, night sweats, loss of appetite, or other constitutional symptoms  Admits to less rigoruos diet with wt flucnations.  ?  ?Wt Readings from Last 3 Encounters:  ?08/08/21 293 lb (132.9 kg)  ?02/07/21 297 lb (134.7 kg)  ?08/08/20 291 lb (132 kg)  ? ?BP Readings from Last 3 Encounters:  ?08/08/21 118/70  ?07/15/21 (!) 150/84  ?02/07/21 124/70  ? ?Immunization History  ?Administered Date(s) Administered  ? Influenza Whole 06/06/2009, 01/18/2010  ? Influenza, Seasonal, Injecte, Preservative Fre 07/23/2012  ? Influenza,inj,Quad PF,6+ Mos 01/25/2013, 01/31/2014, 01/30/2015, 01/31/2016, 01/30/2017, 02/02/2018, 02/04/2019, 02/08/2020, 02/07/2021  ? PFIZER(Purple Top)SARS-COV-2 Vaccination 06/20/2019, 07/11/2019, 02/08/2020  ? Pneumococcal Conjugate-13 07/31/2015  ? Pneumococcal Polysaccharide-23 06/10/2010, 07/31/2016  ? Td 09/06/2007  ? Tdap 02/02/2018  ? Zoster Recombinat (Shingrix) 02/08/2020, 02/08/2021  ? ?There are no preventive care reminders to display for this patient. ? ?  ? ?Past Medical  History:  ?Diagnosis Date  ? ALLERGIC RHINITIS   ? BACK PAIN   ? Cervical disc disease   ? DIABETES MELLITUS   ? DIVERTICULOSIS, COLON   ? ERECTILE DYSFUNCTION   ? FAMILIAL TREMOR   ? GERD   ? HYPERLIPIDEMIA   ? HYPERTENSION   ? OBSTRUCTIVE SLEEP APNEA   ? ONYCHOMYCOSIS, TOENAILS   ? PLANTAR FASCIITIS, LEFT   ? Unspecified asthma(493.90)   ? ?Past Surgical History:  ?Procedure Laterality Date  ? NECK SURGERY  04/08/10  ? 2 fusion and disctecomy/Dr. Wynetta Emery  ? ? reports that he has never smoked. He has never used smokeless tobacco. He reports current alcohol use. He reports that he does not use drugs. ?family history includes Diabetes in his father, mother, and sister; Heart failure in his mother; Hypertension in an other family member; Lung cancer in an other family member; Prostate cancer in his father; Sleep apnea in his mother; Stroke in his mother. ?Allergies  ?Allergen Reactions  ? Hydrocodone   ?  REACTION: anxiety reaction  ? Epinephrine Anxiety  ? ?Current Outpatient Medications on File Prior to Visit  ?Medication Sig Dispense Refill  ? aspirin 81 MG tablet Take 81 mg by mouth daily.    ? Blood Glucose Monitoring Suppl (FREESTYLE LITE) DEVI Use as directed twice per day E11.9 1 each 0  ? Cholecalciferol (VITAMIN D3) 50 MCG (2000 UT) TABS Take by  mouth.    ? [facility] VITAMIN B12 1000 MCG tablet TAKE 1 TABLET BY MOUTH EVERY DAY (Patient taking differently: Take 1,000 mcg by mouth every other day.) 90 tablet 3  ? glucose blood (FREESTYLE LITE) test strip Use to check blood sugar twice a day 200 each 5  ? Lancets (FREESTYLE) lancets Use as directed twice per day E11.9 100 each 3  ? loratadine (CLARITIN) 10 MG tablet Take 10 mg by mouth as needed.    ? niacin 500 MG tablet Take 3 tablets (1,500 mg total) by mouth daily with breakfast. 270 tablet 3  ? ?No current facility-administered medications on file prior to visit.  ? ?     ROS:  All others reviewed and negative. ? ?Objective  ? ?     PE:  BP 118/70 (BP Location:  Right Arm, Patient Position: Sitting, Cuff Size: Large)   Pulse 67   Temp 98.2 ?F (36.8 ?C) (Oral)   Ht 6' (1.829 m)   Wt 293 lb (132.9 kg)   SpO2 94%   BMI 39.74 kg/m?  ? ?              Constitutional: Pt appears in NAD ?              HENT: Head: NCAT.  ?              Right Ear: External ear normal.   ?              Left Ear: External ear normal.  ?              Eyes: . Pupils are equal, round, and reactive to light. Conjunctivae and EOM are normal ?              Nose: without d/c or deformity ?              Neck: Neck supple. Gross normal ROM ?              Cardiovascular: Normal rate and regular rhythm.   ?              Pulmonary/Chest: Effort normal and breath sounds without rales or wheezing.  ?              Abd:  Soft, NT, ND, + BS, no organomegaly ?              Neurological: Pt is alert. At baseline orientation, motor grossly intact ?              Skin: Skin is warm. No rashes, no other new lesions, LE edema - none ?              Psychiatric: Pt behavior is normal without agitation  ? ?Micro: none ? ?Cardiac tracings I have personally interpreted today:  none ? ?Pertinent Radiological findings (summarize): none  ? ?Lab Results  ?Component Value Date  ? WBC 10.9 (H) 08/05/2021  ? HGB 12.8 (L) 08/05/2021  ? HCT 38.6 (L) 08/05/2021  ? PLT 161.0 08/05/2021  ? GLUCOSE 205 (H) 08/05/2021  ? CHOL 122 08/05/2021  ? TRIG 184.0 (H) 08/05/2021  ? HDL 33.40 (L) 08/05/2021  ? LDLDIRECT 67.0 01/31/2021  ? LDLCALC 52 08/05/2021  ? ALT 15 08/05/2021  ? AST 13 08/05/2021  ? NA 141 08/05/2021  ? K 4.3 08/05/2021  ? CL 100 08/05/2021  ? CREATININE 1.30 08/05/2021  ? BUN 26 (H) 08/05/2021  ? CO2 32  08/05/2021  ? TSH 3.88 08/05/2021  ? PSA 1.24 08/01/2020  ? HGBA1C 8.0 (H) 08/05/2021  ? MICROALBUR 7.8 (H) 08/05/2021  ? ?Assessment/Plan:  ?[name] is a 66 y.o. White or Caucasian [1] male with  has a past medical history of ALLERGIC RHINITIS, BACK PAIN, Cervical disc disease, DIABETES MELLITUS, DIVERTICULOSIS, COLON,  ERECTILE DYSFUNCTION, FAMILIAL TREMOR, GERD, HYPERLIPIDEMIA, HYPERTENSION, OBSTRUCTIVE SLEEP APNEA, ONYCHOMYCOSIS, TOENAILS, PLANTAR FASCIITIS, LEFT, and Unspecified asthma(493.90). ? ?Vitamin D deficiency ?Last vitamin D ?Lab Results  ?Component Value Date  ? VD25OH 43.73 08/05/2021  ? ?Stable, cont oral replacement ? ? ?Encounter for well adult exam with abnormal findings ?Age and sex appropriate education and counseling updated with regular exercise and diet ?Referrals for preventative services - none needed ?Immunizations addressed - declines covid booster ?Smoking counseling  - none needed ?Evidence for depression or other mood disorder - none significant ?Most recent labs reviewed. ?I have personally reviewed and have noted: ?1) the patient's medical and social history ?2) The patient's current medications and supplements ?3) The patient's height, weight, and BMI have been recorded in the chart ? ? ?CKD (chronic kidney disease) ?Lab Results  ?Component Value Date  ? CREATININE 1.30 08/05/2021  ? ?Stable overall, cont to avoid nephrotoxins ? ?Hyperlipidemia ?Lab Results  ?Component Value Date  ? LDLCALC 52 08/05/2021  ? ?Stable, pt to continue current statin lipitor 80 ? ? ?Essential hypertension ?BP Readings from Last 3 Encounters:  ?08/08/21 118/70  ?07/15/21 (!) 150/84  ?02/07/21 124/70  ? ?Stable, pt to continue medical treatment norvasc, tenormin ? ? ?Diabetes (HCC) ?Lab Results  ?Component Value Date  ? HGBA1C 8.0 (H) 08/05/2021  ? ?uncontrolled, goal a1c < 7, pt to continue current medical treatment as delcines tx, though we discussed add rybelsus or farxiga ? ? ?B12 deficiency ?Lab Results  ?Component Value Date  ? [id number] 510 08/05/2021  ? ?Stable, cont oral replacement - b12 1000 mcg qd ? ?Followup: Return in about 6 months (around 02/08/2022). ? ?Oliver Barre, MD 08/10/2021 10:45 PM ?[facility] ?[facility] ?Internal Medicine ?

## 2021-08-08 NOTE — Assessment & Plan Note (Signed)
Last vitamin D ?Lab Results  ?Component Value Date  ? VD25OH 43.73 08/05/2021  ? ?Stable, cont oral replacement ? ?

## 2021-08-09 ENCOUNTER — Encounter: Payer: Self-pay | Admitting: Internal Medicine

## 2021-08-10 ENCOUNTER — Encounter: Payer: Self-pay | Admitting: Internal Medicine

## 2021-08-10 NOTE — Assessment & Plan Note (Signed)
BP Readings from Last 3 Encounters:  ?08/08/21 118/70  ?07/15/21 (!) 150/84  ?02/07/21 124/70  ? ?Stable, pt to continue medical treatment norvasc, tenormin ? ?

## 2021-08-10 NOTE — Assessment & Plan Note (Signed)
Lab Results  ?Component Value Date  ? CREATININE 1.30 08/05/2021  ? ?Stable overall, cont to avoid nephrotoxins ?

## 2021-08-10 NOTE — Assessment & Plan Note (Signed)
Lab Results  ?Component Value Date  ? HGBA1C 8.0 (H) 08/05/2021  ? ?uncontrolled, goal a1c < 7, pt to continue current medical treatment as delcines tx, though we discussed add rybelsus or farxiga ? ?

## 2021-08-10 NOTE — Assessment & Plan Note (Signed)
Lab Results  ?Component Value Date  ? [id number] 510 08/05/2021  ? ?Stable, cont oral replacement - b12 1000 mcg qd ? ?

## 2021-08-10 NOTE — Assessment & Plan Note (Signed)
Lab Results  ?Component Value Date  ? LDLCALC 52 08/05/2021  ? ?Stable, pt to continue current statin lipitor 80 ? ?

## 2021-08-10 NOTE — Assessment & Plan Note (Signed)

## 2021-08-23 ENCOUNTER — Encounter: Payer: Self-pay | Admitting: Internal Medicine

## 2021-08-26 MED ORDER — DAPAGLIFLOZIN PROPANEDIOL 10 MG PO TABS: 10.0000 mg | ORAL_TABLET | Freq: Every day | ORAL | 3 refills | Status: DC

## 2021-09-04 ENCOUNTER — Other Ambulatory Visit: Payer: Self-pay | Admitting: Internal Medicine

## 2021-09-04 NOTE — Telephone Encounter (Signed)
Please refill as per office routine med refill policy (all routine meds to be refilled for 3 mo or monthly (per pt preference) up to one year from last visit, then month to month grace period for 3 mo, then further med refills will have to be denied) ? ?

## 2021-12-28 ENCOUNTER — Other Ambulatory Visit: Payer: Self-pay | Admitting: Internal Medicine

## 2022-01-20 ENCOUNTER — Other Ambulatory Visit: Payer: Self-pay | Admitting: Internal Medicine

## 2022-02-05 ENCOUNTER — Other Ambulatory Visit (INDEPENDENT_AMBULATORY_CARE_PROVIDER_SITE_OTHER): Payer: BC Managed Care – PPO

## 2022-02-05 DIAGNOSIS — E1165 Type 2 diabetes mellitus with hyperglycemia: Secondary | ICD-10-CM

## 2022-02-05 DIAGNOSIS — E119 Type 2 diabetes mellitus without complications: Secondary | ICD-10-CM | POA: Diagnosis not present

## 2022-02-05 DIAGNOSIS — E559 Vitamin D deficiency, unspecified: Secondary | ICD-10-CM

## 2022-02-05 LAB — HEPATIC FUNCTION PANEL
ALT: 14 U/L (ref 0–53)
AST: 13 U/L (ref 0–37)
Albumin: 4.1 g/dL (ref 3.5–5.2)
Alkaline Phosphatase: 83 U/L (ref 39–117)
Bilirubin, Direct: 0.1 mg/dL (ref 0.0–0.3)
Total Bilirubin: 0.6 mg/dL (ref 0.2–1.2)
Total Protein: 7.4 g/dL (ref 6.0–8.3)

## 2022-02-05 LAB — BASIC METABOLIC PANEL
BUN: 27 mg/dL — ABNORMAL HIGH (ref 6–23)
CO2: 27 mEq/L (ref 19–32)
Calcium: 9.7 mg/dL (ref 8.4–10.5)
Chloride: 102 mEq/L (ref 96–112)
Creatinine, Ser: 1.38 mg/dL (ref 0.40–1.50)
GFR: 54 mL/min — ABNORMAL LOW (ref >60.00)
Glucose, Bld: 166 mg/dL — ABNORMAL HIGH (ref 70–99)
Potassium: 4 mEq/L (ref 3.5–5.1)
Sodium: 137 mEq/L (ref 135–145)

## 2022-02-05 LAB — LIPID PANEL
Cholesterol: 114 mg/dL (ref 0–200)
HDL: 28.8 mg/dL — ABNORMAL LOW (ref >39.00)
LDL Cholesterol: 46 mg/dL (ref 0–99)
NonHDL: 84.81
Total CHOL/HDL Ratio: 4
Triglycerides: 195 mg/dL — ABNORMAL HIGH (ref 0.0–149.0)
VLDL: 39 mg/dL (ref 0.0–40.0)

## 2022-02-05 LAB — HEMOGLOBIN A1C: Hgb A1c MFr Bld: 7.5 % — ABNORMAL HIGH (ref 4.6–6.5)

## 2022-02-05 LAB — VITAMIN D 25 HYDROXY (VIT D DEFICIENCY, FRACTURES): VITD: 47.01 ng/mL (ref 30.00–100.00)

## 2022-02-10 ENCOUNTER — Ambulatory Visit: Payer: BC Managed Care – PPO | Admitting: Internal Medicine

## 2022-02-10 ENCOUNTER — Encounter: Payer: Self-pay | Admitting: Internal Medicine

## 2022-02-10 VITALS — BP 122/68 | HR 61 | Temp 97.7°F | Ht 72.0 in | Wt 292.0 lb

## 2022-02-10 DIAGNOSIS — E538 Deficiency of other specified B group vitamins: Secondary | ICD-10-CM | POA: Diagnosis not present

## 2022-02-10 DIAGNOSIS — Z125 Encounter for screening for malignant neoplasm of prostate: Secondary | ICD-10-CM

## 2022-02-10 DIAGNOSIS — E559 Vitamin D deficiency, unspecified: Secondary | ICD-10-CM

## 2022-02-10 DIAGNOSIS — I10 Essential (primary) hypertension: Secondary | ICD-10-CM | POA: Diagnosis not present

## 2022-02-10 DIAGNOSIS — N1831 Chronic kidney disease, stage 3a: Secondary | ICD-10-CM | POA: Diagnosis not present

## 2022-02-10 DIAGNOSIS — E1165 Type 2 diabetes mellitus with hyperglycemia: Secondary | ICD-10-CM | POA: Diagnosis not present

## 2022-02-10 DIAGNOSIS — Z23 Encounter for immunization: Secondary | ICD-10-CM | POA: Diagnosis not present

## 2022-02-10 NOTE — Progress Notes (Signed)
Patient ID: Joseph Maxwell, male   DOB: 05/21/1955, 66 y.o.   MRN: [id number]        Chief Complaint: follow up HTN, HLD and hyperglycemia       HPI:  Joseph Maxwell is a 66 y.o. male here overall doing ok, Pt denies chest pain, increased sob or doe, wheezing, orthopnea, PND, increased LE swelling, palpitations, dizziness or syncope.   Pt denies polydipsia, polyuria, or new focal neuro s/s.    Pt denies fever, wt loss, night sweats, loss of appetite, or other constitutional symptoms  Plans to work to 66yo due to inflation and finances.  Has not yet decided to take rybelsus or ozempic as we discussed last visit, but will call if changes his mind.  Wt Readings from Last 3 Encounters:  02/10/22 292 lb (132.5 kg)  08/08/21 293 lb (132.9 kg)  02/07/21 297 lb (134.7 kg)   BP Readings from Last 3 Encounters:  02/10/22 122/68  08/08/21 118/70  07/15/21 (!) 150/84         Past Medical History:  Diagnosis Date   ALLERGIC RHINITIS    BACK PAIN    Cervical disc disease    DIABETES MELLITUS    DIVERTICULOSIS, COLON    ERECTILE DYSFUNCTION    FAMILIAL TREMOR    GERD    HYPERLIPIDEMIA    HYPERTENSION    OBSTRUCTIVE SLEEP APNEA    ONYCHOMYCOSIS, TOENAILS    PLANTAR FASCIITIS, LEFT    Unspecified asthma(493.90)    Past Surgical History:  Procedure Laterality Date   NECK SURGERY  04/08/10   2 fusion and disctecomy/Dr. Wynetta Emery    reports that he has never smoked. He has never used smokeless tobacco. He reports current alcohol use. He reports that he does not use drugs. family history includes Diabetes in his father, mother, and sister; Heart failure in his mother; Hypertension in an other family member; Lung cancer in an other family member; Prostate cancer in his father; Sleep apnea in his mother; Stroke in his mother. Allergies  Allergen Reactions   Hydrocodone     REACTION: anxiety reaction   Epinephrine Anxiety   Current Outpatient Medications on File Prior to Visit  Medication Sig  Dispense Refill   albuterol (VENTOLIN HFA) 108 (90 Base) MCG/ACT inhaler INHALE TWO PUFFS BY MOUTH FOUR TIMES DAILY AS NEEDED 8.5 each 2   ALPRAZolam (XANAX) 0.5 MG tablet TAKE 1 TABLET BY MOUTH TWICE A DAY AS NEEDED 60 tablet 2   amLODipine (NORVASC) 2.5 MG tablet Take 1 tablet (2.5 mg total) by mouth daily. 90 tablet 3   aspirin 81 MG tablet Take 81 mg by mouth daily.     atenolol (TENORMIN) 50 MG tablet Take 1 tablet (50 mg total) by mouth daily. 90 tablet 3   atorvastatin (LIPITOR) 80 MG tablet Take 1 tablet (80 mg total) by mouth daily. 90 tablet 3   Blood Glucose Monitoring Suppl (FREESTYLE LITE) DEVI Use as directed twice per day E11.9 1 each 0   Cholecalciferol (VITAMIN D3) 50 MCG (2000 UT) TABS Take by mouth.     [facility] VITAMIN B12 1000 MCG tablet TAKE 1 TABLET BY MOUTH EVERY DAY 90 tablet 3   dapagliflozin propanediol (FARXIGA) 10 MG TABS tablet Take 1 tablet (10 mg total) by mouth daily before breakfast. 90 tablet 3   ezetimibe (ZETIA) 10 MG tablet Take 1 tablet (10 mg total) by mouth daily. 90 tablet 3   glipiZIDE (GLUCOTROL XL) 10 MG 24 hr tablet TAKE  1 TABLET BY MOUTH EVERY DAY WITH BREAKFAST 90 tablet 3   glucose blood (FREESTYLE LITE) test strip Use to check blood sugar twice a day 200 each 5   Lancets (FREESTYLE) lancets Use as directed twice per day E11.9 100 each 3   lisinopril-hydrochlorothiazide (ZESTORETIC) 20-12.5 MG tablet Take 2 tablets by mouth daily. 180 tablet 3   loratadine (CLARITIN) 10 MG tablet Take 10 mg by mouth as needed.     metFORMIN (GLUCOPHAGE) 500 MG tablet TAKE 2 TABLETS BY MOUTH 2 (TWO) TIMES DAILY WITH A MEAL. 360 tablet 3   niacin 500 MG tablet Take 3 tablets (1,500 mg total) by mouth daily with breakfast. 270 tablet 3   omeprazole (PRILOSEC) 20 MG capsule TAKE ONE CAPSULE BY MOUTH DAILY 90 capsule 2   sitaGLIPtin (JANUVIA) 100 MG tablet Take 1 tablet (100 mg total) by mouth daily. 90 tablet 3   No current facility-administered medications on file prior  to visit.        ROS:  All others reviewed and negative.  Objective        PE:  BP 122/68 (BP Location: Right Arm, Patient Position: Sitting, Cuff Size: Large)   Pulse 61   Temp 97.7 F (36.5 C) (Oral)   Ht 6' (1.829 m)   Wt 292 lb (132.5 kg)   SpO2 92%   BMI 39.60 kg/m                 Constitutional: Pt appears in NAD               HENT: Head: NCAT.                Right Ear: External ear normal.                 Left Ear: External ear normal.                Eyes: . Pupils are equal, round, and reactive to light. Conjunctivae and EOM are normal               Nose: without d/c or deformity               Neck: Neck supple. Gross normal ROM               Cardiovascular: Normal rate and regular rhythm.                 Pulmonary/Chest: Effort normal and breath sounds without rales or wheezing.                Abd:  Soft, NT, ND, + BS, no organomegaly               Neurological: Pt is alert. At baseline orientation, motor grossly intact               Skin: Skin is warm. No rashes, no other new lesions, LE edema - none               Psychiatric: Pt behavior is normal without agitation   Micro: none  Cardiac tracings I have personally interpreted today:  none  Pertinent Radiological findings (summarize): none   Lab Results  Component Value Date   WBC 10.9 (H) 08/05/2021   HGB 12.8 (L) 08/05/2021   HCT 38.6 (L) 08/05/2021   PLT 161.0 08/05/2021   GLUCOSE 166 (H) 02/05/2022   CHOL 114 02/05/2022   TRIG 195.0 (H) 02/05/2022   HDL 28.80 (  L) 02/05/2022   LDLDIRECT 67.0 01/31/2021   LDLCALC 46 02/05/2022   ALT 14 02/05/2022   AST 13 02/05/2022   NA 137 02/05/2022   K 4.0 02/05/2022   CL 102 02/05/2022   CREATININE 1.38 02/05/2022   BUN 27 (H) 02/05/2022   CO2 27 02/05/2022   TSH 3.88 08/05/2021   PSA 1.24 08/01/2020   HGBA1C 7.5 (H) 02/05/2022   MICROALBUR 7.8 (H) 08/05/2021   Assessment/Plan:  Joseph Maxwell is a 66 y.o. White or Caucasian [1] male with  has a past  medical history of ALLERGIC RHINITIS, BACK PAIN, Cervical disc disease, DIABETES MELLITUS, DIVERTICULOSIS, COLON, ERECTILE DYSFUNCTION, FAMILIAL TREMOR, GERD, HYPERLIPIDEMIA, HYPERTENSION, OBSTRUCTIVE SLEEP APNEA, ONYCHOMYCOSIS, TOENAILS, PLANTAR FASCIITIS, LEFT, and Unspecified asthma(493.90).  CKD (chronic kidney disease) Lab Results  Component Value Date   CREATININE 1.38 02/05/2022   Stable overall, cont to avoid nephrotoxins   Essential hypertension BP Readings from Last 3 Encounters:  02/10/22 122/68  08/08/21 118/70  07/15/21 (!) 150/84   Stable, pt to continue medical treatment tenormin 50 mg qd   Diabetes ([place]) Lab Results  Component Value Date   HGBA1C 7.5 (H) 02/05/2022   Improved but still mild uncontrolled after adding farxiga last visit, pt to continue current medical treatment farxiga 10 mg, glipizide 10 mg qd, metformin 1000 bid, and januvia 100 mg qd - pt states will call if changes his mind about changing glipizide to rybelsus or ozemipc  Followup: Return in about 6 months (around 08/12/2022).  Cathlean Cower, MD 02/10/2022 9:33 AM [facility]

## 2022-02-10 NOTE — Patient Instructions (Addendum)
You had the flu shot today  Please call if you change your mind about changing the glipizide to the rybelsus or ozempic  Please continue all other medications as before, and refills have been done if requested.  Please have the pharmacy call with any other refills you may need.  Please continue your efforts at being more active, low cholesterol diet, and weight control.  Please keep your appointments with your specialists as you may have planned  Please make an Appointment to return in 6 months, or sooner if needed, also with Lab Appointment for testing done 3-5 days before at the [facility] (so this is for TWO appointments - please see the scheduling desk as you leave)

## 2022-02-10 NOTE — Assessment & Plan Note (Signed)
BP Readings from Last 3 Encounters:  02/10/22 122/68  08/08/21 118/70  07/15/21 (!) 150/84   Stable, pt to continue medical treatment tenormin 50 mg qd

## 2022-02-10 NOTE — Assessment & Plan Note (Signed)
Lab Results  Component Value Date   CREATININE 1.38 02/05/2022   Stable overall, cont to avoid nephrotoxins

## 2022-02-10 NOTE — Assessment & Plan Note (Signed)
Lab Results  Component Value Date   HGBA1C 7.5 (H) 02/05/2022   Improved but still mild uncontrolled after adding farxiga last visit, pt to continue current medical treatment farxiga 10 mg, glipizide 10 mg qd, metformin 1000 bid, and januvia 100 mg qd - pt states will call if changes his mind about changing glipizide to rybelsus or ozemipc

## 2022-04-07 ENCOUNTER — Other Ambulatory Visit: Payer: Self-pay | Admitting: Internal Medicine

## 2022-04-22 ENCOUNTER — Encounter: Payer: Self-pay | Admitting: Internal Medicine

## 2022-04-22 MED ORDER — RYBELSUS 3 MG PO TABS: 3.0000 mg | ORAL_TABLET | Freq: Every day | ORAL | 0 refills | Status: DC

## 2022-04-22 MED ORDER — RYBELSUS 7 MG PO TABS: 7.0000 mg | ORAL_TABLET | Freq: Every day | ORAL | 3 refills | Status: DC

## 2022-04-22 NOTE — Telephone Encounter (Signed)
Patient would like to replace Glipizide with Rybelsus, please advise.

## 2022-05-25 ENCOUNTER — Other Ambulatory Visit: Payer: Self-pay | Admitting: Internal Medicine

## 2022-06-05 ENCOUNTER — Encounter: Payer: Self-pay | Admitting: Internal Medicine

## 2022-06-06 ENCOUNTER — Other Ambulatory Visit (HOSPITAL_COMMUNITY): Payer: Self-pay

## 2022-06-06 NOTE — Telephone Encounter (Signed)
Pharmacy Patient Advocate Encounter  Prior Authorization for Rybelsus 7mg  has been approved.    PA# [id number] Effective dates: 06/06/2022 through 06/05/2025

## 2022-06-19 ENCOUNTER — Encounter: Payer: Self-pay | Admitting: Internal Medicine

## 2022-06-19 MED ORDER — EMPAGLIFLOZIN 25 MG PO TABS: 25.0000 mg | ORAL_TABLET | Freq: Every day | ORAL | 3 refills | Status: DC

## 2022-06-23 NOTE — Telephone Encounter (Signed)
[place] for PA - pt has DM and CKD3a

## 2022-06-24 NOTE — Telephone Encounter (Signed)
Tried to submitted PA on both meds. It states [employer] does not require prior authorization. Sent pt msg vis mychart.Marland KitchenChryl Heck

## 2022-06-26 NOTE — Telephone Encounter (Signed)
Submitted PA for [facility] [employer] w/ (Key: [id number]) PA been sent to [facility]

## 2022-06-27 ENCOUNTER — Other Ambulatory Visit (HOSPITAL_COMMUNITY): Payer: Self-pay

## 2022-06-27 NOTE — Telephone Encounter (Signed)
Rec'd determination rec's msg Your PA request has been approved.Effective 06/26/22 through 06/27/23. Sent pt msg vis mychart ../l;mb

## 2022-06-28 ENCOUNTER — Other Ambulatory Visit: Payer: Self-pay | Admitting: Internal Medicine

## 2022-06-30 ENCOUNTER — Other Ambulatory Visit (HOSPITAL_COMMUNITY): Payer: Self-pay

## 2022-07-23 ENCOUNTER — Encounter: Payer: Self-pay | Admitting: Internal Medicine

## 2022-07-29 ENCOUNTER — Other Ambulatory Visit (HOSPITAL_COMMUNITY): Payer: Self-pay

## 2022-08-05 ENCOUNTER — Other Ambulatory Visit (INDEPENDENT_AMBULATORY_CARE_PROVIDER_SITE_OTHER): Payer: 59

## 2022-08-05 DIAGNOSIS — E559 Vitamin D deficiency, unspecified: Secondary | ICD-10-CM

## 2022-08-05 DIAGNOSIS — Z125 Encounter for screening for malignant neoplasm of prostate: Secondary | ICD-10-CM | POA: Diagnosis not present

## 2022-08-05 DIAGNOSIS — E538 Deficiency of other specified B group vitamins: Secondary | ICD-10-CM

## 2022-08-05 DIAGNOSIS — E1165 Type 2 diabetes mellitus with hyperglycemia: Secondary | ICD-10-CM | POA: Diagnosis not present

## 2022-08-05 LAB — URINALYSIS, ROUTINE W REFLEX MICROSCOPIC
Hgb urine dipstick: NEGATIVE
Ketones, ur: NEGATIVE
Leukocytes,Ua: NEGATIVE
Nitrite: NEGATIVE
RBC / HPF: NONE SEEN (ref 0–?)
Specific Gravity, Urine: >=1.03 — AB (ref 1.000–1.030)
Urine Glucose: >=1000 — AB
Urobilinogen, UA: 0.2 (ref 0.0–1.0)
pH: 6 (ref 5.0–8.0)

## 2022-08-05 LAB — CBC WITH DIFFERENTIAL/PLATELET
Basophils Absolute: 0.1 K/uL (ref 0.0–0.1)
Basophils Relative: 0.7 % (ref 0.0–3.0)
Eosinophils Absolute: 0.5 K/uL (ref 0.0–0.7)
Eosinophils Relative: 4.4 % (ref 0.0–5.0)
HCT: 42.7 % (ref 39.0–52.0)
Hemoglobin: 14.1 g/dL (ref 13.0–17.0)
Lymphocytes Relative: 22.1 % (ref 12.0–46.0)
Lymphs Abs: 2.4 K/uL (ref 0.7–4.0)
MCHC: 33 g/dL (ref 30.0–36.0)
MCV: 80.1 fl (ref 78.0–100.0)
Monocytes Absolute: 1 K/uL (ref 0.1–1.0)
Monocytes Relative: 8.9 % (ref 3.0–12.0)
Neutro Abs: 6.9 K/uL (ref 1.4–7.7)
Neutrophils Relative %: 63.9 % (ref 43.0–77.0)
Platelets: 184 K/uL (ref 150.0–400.0)
RBC: 5.33 Mil/uL (ref 4.22–5.81)
RDW: 15.2 % (ref 11.5–15.5)
WBC: 10.8 K/uL — ABNORMAL HIGH (ref 4.0–10.5)

## 2022-08-05 LAB — HEPATIC FUNCTION PANEL
ALT: 13 U/L (ref 0–53)
AST: 12 U/L (ref 0–37)
Albumin: 4.3 g/dL (ref 3.5–5.2)
Alkaline Phosphatase: 108 U/L (ref 39–117)
Bilirubin, Direct: 0.1 mg/dL (ref 0.0–0.3)
Total Bilirubin: 0.7 mg/dL (ref 0.2–1.2)
Total Protein: 7.4 g/dL (ref 6.0–8.3)

## 2022-08-05 LAB — BASIC METABOLIC PANEL
BUN: 27 mg/dL — ABNORMAL HIGH (ref 6–23)
CO2: 27 mEq/L (ref 19–32)
Calcium: 9.8 mg/dL (ref 8.4–10.5)
Chloride: 103 mEq/L (ref 96–112)
Creatinine, Ser: 1.48 mg/dL (ref 0.40–1.50)
GFR: 49.48 mL/min — ABNORMAL LOW (ref >60.00)
Glucose, Bld: 198 mg/dL — ABNORMAL HIGH (ref 70–99)
Potassium: 4.7 mEq/L (ref 3.5–5.1)
Sodium: 140 mEq/L (ref 135–145)

## 2022-08-05 LAB — LIPID PANEL
Cholesterol: 127 mg/dL (ref 0–200)
HDL: 31.9 mg/dL — ABNORMAL LOW
NonHDL: 95.45
Total CHOL/HDL Ratio: 4
Triglycerides: 249 mg/dL — ABNORMAL HIGH (ref 0.0–149.0)
VLDL: 49.8 mg/dL — ABNORMAL HIGH (ref 0.0–40.0)

## 2022-08-05 LAB — HEMOGLOBIN A1C: Hgb A1c MFr Bld: 8.5 % — ABNORMAL HIGH (ref 4.6–6.5)

## 2022-08-05 LAB — MICROALBUMIN / CREATININE URINE RATIO
Creatinine,U: 235 mg/dL
Microalb Creat Ratio: 3.4 mg/g (ref 0.0–30.0)
Microalb, Ur: 7.9 mg/dL — ABNORMAL HIGH (ref 0.0–1.9)

## 2022-08-05 LAB — PSA: PSA: 1.05 ng/mL (ref 0.10–4.00)

## 2022-08-05 LAB — TSH: TSH: 2.79 u[IU]/mL (ref 0.35–5.50)

## 2022-08-05 LAB — LDL CHOLESTEROL, DIRECT: Direct LDL: 61 mg/dL

## 2022-08-05 LAB — VITAMIN B12: Vitamin B-12: 697 pg/mL (ref 211–911)

## 2022-08-05 LAB — VITAMIN D 25 HYDROXY (VIT D DEFICIENCY, FRACTURES): VITD: 38 ng/mL (ref 30.00–100.00)

## 2022-08-06 ENCOUNTER — Other Ambulatory Visit: Payer: Self-pay | Admitting: Internal Medicine

## 2022-08-12 ENCOUNTER — Ambulatory Visit: Payer: 59 | Admitting: Internal Medicine

## 2022-08-12 VITALS — BP 126/72 | HR 60 | Temp 98.1°F | Ht 72.0 in | Wt 276.0 lb

## 2022-08-12 DIAGNOSIS — E7800 Pure hypercholesterolemia, unspecified: Secondary | ICD-10-CM | POA: Diagnosis not present

## 2022-08-12 DIAGNOSIS — E1165 Type 2 diabetes mellitus with hyperglycemia: Secondary | ICD-10-CM | POA: Diagnosis not present

## 2022-08-12 DIAGNOSIS — Z23 Encounter for immunization: Secondary | ICD-10-CM

## 2022-08-12 DIAGNOSIS — Z0001 Encounter for general adult medical examination with abnormal findings: Secondary | ICD-10-CM

## 2022-08-12 DIAGNOSIS — I10 Essential (primary) hypertension: Secondary | ICD-10-CM

## 2022-08-12 DIAGNOSIS — N1831 Chronic kidney disease, stage 3a: Secondary | ICD-10-CM

## 2022-08-12 DIAGNOSIS — E559 Vitamin D deficiency, unspecified: Secondary | ICD-10-CM | POA: Diagnosis not present

## 2022-08-12 MED ORDER — DEXCOM G7 RECEIVER DEVI: 0 refills | Status: AC

## 2022-08-12 MED ORDER — RYBELSUS 14 MG PO TABS: 14.0000 mg | ORAL_TABLET | Freq: Every day | ORAL | 3 refills | Status: DC

## 2022-08-12 MED ORDER — DEXCOM G7 SENSOR MISC: 3 refills | Status: AC

## 2022-08-12 NOTE — Progress Notes (Unsigned)
Patient ID: Joseph Maxwell, male   DOB: [birth year], 67 y.o.   MRN: [id number]         Chief Complaint:: wellness exam and ckd3a, dm, htn, hd, low vit d       HPI:  Joseph Maxwell is a 67 y.o. male here for wellness exam; due for prevnar 20, declines covid booster, o/w up to date                        Also Pt denies chest pain, increased sob or doe, wheezing, orthopnea, PND, increased LE swelling, palpitations, dizziness or syncope.   Pt denies polydipsia, polyuria, or new focal neuro s/s.    Pt denies fever, wt loss, night sweats, loss of appetite, or other constitutional symptoms  Has had significant wt loss with rybelsus, hoping for more.  Admits to less acive recently, still trying to work on the diet.      Wt Readings from Last 3 Encounters:  08/12/22 276 lb (125.2 kg)  02/10/22 292 lb (132.5 kg)  08/08/21 293 lb (132.9 kg)   BP Readings from Last 3 Encounters:  08/12/22 126/72  02/10/22 122/68  08/08/21 118/70   Immunization History  Administered Date(s) Administered   Influenza Whole 06/06/2009, 01/18/2010   Influenza, Seasonal, Injecte, Preservative Fre 07/23/2012   Influenza,inj,Quad PF,6+ Mos 01/25/2013, 01/31/2014, 01/30/2015, 01/31/2016, 01/30/2017, 02/02/2018, 02/04/2019, 02/08/2020, 02/07/2021, 02/10/2022   PFIZER(Purple Top)SARS-COV-2 Vaccination 06/20/2019, 07/11/2019, 02/08/2020   PNEUMOCOCCAL CONJUGATE-20 08/12/2022   Pneumococcal Conjugate-13 07/31/2015   Pneumococcal Polysaccharide-23 06/10/2010, 07/31/2016   Td 09/06/2007   Tdap 02/02/2018   Zoster Recombinat (Shingrix) 02/08/2020, 02/08/2021  There are no preventive care reminders to display for this patient.    Past Medical History:  Diagnosis Date   ALLERGIC RHINITIS    BACK PAIN    Cervical disc disease    DIABETES MELLITUS    DIVERTICULOSIS, COLON    ERECTILE DYSFUNCTION    FAMILIAL TREMOR    GERD    HYPERLIPIDEMIA    HYPERTENSION    OBSTRUCTIVE SLEEP APNEA    ONYCHOMYCOSIS, TOENAILS    PLANTAR  FASCIITIS, LEFT    Unspecified asthma(493.90)    Past Surgical History:  Procedure Laterality Date   NECK SURGERY  04/08/10   2 fusion and disctecomy/Dr. Saintclair Halsted    reports that he has never smoked. He has never used smokeless tobacco. He reports current alcohol use. He reports that he does not use drugs. family history includes Diabetes in his father, mother, and sister; Heart failure in his mother; Hypertension in an other family member; Lung cancer in an other family member; Prostate cancer in his father; Sleep apnea in his mother; Stroke in his mother. Allergies  Allergen Reactions   Hydrocodone     REACTION: anxiety reaction   Epinephrine Anxiety   Current Outpatient Medications on File Prior to Visit  Medication Sig Dispense Refill   albuterol (VENTOLIN HFA) 108 (90 Base) MCG/ACT inhaler INHALE TWO PUFFS BY MOUTH FOUR TIMES DAILY AS NEEDED 8.5 each 2   ALPRAZolam (XANAX) 0.5 MG tablet TAKE 1 TABLET BY MOUTH TWICE A DAY AS NEEDED 60 tablet 2   amLODipine (NORVASC) 2.5 MG tablet Take 1 tablet (2.5 mg total) by mouth daily. 90 tablet 3   aspirin 81 MG tablet Take 81 mg by mouth daily.     atenolol (TENORMIN) 50 MG tablet Take 1 tablet (50 mg total) by mouth daily. 90 tablet 3   atorvastatin (LIPITOR) 80 MG tablet  TAKE 1 TABLET BY MOUTH EVERY DAY 90 tablet 3   Blood Glucose Monitoring Suppl (FREESTYLE LITE) DEVI Use as directed twice per day E11.9 1 each 0   Cholecalciferol (VITAMIN D3) 50 MCG (2000 UT) TABS Take by mouth.     [facility] VITAMIN B12 1000 MCG tablet TAKE 1 TABLET BY MOUTH EVERY DAY 90 tablet 3   dapagliflozin propanediol (FARXIGA) 10 MG TABS tablet Take 1 tablet (10 mg total) by mouth daily before breakfast. 90 tablet 3   ezetimibe (ZETIA) 10 MG tablet Take 1 tablet (10 mg total) by mouth daily. 90 tablet 3   glucose blood (FREESTYLE LITE) test strip Use to check blood sugar twice a day 200 each 5   Lancets (FREESTYLE) lancets Use as directed twice per day E11.9 100 each 3    lisinopril-hydrochlorothiazide (ZESTORETIC) 20-12.5 MG tablet Take 2 tablets by mouth daily. 180 tablet 3   loratadine (CLARITIN) 10 MG tablet Take 10 mg by mouth as needed.     metFORMIN (GLUCOPHAGE) 500 MG tablet TAKE 2 TABLETS BY MOUTH 2 (TWO) TIMES DAILY WITH A MEAL. 360 tablet 3   niacin 500 MG tablet Take 3 tablets (1,500 mg total) by mouth daily with breakfast. 270 tablet 3   omeprazole (PRILOSEC) 20 MG capsule TAKE ONE CAPSULE BY MOUTH DAILY 90 capsule 2   sitaGLIPtin (JANUVIA) 100 MG tablet Take 1 tablet (100 mg total) by mouth daily. 90 tablet 3   No current facility-administered medications on file prior to visit.        ROS:  All others reviewed and negative.  Objective        PE:  BP 126/72   Pulse 60   Temp 98.1 F (36.7 C) (Oral)   Ht 6' (1.829 m)   Wt 276 lb (125.2 kg)   SpO2 93%   BMI 37.43 kg/m                 Constitutional: Pt appears in NAD               HENT: Head: NCAT.                Right Ear: External ear normal.                 Left Ear: External ear normal.                Eyes: . Pupils are equal, round, and reactive to light. Conjunctivae and EOM are normal               Nose: without d/c or deformity               Neck: Neck supple. Gross normal ROM               Cardiovascular: Normal rate and regular rhythm.                 Pulmonary/Chest: Effort normal and breath sounds without rales or wheezing.                Abd:  Soft, NT, ND, + BS, no organomegaly               Neurological: Pt is alert. At baseline orientation, motor grossly intact               Skin: Skin is warm. No rashes, no other new lesions, LE edema - none  Psychiatric: Pt behavior is normal without agitation   Micro: none  Cardiac tracings I have personally interpreted today:  none  Pertinent Radiological findings (summarize): none   Lab Results  Component Value Date   WBC 10.8 (H) 08/05/2022   HGB 14.1 08/05/2022   HCT 42.7 08/05/2022   PLT 184.0 08/05/2022    GLUCOSE 198 (H) 08/05/2022   CHOL 127 08/05/2022   TRIG 249.0 (H) 08/05/2022   HDL 31.90 (L) 08/05/2022   LDLDIRECT 61.0 08/05/2022   LDLCALC 46 02/05/2022   ALT 13 08/05/2022   AST 12 08/05/2022   NA 140 08/05/2022   K 4.7 08/05/2022   CL 103 08/05/2022   CREATININE 1.48 08/05/2022   BUN 27 (H) 08/05/2022   CO2 27 08/05/2022   TSH 2.79 08/05/2022   PSA 1.05 08/05/2022   HGBA1C 8.5 (H) 08/05/2022   MICROALBUR 7.9 (H) 08/05/2022   Assessment/Plan:  Joseph Maxwell is a 67 y.o. White or Caucasian [1] male with  has a past medical history of ALLERGIC RHINITIS, BACK PAIN, Cervical disc disease, DIABETES MELLITUS, DIVERTICULOSIS, COLON, ERECTILE DYSFUNCTION, FAMILIAL TREMOR, GERD, HYPERLIPIDEMIA, HYPERTENSION, OBSTRUCTIVE SLEEP APNEA, ONYCHOMYCOSIS, TOENAILS, PLANTAR FASCIITIS, LEFT, and Unspecified asthma(493.90).  Encounter for well adult exam with abnormal findings Age and sex appropriate education and counseling updated with regular exercise and diet Referrals for preventative services - none needed Immunizations addressed - for prevnar 20 today, declines covid booster Smoking counseling  - none needed Evidence for depression or other mood disorder - none significant Most recent labs reviewed. I have personally reviewed and have noted: 1) the patient's medical and social history 2) The patient's current medications and supplements 3) The patient's height, weight, and BMI have been recorded in the chart   CKD (chronic kidney disease) Lab Results  Component Value Date   CREATININE 1.48 08/05/2022   Stable overall, cont to avoid nephrotoxins  Diabetes ([place]) Lab Results  Component Value Date   HGBA1C 8.5 (H) 08/05/2022   Uncontrolled, for increased rybelsus 14 mg, pt to continue current medical treatment metfomrin 500 mg bid, farxiga 10 qd, januvia 100 qd   Essential hypertension BP Readings from Last 3 Encounters:  08/12/22 126/72  02/10/22 122/68  08/08/21 118/70    Stable, pt to continue medical treatment norvasc 2.5 mg qd, tenormin 50 qd, zestoretic 20-12.5 qd   Hyperlipidemia Lab Results  Component Value Date   LDLCALC 46 02/05/2022   Stable, pt to continue current statin zetia 10 mg qd, lipitor 80 qd   Vitamin D deficiency Last vitamin D Lab Results  Component Value Date   VD25OH 38.00 08/05/2022   Low, to start cont oral replacement  Followup: Return in about 6 months (around 02/11/2023).  Cathlean Cower, MD 08/14/2022 8:35 PM [facility]

## 2022-08-12 NOTE — Patient Instructions (Signed)
You had the Prevnar 20 pneumonia shot today  You would need the Pneumovax for the last time in 1 yr from now  [facility] to increase the Rybelsus to 14 mg per day  I have [facility] the Rx [facility]com G[facility] sensors [facility]  Please continue all other medications as before, and refills have been done if requested.  Please have the pharmacy call with any other refills you may need.  Please continue your efforts at being more active, low cholesterol diet, and weight control.  You are otherwise up to date with prevention measures today.  Please keep your appointments with your specialists as you may have planned  Your lab work was otherwise good today  Please make an Appointment to return in 6 months, or sooner if needed, also with Lab Appointment for testing done 3-5 days before at the [facility] (so this is for TWO appointments - please see the scheduling desk as you leave)

## 2022-08-14 ENCOUNTER — Encounter: Payer: Self-pay | Admitting: Internal Medicine

## 2022-08-14 NOTE — Assessment & Plan Note (Signed)
Lab Results  Component Value Date   CREATININE 1.48 08/05/2022   Stable overall, cont to avoid nephrotoxins

## 2022-08-14 NOTE — Assessment & Plan Note (Signed)
Lab Results  Component Value Date   HGBA1C 8.5 (H) 08/05/2022   Uncontrolled, for increased rybelsus 14 mg, pt to continue current medical treatment metfomrin 500 mg bid, farxiga 10 qd, januvia 100 qd

## 2022-08-14 NOTE — Assessment & Plan Note (Signed)
Lab Results  Component Value Date   LDLCALC 46 02/05/2022   Stable, pt to continue current statin zetia 10 mg qd, lipitor 80 qd

## 2022-08-14 NOTE — Assessment & Plan Note (Signed)
BP Readings from Last 3 Encounters:  08/12/22 126/72  02/10/22 122/68  08/08/21 118/70   Stable, pt to continue medical treatment norvasc 2.5 mg qd, tenormin 50 qd, zestoretic 20-12.5 qd

## 2022-08-14 NOTE — Assessment & Plan Note (Signed)
Last vitamin D Lab Results  Component Value Date   VD25OH 38.00 08/05/2022   Low, to start cont oral replacement

## 2022-08-14 NOTE — Assessment & Plan Note (Signed)
Age and sex appropriate education and counseling updated with regular exercise and diet Referrals for preventative services - none needed Immunizations addressed - for prevnar 20 today, declines covid booster Smoking counseling  - none needed Evidence for depression or other mood disorder - none significant Most recent labs reviewed. I have personally reviewed and have noted: 1) the patient's medical and social history 2) The patient's current medications and supplements 3) The patient's height, weight, and BMI have been recorded in the chart

## 2022-08-23 ENCOUNTER — Other Ambulatory Visit: Payer: Self-pay | Admitting: Internal Medicine

## 2022-08-24 ENCOUNTER — Other Ambulatory Visit: Payer: Self-pay | Admitting: Internal Medicine

## 2022-09-03 ENCOUNTER — Other Ambulatory Visit: Payer: Self-pay | Admitting: Internal Medicine

## 2022-09-06 ENCOUNTER — Encounter: Payer: Self-pay | Admitting: Internal Medicine

## 2022-09-08 ENCOUNTER — Other Ambulatory Visit: Payer: Self-pay

## 2022-09-10 ENCOUNTER — Other Ambulatory Visit: Payer: Self-pay | Admitting: Internal Medicine

## 2022-09-13 ENCOUNTER — Encounter: Payer: Self-pay | Admitting: Internal Medicine

## 2022-09-15 ENCOUNTER — Other Ambulatory Visit: Payer: Self-pay

## 2022-09-24 ENCOUNTER — Other Ambulatory Visit: Payer: Self-pay

## 2022-09-24 ENCOUNTER — Other Ambulatory Visit: Payer: Self-pay | Admitting: Internal Medicine

## 2022-10-15 ENCOUNTER — Other Ambulatory Visit: Payer: Self-pay

## 2022-10-15 ENCOUNTER — Other Ambulatory Visit: Payer: Self-pay | Admitting: Internal Medicine

## 2022-10-30 ENCOUNTER — Other Ambulatory Visit: Payer: Self-pay | Admitting: Internal Medicine

## 2022-10-30 ENCOUNTER — Other Ambulatory Visit: Payer: Self-pay

## 2022-11-29 ENCOUNTER — Other Ambulatory Visit: Payer: Self-pay | Admitting: Internal Medicine

## 2023-02-06 ENCOUNTER — Other Ambulatory Visit (INDEPENDENT_AMBULATORY_CARE_PROVIDER_SITE_OTHER): Payer: No Typology Code available for payment source

## 2023-02-06 DIAGNOSIS — E559 Vitamin D deficiency, unspecified: Secondary | ICD-10-CM

## 2023-02-06 DIAGNOSIS — E1165 Type 2 diabetes mellitus with hyperglycemia: Secondary | ICD-10-CM

## 2023-02-06 LAB — BASIC METABOLIC PANEL
BUN: 32 mg/dL — ABNORMAL HIGH (ref 6–23)
CO2: 25 meq/L (ref 19–32)
Calcium: 9.7 mg/dL (ref 8.4–10.5)
Chloride: 102 meq/L (ref 96–112)
Creatinine, Ser: 1.51 mg/dL — ABNORMAL HIGH (ref 0.40–1.50)
GFR: 48.13 mL/min — ABNORMAL LOW (ref >60.00)
Glucose, Bld: 119 mg/dL — ABNORMAL HIGH (ref 70–99)
Potassium: 4.3 meq/L (ref 3.5–5.1)
Sodium: 138 meq/L (ref 135–145)

## 2023-02-06 LAB — HEPATIC FUNCTION PANEL
ALT: 17 U/L (ref 0–53)
AST: 15 U/L (ref 0–37)
Albumin: 4.4 g/dL (ref 3.5–5.2)
Alkaline Phosphatase: 91 U/L (ref 39–117)
Bilirubin, Direct: 0.1 mg/dL (ref 0.0–0.3)
Total Bilirubin: 0.9 mg/dL (ref 0.2–1.2)
Total Protein: 7.5 g/dL (ref 6.0–8.3)

## 2023-02-06 LAB — HEMOGLOBIN A1C: Hgb A1c MFr Bld: 6.7 % — ABNORMAL HIGH (ref 4.6–6.5)

## 2023-02-06 LAB — VITAMIN D 25 HYDROXY (VIT D DEFICIENCY, FRACTURES): VITD: 61.13 ng/mL (ref 30.00–100.00)

## 2023-02-06 LAB — LIPID PANEL
Cholesterol: 91 mg/dL (ref 0–200)
HDL: 29.1 mg/dL — ABNORMAL LOW (ref >39.00)
LDL Cholesterol: 21 mg/dL (ref 0–99)
NonHDL: 62.07
Total CHOL/HDL Ratio: 3
Triglycerides: 206 mg/dL — ABNORMAL HIGH (ref 0.0–149.0)
VLDL: 41.2 mg/dL — ABNORMAL HIGH (ref 0.0–40.0)

## 2023-02-11 ENCOUNTER — Encounter: Payer: Self-pay | Admitting: Internal Medicine

## 2023-02-11 ENCOUNTER — Ambulatory Visit (INDEPENDENT_AMBULATORY_CARE_PROVIDER_SITE_OTHER): Payer: No Typology Code available for payment source | Admitting: Internal Medicine

## 2023-02-11 VITALS — BP 110/60 | HR 74 | Temp 98.0°F | Ht 72.0 in | Wt 254.2 lb

## 2023-02-11 DIAGNOSIS — E1165 Type 2 diabetes mellitus with hyperglycemia: Secondary | ICD-10-CM | POA: Diagnosis not present

## 2023-02-11 DIAGNOSIS — Z125 Encounter for screening for malignant neoplasm of prostate: Secondary | ICD-10-CM

## 2023-02-11 DIAGNOSIS — N1831 Chronic kidney disease, stage 3a: Secondary | ICD-10-CM | POA: Diagnosis not present

## 2023-02-11 DIAGNOSIS — R9431 Abnormal electrocardiogram [ECG] [EKG]: Secondary | ICD-10-CM

## 2023-02-11 DIAGNOSIS — I10 Essential (primary) hypertension: Secondary | ICD-10-CM

## 2023-02-11 DIAGNOSIS — E7800 Pure hypercholesterolemia, unspecified: Secondary | ICD-10-CM

## 2023-02-11 DIAGNOSIS — E538 Deficiency of other specified B group vitamins: Secondary | ICD-10-CM | POA: Diagnosis not present

## 2023-02-11 DIAGNOSIS — Z7984 Long term (current) use of oral hypoglycemic drugs: Secondary | ICD-10-CM

## 2023-02-11 DIAGNOSIS — E559 Vitamin D deficiency, unspecified: Secondary | ICD-10-CM

## 2023-02-11 DIAGNOSIS — Z23 Encounter for immunization: Secondary | ICD-10-CM

## 2023-02-11 NOTE — Patient Instructions (Signed)
You had the flu shot today  Please continue all other medications as before, and refills have been done if requested.  Please have the pharmacy call with any other refills you may need.  Please continue your efforts at being more active, low cholesterol diet, and weight control.  Please keep your appointments with your specialists as you may have planned - your eye exam next week  You will be contacted regarding the referral for: Cardiac CT score  Please make an Appointment to return in 6 months, or sooner if needed, also with Lab Appointment for testing done 3-5 days before at the [facility] (so this is for TWO appointments - please see the scheduling desk as you leave)

## 2023-02-11 NOTE — Progress Notes (Signed)
Patient ID: Joseph Maxwell, male   DOB: 21-Jan-1956, 67 y.o.   MRN: [id number]        Chief Complaint: follow up HTN, HLD and dm, low b12, low vit d, ckd3a       HPI:  Joseph Maxwell is a 67 y.o. male here overall doing ok, Pt denies chest pain, increased sob or doe, wheezing, orthopnea, PND, increased LE swelling, palpitations, dizziness or syncope.   Pt denies polydipsia, polyuria, or new focal neuro s/s.    Pt denies fever, wt loss, night sweats, loss of appetite, or other constitutional symptoms  Eye exam due next wk  [place] for card CT score.   Wt Readings from Last 3 Encounters:  02/11/23 254 lb 3.2 oz (115.3 kg)  08/12/22 276 lb (125.2 kg)  02/10/22 292 lb (132.5 kg)   BP Readings from Last 3 Encounters:  02/11/23 110/60  08/12/22 126/72  02/10/22 122/68         Past Medical History:  Diagnosis Date   ALLERGIC RHINITIS    BACK PAIN    Cervical disc disease    DIABETES MELLITUS    DIVERTICULOSIS, COLON    ERECTILE DYSFUNCTION    FAMILIAL TREMOR    GERD    HYPERLIPIDEMIA    HYPERTENSION    OBSTRUCTIVE SLEEP APNEA    ONYCHOMYCOSIS, TOENAILS    PLANTAR FASCIITIS, LEFT    Unspecified asthma(493.90)    Past Surgical History:  Procedure Laterality Date   NECK SURGERY  04/08/10   2 fusion and disctecomy/Dr. Wynetta Emery    reports that he has never smoked. He has never used smokeless tobacco. He reports current alcohol use. He reports that he does not use drugs. family history includes Chronic Renal Failure in his sister; Diabetes in his father, mother, and sister; Heart failure in his mother; Hypertension in an other family member; Lung cancer in an other family member; Prostate cancer in his father; Sleep apnea in his mother; Stroke in his mother. Allergies  Allergen Reactions   Hydrocodone     REACTION: anxiety reaction   Epinephrine Anxiety   Current Outpatient Medications on File Prior to Visit  Medication Sig Dispense Refill   albuterol (VENTOLIN HFA) 108 (90 Base) MCG/ACT  inhaler INHALE TWO PUFFS BY MOUTH FOUR TIMES DAILY AS NEEDED 8.5 each 2   ALPRAZolam (XANAX) 0.5 MG tablet TAKE 1 TABLET BY MOUTH TWICE A DAY AS NEEDED 60 tablet 2   amLODipine (NORVASC) 2.5 MG tablet TAKE 1 TABLET BY MOUTH EVERY DAY 90 tablet 3   aspirin 81 MG tablet Take 81 mg by mouth daily.     atenolol (TENORMIN) 50 MG tablet TAKE 1 TABLET BY MOUTH EVERY DAY 90 tablet 3   atorvastatin (LIPITOR) 80 MG tablet TAKE 1 TABLET BY MOUTH EVERY DAY 90 tablet 3   Blood Glucose Monitoring Suppl (FREESTYLE LITE) DEVI Use as directed twice per day E11.9 1 each 0   Cholecalciferol (VITAMIN D3) 50 MCG (2000 UT) TABS Take by mouth.     Continuous Blood Gluc Receiver (DEXCOM G7 RECEIVER) DEVI Use as directed once daily E11.9 1 each 0   Continuous Blood Gluc Sensor (DEXCOM G7 SENSOR) MISC Use as directed once every 14 days E11.9 6 each 3   [facility] VITAMIN B12 1000 MCG tablet TAKE 1 TABLET BY MOUTH EVERY DAY 90 tablet 3   ezetimibe (ZETIA) 10 MG tablet TAKE 1 TABLET BY MOUTH EVERY DAY 90 tablet 3   FARXIGA 10 MG TABS tablet TAKE 1  TABLET BY MOUTH DAILY BEFORE BREAKFAST. 90 tablet 3   glucose blood (FREESTYLE LITE) test strip Use to check blood sugar twice a day 200 each 5   JANUVIA 100 MG tablet TAKE 1 TABLET BY MOUTH EVERY DAY 90 tablet 3   Lancets (FREESTYLE) lancets Use as directed twice per day E11.9 100 each 3   lisinopril-hydrochlorothiazide (ZESTORETIC) 20-12.5 MG tablet TAKE 2 TABLETS BY MOUTH DAILY 180 tablet 3   loratadine (CLARITIN) 10 MG tablet Take 10 mg by mouth as needed.     metFORMIN (GLUCOPHAGE) 500 MG tablet TAKE 2 TABLETS BY MOUTH 2 (TWO) TIMES DAILY WITH A MEAL. 360 tablet 3   niacin 500 MG tablet Take 3 tablets (1,500 mg total) by mouth daily with breakfast. 270 tablet 3   omeprazole (PRILOSEC) 20 MG capsule TAKE ONE CAPSULE BY MOUTH DAILY 90 capsule 2   Semaglutide (RYBELSUS) 14 MG TABS Take 1 tablet (14 mg total) by mouth daily. 90 tablet 3   No current facility-administered medications  on file prior to visit.        ROS:  All others reviewed and negative.  Objective        PE:  BP 110/60 (BP Location: Left Arm, Patient Position: Sitting, Cuff Size: Normal)   Pulse 74   Temp 98 F (36.7 C) (Oral)   Ht 6' (1.829 m)   Wt 254 lb 3.2 oz (115.3 kg)   SpO2 96%   BMI 34.48 kg/m                 Constitutional: Pt appears in NAD               HENT: Head: NCAT.                Right Ear: External ear normal.                 Left Ear: External ear normal.                Eyes: . Pupils are equal, round, and reactive to light. Conjunctivae and EOM are normal               Nose: without d/c or deformity               Neck: Neck supple. Gross normal ROM               Cardiovascular: Normal rate and regular rhythm.                 Pulmonary/Chest: Effort normal and breath sounds without rales or wheezing.                Abd:  Soft, NT, ND, + BS, no organomegaly               Neurological: Pt is alert. At baseline orientation, motor grossly intact               Skin: Skin is warm. No rashes, no other new lesions, LE edema - none               Psychiatric: Pt behavior is normal without agitation   Micro: none  Cardiac tracings I have personally interpreted today:  none  Pertinent Radiological findings (summarize): none   Lab Results  Component Value Date   WBC 10.8 (H) 08/05/2022   HGB 14.1 08/05/2022   HCT 42.7 08/05/2022   PLT 184.0 08/05/2022   GLUCOSE 119 (H) 02/06/2023  CHOL 91 02/06/2023   TRIG 206.0 (H) 02/06/2023   HDL 29.10 (L) 02/06/2023   LDLDIRECT 61.0 08/05/2022   LDLCALC 21 02/06/2023   ALT 17 02/06/2023   AST 15 02/06/2023   NA 138 02/06/2023   K 4.3 02/06/2023   CL 102 02/06/2023   CREATININE 1.51 (H) 02/06/2023   BUN 32 (H) 02/06/2023   CO2 25 02/06/2023   TSH 2.79 08/05/2022   PSA 1.05 08/05/2022   HGBA1C 6.7 (H) 02/06/2023   MICROALBUR 7.9 (H) 08/05/2022   Assessment/Plan:  Joseph Maxwell is a 67 y.o. White or Caucasian [1] male with   has a past medical history of ALLERGIC RHINITIS, BACK PAIN, Cervical disc disease, DIABETES MELLITUS, DIVERTICULOSIS, COLON, ERECTILE DYSFUNCTION, FAMILIAL TREMOR, GERD, HYPERLIPIDEMIA, HYPERTENSION, OBSTRUCTIVE SLEEP APNEA, ONYCHOMYCOSIS, TOENAILS, PLANTAR FASCIITIS, LEFT, and Unspecified asthma(493.90).  CKD (chronic kidney disease) Lab Results  Component Value Date   CREATININE 1.51 (H) 02/06/2023   Stable overall, cont to avoid nephrotoxins   Diabetes ([facility]) Lab Results  Component Value Date   HGBA1C 6.7 (H) 02/06/2023   Stable, pt to continue current medical treatment farxiga 10 every day, januvia 100 every day, metformin 1000 bid, rybelsus 14 qd   Hyperlipidemia Lab Results  Component Value Date   LDLCALC 21 02/06/2023   Stable, pt to continue current statin zetia 10 every day, lipitor 80 every day, also for card Ct score   Essential hypertension BP Readings from Last 3 Encounters:  02/11/23 110/60  08/12/22 126/72  02/10/22 122/68   Stable, pt to continue medical treatment zestoretic 20 12.5 every day, norvasc 2.5 every day, tenormin 50 qd   B12 deficiency Lab Results  Component Value Date   VITAMINB12 697 08/05/2022   Stable, cont oral replacement - b12 1000 mcg qd   Vitamin D deficiency Last vitamin D Lab Results  Component Value Date   VD25OH 61.13 02/06/2023   Stable, cont oral replacement  Followup: Return in about 6 months (around 08/12/2023).  Oliver Barre, MD 02/15/2023 1:57 PM [facility]

## 2023-02-15 ENCOUNTER — Encounter: Payer: Self-pay | Admitting: Internal Medicine

## 2023-02-15 NOTE — Assessment & Plan Note (Signed)
Last vitamin D Lab Results  Component Value Date   VD25OH 61.13 02/06/2023   Stable, cont oral replacement

## 2023-02-15 NOTE — Assessment & Plan Note (Addendum)
Lab Results  Component Value Date   LDLCALC 21 02/06/2023   Stable, pt to continue current statin zetia 10 every day, lipitor 80 every day, also for card Ct score

## 2023-02-15 NOTE — Assessment & Plan Note (Signed)
Lab Results  Component Value Date   CREATININE 1.51 (H) 02/06/2023   Stable overall, cont to avoid nephrotoxins

## 2023-02-15 NOTE — Assessment & Plan Note (Signed)
BP Readings from Last 3 Encounters:  02/11/23 110/60  08/12/22 126/72  02/10/22 122/68   Stable, pt to continue medical treatment zestoretic 20 12.5 every day, norvasc 2.5 every day, tenormin 50 qd

## 2023-02-15 NOTE — Assessment & Plan Note (Signed)
Lab Results  Component Value Date   VITAMINB12 697 08/05/2022   Stable, cont oral replacement - b12 1000 mcg qd

## 2023-02-15 NOTE — Assessment & Plan Note (Signed)
Lab Results  Component Value Date   HGBA1C 6.7 (H) 02/06/2023   Stable, pt to continue current medical treatment farxiga 10 every day, januvia 100 every day, metformin 1000 bid, rybelsus 14 qd

## 2023-03-05 ENCOUNTER — Other Ambulatory Visit: Payer: Self-pay

## 2023-03-05 ENCOUNTER — Other Ambulatory Visit: Payer: Self-pay | Admitting: Internal Medicine

## 2023-03-20 ENCOUNTER — Ambulatory Visit (HOSPITAL_BASED_OUTPATIENT_CLINIC_OR_DEPARTMENT_OTHER)
Admission: RE | Admit: 2023-03-20 | Discharge: 2023-03-20 | Disposition: A | Discharge status: Home / Self Care | Payer: No Typology Code available for payment source | Source: Ambulatory Visit | Attending: Internal Medicine | Admitting: Internal Medicine

## 2023-03-20 DIAGNOSIS — I10 Essential (primary) hypertension: Secondary | ICD-10-CM | POA: Insufficient documentation

## 2023-03-20 DIAGNOSIS — R9431 Abnormal electrocardiogram [ECG] [EKG]: Secondary | ICD-10-CM | POA: Insufficient documentation

## 2023-03-20 DIAGNOSIS — E7800 Pure hypercholesterolemia, unspecified: Secondary | ICD-10-CM | POA: Insufficient documentation

## 2023-03-20 DIAGNOSIS — E1165 Type 2 diabetes mellitus with hyperglycemia: Secondary | ICD-10-CM | POA: Insufficient documentation

## 2023-03-22 ENCOUNTER — Other Ambulatory Visit: Payer: Self-pay | Admitting: Internal Medicine

## 2023-03-22 DIAGNOSIS — R931 Abnormal findings on diagnostic imaging of heart and coronary circulation: Secondary | ICD-10-CM

## 2023-04-08 ENCOUNTER — Other Ambulatory Visit: Payer: Self-pay | Admitting: Internal Medicine

## 2023-04-08 DIAGNOSIS — R931 Abnormal findings on diagnostic imaging of heart and coronary circulation: Secondary | ICD-10-CM

## 2023-04-11 ENCOUNTER — Other Ambulatory Visit: Payer: Self-pay | Admitting: Internal Medicine

## 2023-04-13 ENCOUNTER — Encounter: Payer: Self-pay | Admitting: Internal Medicine

## 2023-04-13 ENCOUNTER — Other Ambulatory Visit: Payer: Self-pay

## 2023-04-21 LAB — HM DIABETES EYE EXAM

## 2023-05-01 ENCOUNTER — Encounter: Payer: Self-pay | Admitting: Internal Medicine

## 2023-05-01 NOTE — Telephone Encounter (Signed)
 Care team updated and letter sent for eye exam notes.

## 2023-05-11 NOTE — Progress Notes (Signed)
 Cardiology Office Note:  .   Date:  05/21/2023 ID:  Joseph Maxwell, DOB 02/08/1956, MRN [id number] PCP: Norleen Lynwood ORN, MD [facility] Providers Cardiologist:  None   Patient Profile: .      PMH Type 2 DM Coronary artery disease CT Calcium  score 03/20/23 CAC score 1474 (95th percentile) LM 86.5, LAD 547, LCx 259, RCA 581 Hyperlipidemia Hypertension OSA Obesity Vitamin B12 deficiency CKD       History of Present Illness: .   Joseph Maxwell is a very pleasant 67 y.o. male  who is here today for new patient consult for elevated coronary calcium  score of 1474 (95th percentile).  He reports CT was ordered for risk stratification. He reports that his lifestyle is largely sedentary, with minimal physical activity beyond occasional dog walking and household chores. Was recently started on Rybelsus  which has helped curb his appetite. Diet consists mostly of home-cooked meals, with a conscious effort to limit sugar intake due to diabetes. Strong family history of CHF in his mother's family. 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 Is a volunteer with [employer] and admits that recently he has noted some activity intolerance with hiking up an elevation. Has some chest discomfort and lightheadedness, especially when bending over. He denies chest pain with exertion, palpitations, orthopnea, PND, edema, presyncope, syncope.  Admits that he rarely drinks water, mostly tea and frequently eats sausage biscuits for breakfast. He has recently tried to reduce his intake of snack foods.    Family history: His family history includes Chronic Renal Failure in his sister; Diabetes in his father, mother, and sister; Heart failure in his mother; Hypertension in an other family member; Lung cancer in an other family member; Prostate cancer in his father; Sleep apnea in his mother; Stroke in his mother.  Valve replacement in  mother, CHF Maternal GF - CHF, 4-5 out of 10 also had issues  ASCVD Risk Score:  ASCVD (Atherosclerotic Cardiovascular Disease) Risk Algorithm including Known ASCVD from AHA/ACC from [email or web address]  on 05/11/2023 ** All calculations should be rechecked by clinician prior to use **  RESULT SUMMARY: 17.8 % Risk of cardiovascular event (coronary or stroke death or non-fatal MI or stroke) in next 10 years.  High-intensity statin recommended because of known diabetes and 10-year risk >7.5%.   If LDL 70mg /dl (8.18 mmol/L), additional factors like lifestyle and risk-benefits can be considered before starting statins  To view statin dosages by intensity, see Evidence section.  INPUTS: History of ASCVD --> 0 = No LDL Cholesterol >=190mg /dL (5.07 mmol/L) --> 0 = No Age --> 67 years Diabetes --> 1 = Yes Sex --> 1 = Male Total Cholesterol --> 100 mg/dL HDL Cholesterol --> 70.8 mg/dL Systolic Blood Pressure --> 110 mm Hg Treatment for Hypertension --> 1 = Yes Smoker --> 0 = No Race --> 1 = White   Diet: Loves breakfast food, often has sausage/egg mcmuffin, sausage biscuit  Eats most meals at home Likes salads Prefers steamed veggies over cooked  Used to eat a lot snacks Unsweet tea or tea with artificial sweetener on occasion Diet soda   Activity: No regular exercise Occasional yard work and outdoor activities with [employer]  ROS: See HPI       Studies Reviewed: SABRA   EKG Interpretation Date/Time:  Wednesday May 20 2023 14:00:20 EST Ventricular Rate:  75 PR Interval:  206 QRS Duration:  92 QT  Interval:  382 QTC Calculation: 426 R Axis:   0  Text Interpretation: Normal sinus rhythm Normal ECG No previous ECGs available No ST abnormality Confirmed by Joseph Maxwell [phone]) on 05/20/2023 2:14:51 PM      Risk Assessment/Calculations:             Physical Exam:   VS: BP 118/64   Pulse 75   Ht 6' (1.829 m)   Wt 261 lb 8 oz (118.6 kg)   SpO2 94%   BMI 35.47 kg/m    Wt Readings from Last 3 Encounters:  05/20/23 261 lb 8 oz (118.6 kg)  02/11/23 254 lb 3.2 oz (115.3 kg)  08/12/22 276 lb (125.2 kg)     GEN: Obese, well developed in no acute distress NECK: No JVD; No carotid bruits CARDIAC: RRR, no murmurs, rubs, gallops RESPIRATORY:  Clear to auscultation without rales, wheezing or rhonchi  ABDOMEN: Soft, non-tender, non-distended EXTREMITIES:  No edema; No deformity     ASSESSMENT AND PLAN: .    CAD: Elevated coronary artery calcium  score of 1474, placing him at 95th percentile to age/sex matched controls. EKG today does not reveal acute ST abnormality. He denies chest pain with exertion. Admits to activity intolerance in the form of shortness of breath with hiking within the last year and lightheadedness and chest discomfort when bending forward. Admits he is mostly sedentary with no regular exercise. We will get a coronary CTA for ischemia evaluation. Will have him take metoprolol  50 mg in addition to his usual dose of atenolol  2 hours apart. LDL cholesterol is well controlled. Continue aspirin, ezetimibe , Farxiga , Zestoretic , atenolol  and atorvastatin .   Hyperlipidemia LDL goal < 70: Lipid panel 02/06/23 with total cholesterol 91, triglycerides 206, HDL 29.10, LDL 21. Lipids are well controlled. Continue atorvastatin  and ezetimibe . Focus on heart healthy mostly plant based diet avoiding saturated fat, processed foods, simple carbohydrates, and sugar along with aiming for at least 150 minutes of moderate intensity exercise each week.   CV Risk Assessment: Elevated ASCVD risk score 17.8% secondary to age, history of diabetes, hyperlipidemia, and hypertension. We discussed the pillars of CV risk and aiming for optimal control of each of these. We are getting coronary CTA to evaluate for ischemia. BP and lipids are well controlled. Most recent A1C 6.7%. He is working on dietary improvements to achieve weight loss. He is already on asa. Will await CT for  further risk stratification.   Diabetes: A1C 6.7% on 02/06/23. He is on Rybelsus  which he feels is helping him control his appetite. We discussed heart healthy diet which will likely also improve A1C. Management per PCP.    Plan/Goals: 1: Increase moderate intensity exercise to achieve 150 minutes each week 2: Resume weight lifting in addition to cardiovascular exercise 3: Avoid saturated fat, sugar, simple carbohydrates and processed or fried foods        Dispo: 2-3 months with me  Signed, Maxwell Percy, NP-C

## 2023-05-20 ENCOUNTER — Ambulatory Visit (HOSPITAL_BASED_OUTPATIENT_CLINIC_OR_DEPARTMENT_OTHER): Payer: Managed Care, Other (non HMO) | Admitting: Nurse Practitioner

## 2023-05-20 ENCOUNTER — Encounter (HOSPITAL_BASED_OUTPATIENT_CLINIC_OR_DEPARTMENT_OTHER): Payer: Self-pay | Admitting: Nurse Practitioner

## 2023-05-20 VITALS — BP 118/64 | HR 75 | Ht 72.0 in | Wt 261.5 lb

## 2023-05-20 DIAGNOSIS — Z7189 Other specified counseling: Secondary | ICD-10-CM | POA: Diagnosis not present

## 2023-05-20 DIAGNOSIS — E1165 Type 2 diabetes mellitus with hyperglycemia: Secondary | ICD-10-CM

## 2023-05-20 DIAGNOSIS — E785 Hyperlipidemia, unspecified: Secondary | ICD-10-CM | POA: Diagnosis not present

## 2023-05-20 DIAGNOSIS — I2510 Atherosclerotic heart disease of native coronary artery without angina pectoris: Secondary | ICD-10-CM | POA: Diagnosis not present

## 2023-05-20 DIAGNOSIS — I10 Essential (primary) hypertension: Secondary | ICD-10-CM | POA: Diagnosis not present

## 2023-05-20 DIAGNOSIS — I2583 Coronary atherosclerosis due to lipid rich plaque: Secondary | ICD-10-CM

## 2023-05-20 MED ORDER — METOPROLOL TARTRATE 50 MG PO TABS: ORAL_TABLET | ORAL | 0 refills | Status: DC

## 2023-05-20 NOTE — Patient Instructions (Signed)
 Medication Instructions:   Your physician recommends that you continue on your current medications as directed. Please refer to the Current Medication list given to you today.   *If you need a refill on your cardiac medications before your next appointment, please call your pharmacy*   Lab Work:  TODAY!!! BMET  If you have labs (blood work) drawn today and your tests are completely normal, you will receive your results only by: MyChart Message (if you have MyChart) OR A paper copy in the mail If you have any lab test that is abnormal or we need to change your treatment, we will call you to review the results.   Testing/Procedures:    Your cardiac CT will be scheduled at one of the below locations:   [facility] [address] [place], [place] [address] [phone]    If scheduled at [facility], please arrive at the [facility] and Children's Entrance (Entrance C2) of [facility] 30 minutes prior to test start time. You can use the FREE valet parking offered at entrance C (encouraged to control the heart rate for the test)  Proceed to the [facility] Radiology Department (first floor) to check-in and test prep.  All radiology patients and guests should use entrance C2 at [facility], accessed from [facility], even though the hospital's physical address listed is [address]     Please follow these instructions carefully (unless otherwise directed):  An IV will be required for this test and Nitroglycerin  will be given.  Hold all erectile dysfunction medications at least 3 days (72 hrs) prior to test. (Ie viagra, cialis, sildenafil, tadalafil, etc)   On the Night Before the Test: Be sure to Drink plenty of water. Do not consume any caffeinated/decaffeinated beverages or chocolate 12 hours prior to your test. Do not take any antihistamines 12 hours prior to your test.   On the Day of the Test: Drink plenty of  water until 1 hour prior to the test. Do not eat any food 1 hour prior to test. You may take your regular medications prior to the test.  Take metoprolol  (Lopressor ) one tablet ( 50 mg) by mouth two hours prior to test. If you take Furosemide/Hydrochlorothiazide /Spironolactone/Chlorthalidone, please HOLD on the morning of the test. Patients who wear a continuous glucose monitor MUST remove the device prior to scanning.      After the Test: Drink plenty of water. After receiving IV contrast, you may experience a mild flushed feeling. This is normal. On occasion, you may experience a mild rash up to 24 hours after the test. This is not dangerous. If this occurs, you can take Benadryl 25 mg and increase your fluid intake. If you experience trouble breathing, this can be serious. If it is severe call 911 IMMEDIATELY. If it is mild, please call our office.  We will call to schedule your test 2-4 weeks out understanding that some insurance companies will need an authorization prior to the service being performed.   For more information and frequently asked questions, please visit our website : [email or web address]  For non-scheduling related questions, please contact the cardiac imaging nurse navigator should you have any questions/concerns: Cardiac Imaging Nurse Navigators Direct Office Dial: [phone]   For scheduling needs, including cancellations and rescheduling, please call Brittany, [phone].    Follow-Up: At [facility], you and your health needs are our priority.  As part of our continuing mission to provide you with exceptional heart  care, we have created designated Provider Care Teams.  These Care Teams include your primary Cardiologist (physician) and Advanced Practice Providers (APPs -  Physician Assistants and Nurse Practitioners) who all work together to provide you with the care you need, when you need it.  We recommend signing up for the patient  portal called MyChart.  Sign up information is provided on this After Visit Summary.  MyChart is used to connect with patients for Virtual Visits (Telemedicine).  Patients are able to view lab/test results, encounter notes, upcoming appointments, etc.  Non-urgent messages can be sent to your provider as well.   To learn more about what you can do with MyChart, go to [email or web address].    Your next appointment:   3 month(s)  Provider:   Rosaline Bane, NP    Other Instructions  Tackling Obesity with Lifestyle Changes  Obesity- What is it? And What can we do about it?  Obesity is a chronic complex disease defined as excessive fat deposits that can have a negative effect on our health. It can lead to many other diseases including type 2 diabetes.  Weight gain occurs when the amount of energy (calories) we consume is greater than the amount we use.  When our energy output is greater than our energy input we lose weight. The basic concept is simple, but in reality, it's much more complicated.  Unfortunately, in some people, our bodies have many ways it can compensate when we try to eat less and move more which can prevent us  from changing our weight. This can lead to some people having a much more difficult time losing weight even when they put healthy habits into practice. This can be frustrating. We want to focus on healthy habits, physical activity and how we feel, and less the number on the scale.  Food As Energy  Calories  Calories is just a unit of measurement for energy.  Counting calories is not required to lose weight but counting for a short period of time can:   help you learn good portion sizes   Learn what your true energy needs are.   Help you be more aware of your snacking or grazing habits  To help calculate how many calories you should be eating, the NIH has a great body weight planner calculator at beveragebuggy.si  Types of Energy  Expenditure  Basal Metabolic Rate (BMR) Energy that our bodies use to preform everyday tasks. More muscle mass through resistance training can increase this a small amount  Thermic Effect of Food The amount of energy that it takes to breakdown the food we eat. This will be highest when we eat protein and fiber rich foods  Exercise Energy Expenditure The amount of energy used during formal exercise (walking, biking, weightlifting)  Non-exercise activity thermogenesis (NEAT) The amount of energy spent on activities that are not formal exercise (standing, fidgeting). Therefore, it is not only important to do formal exercise but also move around throughout the day.  Managing The Meal  Macro nutrients (carbohydrates, fats and protein, fiber, water)  Micronutrients (vitamins, minerals)  Dietary Fiber  Benefits Examples Cautions  Soluble fiber  Decreases cholesterol  improve blood sugar control,  Feeds our gut bacteria  Allows us  to feel fuller for longer so we eat less  fruits  oats  barley  legumes  peas  Beans  vegetables (broccoli) and root vegetables (carrots) Add fiber into your diet slowly and be sure to drink at least 8 cups of water a  day. This will help limit gas, bloating, diarrhea, or constipation.  Insoluble Fiber  Improves digestive health by making stool easier to pass  Allows us  to feel fuller for longer so we eat less  whole grains  nuts  seeds  skin of fruit  vegetables (green beans, zucchini, cauliflower)  Tricks to add more fiber to your diet   Add beans (pinto, kidney, lima, navy and garbanzo) to salads, ground meat or brown rice   Add nuts or seeds and or fresh/frozen fruit to yogurt, cottage cheese, salads or steel cut oats   Cut up vegetables and eat with hummus   Look for unsweetened whole grain cereals with at least 5g of fiber per serving   Switch to whole grain bread. Look for bread that has whole grain flour as the first ingredient and  has more fiber than carbs if you were to multiple the fiber x 10.   Try bulgar, barely, quinoa, buckwheat, brown rice wild rice instead of white rice   Keep frozen vegetables on hand to add to dishes or soups  Meal Planning:  Meal planning is the key to setting you up for success. Here are some examples of healthy meal options.  Breakfast  Option 1: Omelette with vegetables (1 egg, spinach, mushrooms, or other vegetable of your choice), 2 slices whole-grain toast, tip of thumb size butter or soft margarine,  cup low-fat milk or yogurt  Option 2: steel-cut rolled oats (? cup dry), 1 tbsp peanut butter added to cooked oats,  cup low-fat milk.  Option 3: 2 slices whole-grain or rye toast with avocado spread ( small avocado mased with herbs and pepper to taste), 1 poached egg or sunnyside up (cooked to your liking)  Option 4:  cup plain 0% Greek yogurt topped with  cup berries and  cup walnuts or almonds, 2 slices whole-grain or rye toast, tip of thumb size soft margarine/butter  Lunch:  Option 1: 2 cups red lentil soup, green salad with 1 tbsp homemade vinaigrette (extra virgin olive oil and vinegar of choice plus spices)  Option 2: 3 oz. roasted chicken, 2 slices whole-grain bread, 2 tsp mayonnaise, mustard, lettuce, tomato if desired, 1 fruit (example: medium-sized apple or small pear)  Option 3: 3 oz. tuna packed in water, 1 whole-wheat pita (6 inch), 2 tsp mayonnaise, lettuce, tomato, or other non-starchy vegetable of your choice, 1 fruit (example: medium-sized apple or small pear)  Option 4: 1 serving of garden veggie buddha bowl with lentils and tahini sauce and 1 cup berries topped with  cup plain 0% Greek yogurt  Dinner:  Option 1: 1 serving roasted cauliflower salad, 3-4 oz. grilled or baked pork loin chop, 1/2 cup mashed potato, or brown rice or quinoa  Option 2: 1 serving fish (baked, grilled or air fried), green salad, 1 tbsp homemade vinaigrette,  cup cooked  couscous  Option 3: 1 cup cooked whole grained pasta (example: spaghetti, spirals, macaroni),  cup favorite pasta sauce (preferably homemade), 3-4 oz. grilled or baked chicken, green salad, 1 tbsp homemade vinaigrette  Option 4: 1 serving oven roasted salmon,  cup mashed sweet potato or couscous or brown rice or quinoa, broccoli (steamed or roasted)  Healthy snacks:   Carrots or celery with 1 tbsp of hummus   1 medium-sized fruit (apple or orange)   1 cup plain 0% Greek yogurt with  cup berries   Half apple, sliced, with 1 tbsp (15 mL) peanut or almond butter  Dining out:  Eating away from home has become a part of many people's lifestyle. Making healthy choices when you are eating out is important too. Portion size is an important part of healthy choices. Most branded fast-food places provide calories, sodium, and fat content for their menu items. www.calorieking.com would be great resource to find nutrition facts for your favorite brands and fast-food restaurants. Company specific website can be chief technology officer for nutrition information for their items. (e.g. www.mcdonalds.com or www.nutritionix.com/[place]/menu/premium)  Here are some tips to help you make wise food choices when you are dining out.  Chose more often Avoid  Beverages   Choose more often: Water, low fat milk  Sugar-free/diet drinks  Unsweet tea or coffee    Avoid: Milkshakes, fruit drinks, regular pop  Alcohol, specialty drinks (e.g. iced cappuccino)  Fast food  Choose more often:  Garden salad  Mini subs, pita sandwiches ect with extra vegetables  plain burgers, grilled chicken  Vegetarian or cheese pizza with whole-grain crust    Avoid: Burgers/sandwiches with bacon, cheese, and high-fat sauces  French fries, fried chicken, fried fish, poutine, hash browns  Pizza with processed meats  Starters   Choose more often: Raw vegetables, salads (garden, spinach, fruit)  clear or vegetable soups   Seafood cocktail  Whole-grain breads and rolls    Avoid: Salads with high-fat dressings or toppings  Creamy soups  Wings, egg rolls  onion rings, nachos  White or garlic bread  Main courses Grains & Starches (amount equal to  of your plate)  Choose more often:  Oatmeal, high-fiber/lower-sugar cereals  Whole-grain breads, rice, pasta, barley, couscous  Sweet potatoes    Avoid: Sugary, low-fiber cereals  Large bagels, muffins, croissants, white bread  French fries, hash browns, fried rice   Meat and alternative (amount equal to  of your plate)  Choose more often:  Lean meats, poultry, fish, eggs, low-fat cheese  Tofu, vegetable protein Legumes (e.g. lentils, chickpeas, beans)    Avoid: High-salt and/or high-fat meats (e.g. ribs, wings, sausages, wieners, processed lunch meats, imposter meats)   Vegetables (amount equal to  of your plate)  Choose more often:  Salads (Greek, garden, spinach), plain vegetables   Avoid:  Salads with creamy, high-fat dressings and   Vegetables on sandwiches ect toppings like bacon bits, croutons, cheese  Desserts  Choose more often:  Fresh fruit, frozen yogourt, skim milk latte    Avoid: Cakes, pies, pastries, ice cream, cheesecake      Adopting a Healthy Lifestyle.   Weight: Know what a healthy weight is for you (roughly BMI <25) and aim to maintain this. You can calculate your body mass index on your smart phone. Unfortunately, this is not the most accurate measure of healthy weight, but it is the simplest measurement to use. A more accurate measurement involves body scanning which measures lean muscle, fat tissue and bony density. We do not have this equipment at [facility].    Diet: Aim for 7+ servings of fruits and vegetables daily Limit animal fats in diet for cholesterol and heart health - choose grass fed whenever available Avoid highly processed foods (fast food burgers, tacos, fried chicken, pizza, hot dogs, french fries)   Saturated fat comes in the form of butter, lard, coconut oil, margarine, partially hydrogenated oils, and fat in meat. These increase your risk of cardiovascular disease.  Use healthy plant oils, such as olive, canola, soy, corn, sunflower and peanut.  Whole foods such as fruits, vegetables and whole grains have fiber  Men need >  38 grams of fiber per day Women need > 25 grams of fiber per day  Load up on vegetables and fruits - one-half of your plate: Aim for color and variety, and remember that potatoes dont count. Go for whole grains - one-quarter of your plate: Whole wheat, barley, wheat berries, quinoa, oats, brown rice, and foods made with them. If you want pasta, go with whole wheat pasta. Protein power - one-quarter of your plate: Fish, chicken, beans, and nuts are all healthy, versatile protein sources. Limit red meat. You need carbohydrates for energy! The type of carbohydrate is more important than the amount. Choose carbohydrates such as vegetables, fruits, whole grains, beans, and nuts in the place of white rice, white pasta, potatoes (baked or fried), macaroni and cheese, cakes, cookies, and donuts.  If youre thirsty, drink water. Coffee and tea are good in moderation, but skip sugary drinks and limit milk and dairy products to one or two daily servings. Keep sugar intake at 6 teaspoons or 24 grams or LESS       Exercise: Aim for 150 min of moderate intensity exercise weekly for heart health, and weights twice weekly for bone health Stay active - any steps are better than no steps! Aim for 7-9 hours of sleep daily

## 2023-05-21 ENCOUNTER — Encounter (HOSPITAL_BASED_OUTPATIENT_CLINIC_OR_DEPARTMENT_OTHER): Payer: Self-pay | Admitting: Nurse Practitioner

## 2023-05-21 ENCOUNTER — Telehealth: Payer: Self-pay

## 2023-05-21 DIAGNOSIS — Z01812 Encounter for preprocedural laboratory examination: Secondary | ICD-10-CM

## 2023-05-21 LAB — BASIC METABOLIC PANEL
BUN/Creatinine Ratio: 22 (ref 10–24)
BUN: 33 mg/dL — ABNORMAL HIGH (ref 8–27)
CO2: 19 mmol/L — ABNORMAL LOW (ref 20–29)
Calcium: 9.2 mg/dL (ref 8.6–10.2)
Chloride: 103 mmol/L (ref 96–106)
Creatinine, Ser: 1.52 mg/dL — ABNORMAL HIGH (ref 0.76–1.27)
Glucose: 198 mg/dL — ABNORMAL HIGH (ref 70–99)
Potassium: 4.3 mmol/L (ref 3.5–5.2)
Sodium: 141 mmol/L (ref 134–144)
eGFR: 51 mL/min/{1.73_m2} — ABNORMAL LOW (ref >59)

## 2023-05-21 NOTE — Telephone Encounter (Signed)
-----   Message from Percy Rosaline HERO sent at 05/21/2023  8:15 AM EST ----- Creatinine is elevated > 1.5. We will not be able to get CT unless this improves. Would recommend that he work on consuming at least 48 ounces of water daily and return for repeat bmet in 1 week. If creatinine remains elevated, we will get cardiac PET CT for evaluation of ischemia.

## 2023-05-21 NOTE — Telephone Encounter (Signed)
 Spoke with patient and discussed lab results.  Per Rosaline Bane, NP: Creatinine is elevated > 1.5. We will not be able to get CT unless this improves. Would recommend that he work on consuming at least 48 ounces of water daily and return for repeat bmet in 1 week. If creatinine remains elevated, we will get cardiac PET CT for evaluation of ischemia.   BMET ordered, patient will return for lab draw next week around 1/15 or 1/16 to recheck creatinine.  Patient verbalized understanding of the above.

## 2023-05-29 ENCOUNTER — Encounter (HOSPITAL_COMMUNITY): Payer: Self-pay

## 2023-05-29 LAB — BASIC METABOLIC PANEL
BUN/Creatinine Ratio: 19 (ref 10–24)
BUN: 27 mg/dL (ref 8–27)
CO2: 22 mmol/L (ref 20–29)
Calcium: 10.4 mg/dL — ABNORMAL HIGH (ref 8.6–10.2)
Chloride: 100 mmol/L (ref 96–106)
Creatinine, Ser: 1.39 mg/dL — ABNORMAL HIGH (ref 0.76–1.27)
Glucose: 100 mg/dL — ABNORMAL HIGH (ref 70–99)
Potassium: 4.8 mmol/L (ref 3.5–5.2)
Sodium: 139 mmol/L (ref 134–144)
eGFR: 56 mL/min/{1.73_m2} — ABNORMAL LOW (ref >59)

## 2023-06-02 ENCOUNTER — Ambulatory Visit (HOSPITAL_COMMUNITY)
Admission: RE | Admit: 2023-06-02 | Discharge: 2023-06-02 | Disposition: A | Discharge status: Home / Self Care | Payer: Managed Care, Other (non HMO) | Source: Ambulatory Visit | Attending: Nurse Practitioner | Admitting: Nurse Practitioner

## 2023-06-02 DIAGNOSIS — R931 Abnormal findings on diagnostic imaging of heart and coronary circulation: Secondary | ICD-10-CM | POA: Insufficient documentation

## 2023-06-02 DIAGNOSIS — I2583 Coronary atherosclerosis due to lipid rich plaque: Secondary | ICD-10-CM | POA: Insufficient documentation

## 2023-06-02 DIAGNOSIS — E785 Hyperlipidemia, unspecified: Secondary | ICD-10-CM | POA: Insufficient documentation

## 2023-06-02 DIAGNOSIS — I2510 Atherosclerotic heart disease of native coronary artery without angina pectoris: Secondary | ICD-10-CM | POA: Insufficient documentation

## 2023-06-02 MED ORDER — NITROGLYCERIN 0.4 MG SL SUBL
0.8000 mg | SUBLINGUAL_TABLET | Freq: Once | SUBLINGUAL | Status: AC
  Administered 2023-06-02: 0.8 mg via SUBLINGUAL

## 2023-06-02 MED ORDER — IOHEXOL 350 MG/ML SOLN
95.0000 mL | Freq: Once | INTRAVENOUS | Status: AC | PRN
  Administered 2023-06-02: 95 mL via INTRAVENOUS

## 2023-06-02 MED ORDER — NITROGLYCERIN 0.4 MG SL SUBL
SUBLINGUAL_TABLET | SUBLINGUAL | Status: AC
  Filled 2023-06-02: qty 2

## 2023-06-03 ENCOUNTER — Other Ambulatory Visit: Payer: Self-pay | Admitting: Cardiology

## 2023-06-03 ENCOUNTER — Ambulatory Visit (HOSPITAL_BASED_OUTPATIENT_CLINIC_OR_DEPARTMENT_OTHER)
Admission: RE | Admit: 2023-06-03 | Discharge: 2023-06-03 | Disposition: A | Discharge status: Home / Self Care | Payer: Managed Care, Other (non HMO) | Source: Ambulatory Visit | Attending: Cardiology | Admitting: Cardiology

## 2023-06-03 DIAGNOSIS — I2510 Atherosclerotic heart disease of native coronary artery without angina pectoris: Secondary | ICD-10-CM | POA: Diagnosis not present

## 2023-06-03 DIAGNOSIS — R931 Abnormal findings on diagnostic imaging of heart and coronary circulation: Secondary | ICD-10-CM

## 2023-06-04 ENCOUNTER — Telehealth: Payer: Self-pay | Admitting: Nurse Practitioner

## 2023-06-04 MED ORDER — METOPROLOL SUCCINATE ER 50 MG PO TB24: 50.0000 mg | ORAL_TABLET | Freq: Every day | ORAL | 3 refills | Status: DC

## 2023-06-04 NOTE — Telephone Encounter (Signed)
Sent pt mychart message with upcoming appt with Dr. Jacinto Halim internationalist to discuss cath.

## 2023-06-04 NOTE — Telephone Encounter (Signed)
Called patient to review coronary CTA results which shows severe flow-limiting stenosis in the mid LAD and mid RCA. I advised that cardiac catheterization is recommended.  He reports he continues to feel well and has not had any symptoms concerning for angina.  We had a lengthy discussion about the risk/benefits of cardiac cath versus medical management. He would like to try medical management and then return to the office soon for follow-up.  Advised that we will try to get him on the schedule of one of the interventional cardiologist so that if cardiac catheterization is recommended, he will be able to coordinate with that provider pending schedules are compatible. ER precautions advised.   I advised him to call if he has concerns prior to next appointment. I changed his atenolol to metoprolol 50 mg daily.   Advised that we will be in touch later today with a follow-up appointment.

## 2023-06-09 ENCOUNTER — Other Ambulatory Visit: Payer: Self-pay | Admitting: Internal Medicine

## 2023-06-11 ENCOUNTER — Telehealth: Payer: Self-pay | Admitting: Internal Medicine

## 2023-06-11 ENCOUNTER — Telehealth: Payer: Self-pay

## 2023-06-11 NOTE — Telephone Encounter (Unsigned)
Copied from CRM [id number]. Topic: Clinical - Prescription Issue >> Jun 11, 2023 10:49 AM Truddie Crumble wrote: Reason for CRM: patient called stating the pharmacy denied his farxiga medication. Patient stated his wife insurance has changed and he may need a prior authorization on farxiga and  RYBELSUS. The patient is out of his farxiga

## 2023-06-11 NOTE — Telephone Encounter (Signed)
Message has been sent to PA team.

## 2023-06-12 ENCOUNTER — Telehealth: Payer: Self-pay

## 2023-06-12 ENCOUNTER — Other Ambulatory Visit (HOSPITAL_COMMUNITY): Payer: Self-pay

## 2023-06-12 ENCOUNTER — Encounter: Payer: Self-pay | Admitting: Internal Medicine

## 2023-06-12 NOTE — Telephone Encounter (Signed)
Pharmacy Patient Advocate Encounter  Received notification from [employer] that Prior Authorization for Farxiga 10MG  tablets  has been APPROVED from 06/12/23 to 05/11/2098. Unable to obtain price due to refill too soon rejection, last fill date 06/12/23 next available fill date4/15/25   PA #/Case ID/Reference #:  [id number]

## 2023-06-12 NOTE — Telephone Encounter (Signed)
Pharmacy Patient Advocate Encounter   Received notification from Pt Calls Messages that prior authorization for Rybelsus 14MG  tablets is required/requested.   Insurance verification completed.   The patient is insured through [employer] .   Per test claim: PA required; PA submitted to above mentioned insurance via CoverMyMeds Key/confirmation #/EOC [id number] Status is pending

## 2023-06-12 NOTE — Telephone Encounter (Signed)
PA requests have been Submitted. New Encounters created for follow up. For additional info see Pharmacy Prior Auth telephone encounters from 06/12/23.

## 2023-06-12 NOTE — Telephone Encounter (Signed)
Pharmacy Patient Advocate Encounter   Received notification from Pt Calls Messages that prior authorization for Farxiga 10MG  tablets is required/requested.   Insurance verification completed.   The patient is insured through [employer] .   Per test claim: PA required; PA submitted to above mentioned insurance via CoverMyMeds Key/confirmation #/EOC [id number] Status is pending

## 2023-06-13 ENCOUNTER — Encounter (HOSPITAL_BASED_OUTPATIENT_CLINIC_OR_DEPARTMENT_OTHER): Payer: Self-pay

## 2023-06-16 ENCOUNTER — Ambulatory Visit: Payer: Managed Care, Other (non HMO) | Attending: Cardiology | Admitting: Cardiology

## 2023-06-16 ENCOUNTER — Encounter: Payer: Self-pay | Admitting: Cardiology

## 2023-06-16 VITALS — BP 132/69 | HR 73 | Resp 14 | Ht 72.0 in | Wt 262.0 lb

## 2023-06-16 DIAGNOSIS — E785 Hyperlipidemia, unspecified: Secondary | ICD-10-CM

## 2023-06-16 DIAGNOSIS — I10 Essential (primary) hypertension: Secondary | ICD-10-CM

## 2023-06-16 DIAGNOSIS — E119 Type 2 diabetes mellitus without complications: Secondary | ICD-10-CM

## 2023-06-16 DIAGNOSIS — I2510 Atherosclerotic heart disease of native coronary artery without angina pectoris: Secondary | ICD-10-CM | POA: Diagnosis not present

## 2023-06-16 NOTE — Patient Instructions (Signed)
Medication Instructions:  Your physician recommends that you continue on your current medications as directed. Please refer to the Current Medication list given to you today.  *If you need a refill on your cardiac medications before your next appointment, please call your pharmacy*   Lab Work: none If you have labs (blood work) drawn today and your tests are completely normal, you will receive your results only by: MyChart Message (if you have MyChart) OR A paper copy in the mail If you have any lab test that is abnormal or we need to change your treatment, we will call you to review the results.   Testing/Procedures: none   Follow-Up: At [facility], you and your health needs are our priority.  As part of our continuing mission to provide you with exceptional heart care, we have created designated Provider Care Teams.  These Care Teams include your primary Cardiologist (physician) and Advanced Practice Providers (APPs -  Physician Assistants and Nurse Practitioners) who all work together to provide you with the care you need, when you need it.  We recommend signing up for the patient portal called "MyChart".  Sign up information is provided on this After Visit Summary.  MyChart is used to connect with patients for Virtual Visits (Telemedicine).  Patients are able to view lab/test results, encounter notes, upcoming appointments, etc.  Non-urgent messages can be sent to your provider as well.   To learn more about what you can do with MyChart, go to [email or web address].    Your next appointment:   April 9  Provider:   Eligha Bridegroom, NP         Other Instructions

## 2023-06-16 NOTE — Progress Notes (Signed)
 Cardiology Office Note:  .   Date:  06/16/2023  ID:  Joseph Maxwell, DOB [birth year], MRN [id number] PCP: Norleen Lynwood ORN, MD  [facility] Providers Cardiologist:  Gordy Bergamo, MD   History of Present Illness: .   Joseph Maxwell is a 68 y.o. Caucasian male patient with hypercholesterolemia, primary hypertension, diabetes mellitus with stage IIIa chronic kidney disease initially referred to us  on 05/20/2023 for elevated coronary calcium  score in the 95th percentile, underwent coronary CT angiogram and found to have LAD and RCA high-grade stenosis, patient now presents for discussions regarding options of therapy and possible coronary angiography.  Discussed the use of AI scribe software for clinical note transcription with the patient, who gave verbal consent to proceed.  History of Present Illness   Joseph Maxwell is a 68 year old male with coronary artery disease who presents for evaluation and discussion of heart catheterization. He was referred by Dr. Lynwood Norleen following a coronary calcium  score in the 95th percentile and a CT angiogram showing blockages.  Except for chronic dyspnea he remains asymptomatic with no current chest pain or leg swelling is reported.  He experiences shortness of breath, particularly during activities like hiking on camping trips with changing elevations, requiring him to stop several times. Lightheadedness occurs when bending over and standing back up, such as during showers. He notes a decrease in his ability to work as hard as before but does not push himself to the point of having to stop completely. 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   He has a history of sleep apnea but reports losing about fifty pounds and has not used a CPAP machine for the past year. His wife notes no apneic  episodes during the night. He experiences some tossing and turning at night but does not wake up huffing or puffing.       Labs   Lab Results  Component Value Date   CHOL 91 02/06/2023   HDL 29.10 (L) 02/06/2023   LDLCALC 21 02/06/2023   LDLDIRECT 61.0 08/05/2022   TRIG 206.0 (H) 02/06/2023   CHOLHDL 3 02/06/2023   Lab Results  Component Value Date   NA 139 05/28/2023   K 4.8 05/28/2023   CO2 22 05/28/2023   GLUCOSE 100 (H) 05/28/2023   BUN 27 05/28/2023   CREATININE 1.39 (H) 05/28/2023   CALCIUM  10.4 (H) 05/28/2023   GFR 48.13 (L) 02/06/2023   EGFR 56 (L) 05/28/2023   GFRNONAA >60 04/02/2010      Latest Ref Rng & Units 05/28/2023    1:51 PM 05/20/2023    3:20 PM 02/06/2023    7:51 AM  BMP  Glucose 70 - 99 mg/dL 899  801  880   BUN 8 - 27 mg/dL 27  33  32   Creatinine 0.76 - 1.27 mg/dL 8.60  8.47  8.48   BUN/Creat Ratio 10 - 24 19  22     Sodium 134 - 144 mmol/L 139  141  138   Potassium 3.5 - 5.2 mmol/L 4.8  4.3  4.3   Chloride 96 - 106 mmol/L 100  103  102   CO2 20 - 29 mmol/L 22  19  25    Calcium  8.6 - 10.2 mg/dL 89.5  9.2  9.7       Latest Ref Rng & Units  08/05/2022    7:48 AM 08/05/2021    7:45 AM 08/01/2020    7:39 AM  CBC  WBC 4.0 - 10.5 K/uL 10.8  10.9  11.5   Hemoglobin 13.0 - 17.0 g/dL 85.8  87.1  87.1   Hematocrit 39.0 - 52.0 % 42.7  38.6  38.8   Platelets 150.0 - 400.0 K/uL 184.0  161.0  200.0    Review of Systems  Constitutional: Positive for weight loss (on Rybelsus ).  Cardiovascular:  Positive for dyspnea on exertion. Negative for chest pain and leg swelling.  Respiratory:  Positive for snoring.    Physical Exam:   VS:  BP 132/69 (BP Location: Left Arm, Patient Position: Sitting, Cuff Size: Normal)   Pulse 73   Resp 14   Ht 6' (1.829 m)   Wt 262 lb (118.8 kg)   SpO2 97%   BMI 35.53 kg/m    Wt Readings from Last 3 Encounters:  06/16/23 262 lb (118.8 kg)  05/20/23 261 lb 8 oz (118.6 kg)  02/11/23 254 lb 3.2 oz (115.3 kg)    Physical  Exam Constitutional:      Appearance: He is obese.  Neck:     Vascular: No carotid bruit or JVD.  Cardiovascular:     Rate and Rhythm: Normal rate and regular rhythm.     Pulses: Intact distal pulses.     Heart sounds: Normal heart sounds. No murmur heard.    No gallop.  Pulmonary:     Effort: Pulmonary effort is normal.     Breath sounds: Normal breath sounds.  Abdominal:     General: Bowel sounds are normal.     Palpations: Abdomen is soft.  Musculoskeletal:     Right lower leg: No edema.     Left lower leg: No edema.    Studies Reviewed: .    CT CORONARY MORPH W/CTA COR W/SCORE 06/02/2023   Left anterior descending artery: 630;  Left circumflex artery: 193; Right coronary artery: 592;   Total: 1504,  Percentile: 95 Total plaque volume is 1268 mm3, which is 90th percentile for age- and sex-matched controls (calcified plaque 353 mm3; non-calcified plaque 915 mm3, Low attenuation 38 mm3). TPV is extensive.  RCA is a large dominant artery that gives rise to PDA and PLA. The proximal RCA with diffuse mild (25-49%) calcifications. Moderate (50-69%) soft plaque in the mid portion of the vessel. Distal RCA with minimal soft plaque. Left main is a large artery that gives rise to LAD, Intermedius Ramus and LCX arteries. Ramus - Mild soft plaque in the proximal portion of the intermedius ramus artery. In the mid portion of the vessel in a moderate mixed plaque. The distal Ramus with no plaques. LAD is a large vessel. Minimal (<24%) calcification in the proximal LAD. Proximal to mid LAD with a focal mixed plaque. In the mid portion of the vessel lies a focal severe (>70%) soft plaque. Distal LAD with diffuse mild calcification. D1 is a small vessel with moderate soft plaque in the proximal portion of the vessel. LCX is a non-dominant artery that gives rise to one large OM1 branch. There is a mild focal calcification in the proximal LCX. Moderate mix plaque in the mid portion of the LCX  artery. OM1 with minimal soft plaque in the mid portion of the vessel.  1. Left Main: FFR = 0.99 2. LAD: Proximal FFR = 0.98, mid FFR = 0.64, distal FFR = <0.50 3. LCX: Not analyzed 4. RCA: Proximal FFR =  0.97, mid FFR =0.61, distal FFR = 0.60   IMPRESSION:  CT FFR analysis showed flow limiting stenosis in the mid LAD and the mid RCA.  EKG:    EKG Interpretation Date/Time:  Tuesday June 16 2023 14:27:39 EST Ventricular Rate:  69 PR Interval:  208 QRS Duration:  94 QT Interval:  384 QTC Calculation: 411 R Axis:   5  Text Interpretation: EKG 06/16/2023: Normal sinus rhythm at rate of 69 bpm, normal axis, poor R wave progression, probably normal variant however cannot exclude anteroseptal infarct old.  Compared to 05/20/2023, no significant change. Confirmed by Khya Halls, Jagadeesh [id number]) on 06/16/2023 2:35:47 PM    EKG 05/20/2023: Normal sinus rhythm at rate of 75 bpm, normal EKG.  Medications and allergies    Allergies  Allergen Reactions   Hydrocodone      REACTION: anxiety reaction   Epinephrine Anxiety     Current Outpatient Medications:    albuterol  (VENTOLIN  HFA) 108 (90 Base) MCG/ACT inhaler, INHALE TWO PUFFS BY MOUTH FOUR TIMES DAILY AS NEEDED, Disp: 8.5 each, Rfl: 2   ALPRAZolam  (XANAX ) 0.5 MG tablet, TAKE 1 TABLET BY MOUTH TWICE A DAY AS NEEDED, Disp: 60 tablet, Rfl: 2   amLODipine  (NORVASC ) 2.5 MG tablet, TAKE 1 TABLET BY MOUTH EVERY DAY, Disp: 90 tablet, Rfl: 3   aspirin 81 MG tablet, Take 81 mg by mouth daily., Disp: , Rfl:    atorvastatin  (LIPITOR) 80 MG tablet, TAKE 1 TABLET BY MOUTH EVERY DAY, Disp: 90 tablet, Rfl: 3   Blood Glucose Monitoring Suppl (FREESTYLE LITE) DEVI, Use as directed twice per day E11.9, Disp: 1 each, Rfl: 0   Cholecalciferol (VITAMIN D3) 50 MCG (2000 UT) TABS, Take by mouth., Disp: , Rfl:    Continuous Blood Gluc Receiver (DEXCOM G7 RECEIVER) DEVI, Use as directed once daily E11.9, Disp: 1 each, Rfl: 0   Continuous Blood Gluc Sensor (DEXCOM G7  SENSOR) MISC, Use as directed once every 14 days E11.9, Disp: 6 each, Rfl: 3   [employer] VITAMIN B12 1000 MCG tablet, TAKE 1 TABLET BY MOUTH EVERY DAY, Disp: 90 tablet, Rfl: 3   ezetimibe  (ZETIA ) 10 MG tablet, TAKE 1 TABLET BY MOUTH EVERY DAY, Disp: 90 tablet, Rfl: 3   FARXIGA  10 MG TABS tablet, TAKE 1 TABLET BY MOUTH DAILY BEFORE BREAKFAST., Disp: 90 tablet, Rfl: 3   glucose blood (FREESTYLE LITE) test strip, Use to check blood sugar twice a day, Disp: 200 each, Rfl: 5   JANUVIA  100 MG tablet, TAKE 1 TABLET BY MOUTH EVERY DAY, Disp: 90 tablet, Rfl: 3   Lancets (FREESTYLE) lancets, Use as directed twice per day E11.9, Disp: 100 each, Rfl: 3   lisinopril -hydrochlorothiazide  (ZESTORETIC ) 20-12.5 MG tablet, TAKE 2 TABLETS BY MOUTH DAILY, Disp: 180 tablet, Rfl: 3   metFORMIN  (GLUCOPHAGE ) 500 MG tablet, TAKE 2 TABLETS BY MOUTH 2 (TWO) TIMES DAILY WITH A MEAL., Disp: 360 tablet, Rfl: 3   metoprolol  succinate (TOPROL -XL) 50 MG 24 hr tablet, Take 1 tablet (50 mg total) by mouth daily. Take with or immediately following a meal., Disp: 90 tablet, Rfl: 3   omeprazole  (PRILOSEC) 20 MG capsule, TAKE ONE CAPSULE BY MOUTH DAILY, Disp: 90 capsule, Rfl: 2   Semaglutide  (RYBELSUS ) 14 MG TABS, Take 1 tablet (14 mg total) by mouth daily., Disp: 90 tablet, Rfl: 3   ASSESSMENT AND PLAN: .      ICD-10-CM   1. Coronary artery disease involving native coronary artery of native heart without angina pectoris  I25.10 EKG  12-Lead    2. Hyperlipidemia LDL goal <70  E78.5     3. Type 2 diabetes mellitus without complication, without long-term current use of insulin (HCC)  E11.9     4. Primary hypertension  I10      Assessment and Plan    Coronary Artery Disease (CAD) Patient with hyperlipidemia, hypertension, diabetes, and stage 3 chronic kidney disease.  Reviewed his coronary CTA.  No proximal vessel disease.  He remains asymptomatic.  Hence would recommend medical therapy in view of significant amount of soft plaque would  suspect this will improve with exercise and continued aggressive lipid management and diabetes management. Patient is presently on best medical therapy hence would like to give time to see whether his symptoms of dyspnea would worsen over time in spite of weight loss and being on appropriate medical therapy then would consider cardiac catheterization or if he were to develop anginal symptoms would have a low threshold to perform cardiac catheterization.  Reports no acute chest pain but has chronic gradual exertional dyspnea.  On optimal medical therapy (atorvastatin , metoprolol , lisinopril ). Discussed that stenting does not prevent myocardial infarctions in asymptomatic individuals. Emphasized lifestyle changes and medications. Catheterization may be reconsidered if symptoms worsen. - Continue atorvastatin  80 mg daily, metoprolol  succinate 50 mg daily, lisinopril  - Encourage regular physical activity, including walking - Monitor for symptoms such as chest pain or increased dyspnea - Follow up in 3 months with cardiology  Hyperlipidemia LDL well-controlled at 21 mg/dL on atorvastatin  80 mg daily. Triglycerides elevated, likely due to dietary factors. Discussed dietary modifications to reduce sugar and starch intake. - Continue atorvastatin  80 mg daily - Encourage dietary modifications to reduce sugar and starch intake - Consider niacin  supplementation if not already taking  Type 2 Diabetes Mellitus Diabetes well-managed on metformin , Januvia , and Rybelsus . Significant weight loss contributing to better glycemic control. Emphasized continued weight loss and physical activity to enhance Rybelsus  effects. - Continue metformin , Januvia , Rybelsus  - Encourage further weight loss through diet and exercise - Monitor blood glucose levels regularly  Obstructive Sleep Apnea (OSA) Diagnosed with OSA but has not used CPAP for a year following significant weight loss. Reports improved sleep quality and no  apneic episodes per spouse. Recommended retrying CPAP for one month to assess improvement. - Retry CPAP for one month to assess improvement in sleep quality - Monitor for symptoms of OSA such as daytime sleepiness or nocturnal awakenings  Follow-up - Follow up with NP Rosaline Bane on April 9th - Follow up with Dr. Lynwood Rush on April 2nd - Return to cardiology in one year for reassessment.  -Patient is aware to contact us  if he were to notice change in his dyspnea that is chronic, chest heaviness or chest tightness with exertion activity.  Total time spent in review of patient's records, making complex decisions was 50 minutes.  Patient was pleased with the evaluation.  Signed,  Gordy Bergamo, MD, [facility] 06/16/2023, 3:36 PM [facility] [address] [place], [place] [address] Phone: [phone]. Fax:  [phone]

## 2023-06-17 ENCOUNTER — Other Ambulatory Visit (HOSPITAL_COMMUNITY): Payer: Self-pay

## 2023-06-17 NOTE — Telephone Encounter (Signed)
 Pharmacy Patient Advocate Encounter  Received notification from [employer] that Prior Authorization for Rybelsus  14MG  tablets  has been APPROVED from 06/12/23 to 06/15/24. Ran test claim, Copay is $40. This test claim was processed through [facility]- copay amounts may vary at other pharmacies due to pharmacy/plan contracts, or as the patient moves through the different stages of their insurance plan.   PA #/Case ID/Reference #: [id number]

## 2023-06-28 ENCOUNTER — Encounter: Payer: Self-pay | Admitting: Internal Medicine

## 2023-06-28 DIAGNOSIS — Z1211 Encounter for screening for malignant neoplasm of colon: Secondary | ICD-10-CM

## 2023-07-04 ENCOUNTER — Other Ambulatory Visit: Payer: Self-pay | Admitting: Internal Medicine

## 2023-07-11 ENCOUNTER — Encounter: Payer: Self-pay | Admitting: Internal Medicine

## 2023-07-13 MED ORDER — SITAGLIPTIN PHOSPHATE 100 MG PO TABS: 100.0000 mg | ORAL_TABLET | Freq: Every day | ORAL | 3 refills | Status: AC

## 2023-07-15 ENCOUNTER — Other Ambulatory Visit: Payer: Self-pay | Admitting: Internal Medicine

## 2023-07-15 ENCOUNTER — Encounter: Payer: Self-pay | Admitting: Internal Medicine

## 2023-07-15 DIAGNOSIS — R195 Other fecal abnormalities: Secondary | ICD-10-CM

## 2023-07-15 LAB — COLOGUARD: COLOGUARD: POSITIVE — AB

## 2023-07-23 ENCOUNTER — Other Ambulatory Visit: Payer: Self-pay

## 2023-07-23 ENCOUNTER — Other Ambulatory Visit: Payer: Self-pay | Admitting: Internal Medicine

## 2023-07-31 ENCOUNTER — Encounter: Payer: Self-pay | Admitting: Internal Medicine

## 2023-08-02 ENCOUNTER — Other Ambulatory Visit: Payer: Self-pay | Admitting: Internal Medicine

## 2023-08-03 ENCOUNTER — Other Ambulatory Visit (INDEPENDENT_AMBULATORY_CARE_PROVIDER_SITE_OTHER): Payer: No Typology Code available for payment source

## 2023-08-03 ENCOUNTER — Other Ambulatory Visit: Payer: Self-pay

## 2023-08-03 DIAGNOSIS — Z125 Encounter for screening for malignant neoplasm of prostate: Secondary | ICD-10-CM

## 2023-08-03 DIAGNOSIS — E559 Vitamin D deficiency, unspecified: Secondary | ICD-10-CM

## 2023-08-03 DIAGNOSIS — E538 Deficiency of other specified B group vitamins: Secondary | ICD-10-CM | POA: Diagnosis not present

## 2023-08-03 DIAGNOSIS — E1165 Type 2 diabetes mellitus with hyperglycemia: Secondary | ICD-10-CM

## 2023-08-03 LAB — BASIC METABOLIC PANEL
BUN: 33 mg/dL — ABNORMAL HIGH (ref 6–23)
CO2: 25 meq/L (ref 19–32)
Calcium: 9.8 mg/dL (ref 8.4–10.5)
Chloride: 101 meq/L (ref 96–112)
Creatinine, Ser: 1.78 mg/dL — ABNORMAL HIGH (ref 0.40–1.50)
GFR: 39.38 mL/min — ABNORMAL LOW (ref >60.00)
Glucose, Bld: 122 mg/dL — ABNORMAL HIGH (ref 70–99)
Potassium: 3.9 meq/L (ref 3.5–5.1)
Sodium: 138 meq/L (ref 135–145)

## 2023-08-03 LAB — URINALYSIS, ROUTINE W REFLEX MICROSCOPIC
Hgb urine dipstick: NEGATIVE
Ketones, ur: NEGATIVE
Leukocytes,Ua: NEGATIVE
Nitrite: NEGATIVE
RBC / HPF: NONE SEEN (ref 0–?)
Specific Gravity, Urine: 1.025 (ref 1.000–1.030)
Total Protein, Urine: NEGATIVE
Urine Glucose: >=1000 — AB
Urobilinogen, UA: 0.2 (ref 0.0–1.0)
pH: 6 (ref 5.0–8.0)

## 2023-08-03 LAB — CBC WITH DIFFERENTIAL/PLATELET
Basophils Absolute: 0.1 10*3/uL (ref 0.0–0.1)
Basophils Relative: 1.2 % (ref 0.0–3.0)
Eosinophils Absolute: 0.4 10*3/uL (ref 0.0–0.7)
Eosinophils Relative: 4.3 % (ref 0.0–5.0)
HCT: 41.7 % (ref 39.0–52.0)
Hemoglobin: 13.8 g/dL (ref 13.0–17.0)
Lymphocytes Relative: 26.7 % (ref 12.0–46.0)
Lymphs Abs: 2.7 10*3/uL (ref 0.7–4.0)
MCHC: 33.1 g/dL (ref 30.0–36.0)
MCV: 81.7 fl (ref 78.0–100.0)
Monocytes Absolute: 0.9 10*3/uL (ref 0.1–1.0)
Monocytes Relative: 8.9 % (ref 3.0–12.0)
Neutro Abs: 6.1 10*3/uL (ref 1.4–7.7)
Neutrophils Relative %: 58.9 % (ref 43.0–77.0)
Platelets: 197 10*3/uL (ref 150.0–400.0)
RBC: 5.11 Mil/uL (ref 4.22–5.81)
RDW: 14.8 % (ref 11.5–15.5)
WBC: 10.3 10*3/uL (ref 4.0–10.5)

## 2023-08-03 LAB — HEPATIC FUNCTION PANEL
ALT: 14 U/L (ref 0–53)
AST: 14 U/L (ref 0–37)
Albumin: 4.5 g/dL (ref 3.5–5.2)
Alkaline Phosphatase: 86 U/L (ref 39–117)
Bilirubin, Direct: 0.2 mg/dL (ref 0.0–0.3)
Total Bilirubin: 0.8 mg/dL (ref 0.2–1.2)
Total Protein: 7.4 g/dL (ref 6.0–8.3)

## 2023-08-03 LAB — MICROALBUMIN / CREATININE URINE RATIO
Creatinine,U: 189.8 mg/dL
Microalb Creat Ratio: 13.4 mg/g (ref 0.0–30.0)
Microalb, Ur: 2.5 mg/dL — ABNORMAL HIGH (ref 0.0–1.9)

## 2023-08-03 LAB — VITAMIN B12: Vitamin B-12: 1164 pg/mL — ABNORMAL HIGH (ref 211–911)

## 2023-08-03 LAB — LIPID PANEL
Cholesterol: 111 mg/dL (ref 0–200)
HDL: 31.8 mg/dL — ABNORMAL LOW (ref >39.00)
LDL Cholesterol: 42 mg/dL (ref 0–99)
NonHDL: 79
Total CHOL/HDL Ratio: 3
Triglycerides: 185 mg/dL — ABNORMAL HIGH (ref 0.0–149.0)
VLDL: 37 mg/dL (ref 0.0–40.0)

## 2023-08-03 LAB — HEMOGLOBIN A1C: Hgb A1c MFr Bld: 6.6 % — ABNORMAL HIGH (ref 4.6–6.5)

## 2023-08-03 LAB — PSA: PSA: 1.31 ng/mL (ref 0.10–4.00)

## 2023-08-03 LAB — TSH: TSH: 3.62 u[IU]/mL (ref 0.35–5.50)

## 2023-08-03 LAB — VITAMIN D 25 HYDROXY (VIT D DEFICIENCY, FRACTURES): VITD: 48.65 ng/mL (ref 30.00–100.00)

## 2023-08-12 ENCOUNTER — Ambulatory Visit (INDEPENDENT_AMBULATORY_CARE_PROVIDER_SITE_OTHER): Payer: No Typology Code available for payment source | Admitting: Internal Medicine

## 2023-08-12 ENCOUNTER — Encounter: Payer: Self-pay | Admitting: Internal Medicine

## 2023-08-12 VITALS — BP 122/70 | HR 75 | Temp 98.1°F | Ht 72.0 in | Wt 251.0 lb

## 2023-08-12 DIAGNOSIS — E559 Vitamin D deficiency, unspecified: Secondary | ICD-10-CM

## 2023-08-12 DIAGNOSIS — Z0001 Encounter for general adult medical examination with abnormal findings: Secondary | ICD-10-CM

## 2023-08-12 DIAGNOSIS — E7800 Pure hypercholesterolemia, unspecified: Secondary | ICD-10-CM | POA: Diagnosis not present

## 2023-08-12 DIAGNOSIS — N1831 Chronic kidney disease, stage 3a: Secondary | ICD-10-CM | POA: Diagnosis not present

## 2023-08-12 DIAGNOSIS — Z0000 Encounter for general adult medical examination without abnormal findings: Secondary | ICD-10-CM | POA: Diagnosis not present

## 2023-08-12 DIAGNOSIS — I10 Essential (primary) hypertension: Secondary | ICD-10-CM

## 2023-08-12 DIAGNOSIS — E538 Deficiency of other specified B group vitamins: Secondary | ICD-10-CM

## 2023-08-12 DIAGNOSIS — E1165 Type 2 diabetes mellitus with hyperglycemia: Secondary | ICD-10-CM | POA: Diagnosis not present

## 2023-08-12 NOTE — Patient Instructions (Addendum)
 Please repeat the kidney testing only in 2 weeks after working on hydration for several days prior  If the kidneys are not improved, we may need to stop the metformin, and also may need to refer to Nephrology  Please continue all other medications as before, and refills have been done if requested.  Please have the pharmacy call with any other refills you may need.  Please continue your efforts at being more active, low cholesterol diet, and weight control.  You are otherwise up to date with prevention measures today.  Please keep your appointments with your specialists as you may have planned  Please make an Appointment to return in 6 months, or sooner if needed, also with Lab Appointment for testing done 3-5 days before at the FIRST FLOOR Lab (so this is for TWO appointments - please see the scheduling desk as you leave)

## 2023-08-12 NOTE — Progress Notes (Unsigned)
 Patient ID: Joseph Maxwell, male   DOB: 31-May-1955, 68 y.o.   MRN: [id number]         Chief Complaint:: wellness exam and ckd3a, dm, hth, hld, low vit d and b12       HPI:  Joseph Maxwell is a 68 y.o. male here for wellness exam; up to date                        Also Pt denies chest pain, increased sob or doe, wheezing, orthopnea, PND, increased LE swelling, palpitations, dizziness or syncope.   Pt denies polydipsia, polyuria, or new focal neuro s/s.    Pt denies fever, wt loss, night sweats, loss of appetite, or other constitutional symptoms     Wt Readings from Last 3 Encounters:  08/13/23 251 lb (113.9 kg)  08/12/23 251 lb (113.9 kg)  06/16/23 262 lb (118.8 kg)   BP Readings from Last 3 Encounters:  08/12/23 122/70  06/16/23 132/69  06/02/23 (!) 146/67   Immunization History  Administered Date(s) Administered   Fluad Trivalent(High Dose 65+) 02/11/2023   Influenza Whole 06/06/2009, 01/18/2010   Influenza, Seasonal, Injecte, Preservative Fre 07/23/2012   Influenza,inj,Quad PF,6+ Mos 01/25/2013, 01/31/2014, 01/30/2015, 01/31/2016, 01/30/2017, 02/02/2018, 02/04/2019, 02/08/2020, 02/07/2021, 02/10/2022   PFIZER(Purple Top)SARS-COV-2 Vaccination 06/20/2019, 07/11/2019, 02/08/2020   PNEUMOCOCCAL CONJUGATE-20 08/12/2022   Pneumococcal Conjugate-13 07/31/2015   Pneumococcal Polysaccharide-23 06/10/2010, 07/31/2016   Td 09/06/2007   Tdap 02/02/2018   Zoster Recombinant(Shingrix) 02/08/2020, 02/08/2021   There are no preventive care reminders to display for this patient.     Past Medical History:  Diagnosis Date   ALLERGIC RHINITIS    BACK PAIN    Cervical disc disease    DIABETES MELLITUS    DIVERTICULOSIS, COLON    ERECTILE DYSFUNCTION    FAMILIAL TREMOR    GERD    HYPERLIPIDEMIA    HYPERTENSION    OBSTRUCTIVE SLEEP APNEA    ONYCHOMYCOSIS, TOENAILS    PLANTAR FASCIITIS, LEFT    Unspecified asthma(493.90)    Past Surgical History:  Procedure Laterality Date   NECK  SURGERY  04/08/10   2 fusion and disctecomy/Dr. Wynetta Emery    reports that he has never smoked. He has never used smokeless tobacco. He reports current alcohol use. He reports that he does not use drugs. family history includes Chronic Renal Failure in his sister; Diabetes in his father, mother, and sister; Heart failure in his mother; Hypertension in an other family member; Lung cancer in an other family member; Prostate cancer in his father; Sleep apnea in his mother; Stroke in his mother. Allergies  Allergen Reactions   Hydrocodone     REACTION: anxiety reaction   Epinephrine Anxiety   Current Outpatient Medications on File Prior to Visit  Medication Sig Dispense Refill   albuterol (VENTOLIN HFA) 108 (90 Base) MCG/ACT inhaler INHALE TWO PUFFS BY MOUTH FOUR TIMES DAILY AS NEEDED 8.5 each 2   ALPRAZolam (XANAX) 0.5 MG tablet TAKE 1 TABLET BY MOUTH TWICE A DAY AS NEEDED 60 tablet 2   amLODipine (NORVASC) 2.5 MG tablet TAKE 1 TABLET BY MOUTH EVERY DAY 90 tablet 3   aspirin 81 MG tablet Take 81 mg by mouth daily.     atorvastatin (LIPITOR) 80 MG tablet TAKE 1 TABLET BY MOUTH EVERY DAY 90 tablet 3   Blood Glucose Monitoring Suppl (FREESTYLE LITE) DEVI Use as directed twice per day E11.9 1 each 0   Cholecalciferol (VITAMIN D3) 50 MCG (2000  UT) TABS Take by mouth.     Continuous Blood Gluc Receiver (DEXCOM G7 RECEIVER) DEVI Use as directed once daily E11.9 1 each 0   Continuous Blood Gluc Sensor (DEXCOM G7 SENSOR) MISC Use as directed once every 14 days E11.9 6 each 3   [facility] VITAMIN B12 1000 MCG tablet TAKE 1 TABLET BY MOUTH EVERY DAY 90 tablet 3   ezetimibe (ZETIA) 10 MG tablet TAKE 1 TABLET BY MOUTH EVERY DAY 90 tablet 3   FARXIGA 10 MG TABS tablet TAKE 1 TABLET BY MOUTH DAILY BEFORE BREAKFAST. 90 tablet 3   glucose blood (FREESTYLE LITE) test strip Use to check blood sugar twice a day 200 each 5   Lancets (FREESTYLE) lancets Use as directed twice per day E11.9 100 each 3    lisinopril-hydrochlorothiazide (ZESTORETIC) 20-12.5 MG tablet TAKE 2 TABLETS BY MOUTH DAILY 180 tablet 3   metFORMIN (GLUCOPHAGE) 500 MG tablet TAKE 2 TABLETS BY MOUTH 2 (TWO) TIMES DAILY WITH A MEAL. 360 tablet 3   metoprolol succinate (TOPROL-XL) 50 MG 24 hr tablet Take 1 tablet (50 mg total) by mouth daily. Take with or immediately following a meal. 90 tablet 3   omeprazole (PRILOSEC) 20 MG capsule TAKE 1 CAPSULE BY MOUTH DAILY 90 capsule 2   sitaGLIPtin (JANUVIA) 100 MG tablet Take 1 tablet (100 mg total) by mouth daily. 90 tablet 3   No current facility-administered medications on file prior to visit.        ROS:  All others reviewed and negative.  Objective        PE:  BP 122/70 (BP Location: Right Arm, Patient Position: Sitting, Cuff Size: Normal)   Pulse 75   Temp 98.1 F (36.7 C) (Oral)   Ht 6' (1.829 m)   Wt 251 lb (113.9 kg)   SpO2 97%   BMI 34.04 kg/m                 Constitutional: Pt appears in NAD               HENT: Head: NCAT.                Right Ear: External ear normal.                 Left Ear: External ear normal.                Eyes: . Pupils are equal, round, and reactive to light. Conjunctivae and EOM are normal               Nose: without d/c or deformity               Neck: Neck supple. Gross normal ROM               Cardiovascular: Normal rate and regular rhythm.                 Pulmonary/Chest: Effort normal and breath sounds without rales or wheezing.                Abd:  Soft, NT, ND, + BS, no organomegaly               Neurological: Pt is alert. At baseline orientation, motor grossly intact               Skin: Skin is warm. No rashes, no other new lesions, LE edema - none  Psychiatric: Pt behavior is normal without agitation   Micro: none  Cardiac tracings I have personally interpreted today:  none  Pertinent Radiological findings (summarize): none   Lab Results  Component Value Date   WBC 10.3 08/03/2023   HGB 13.8 08/03/2023    HCT 41.7 08/03/2023   PLT 197.0 08/03/2023   GLUCOSE 122 (H) 08/03/2023   CHOL 111 08/03/2023   TRIG 185.0 (H) 08/03/2023   HDL 31.80 (L) 08/03/2023   LDLDIRECT 61.0 08/05/2022   LDLCALC 42 08/03/2023   ALT 14 08/03/2023   AST 14 08/03/2023   NA 138 08/03/2023   K 3.9 08/03/2023   CL 101 08/03/2023   CREATININE 1.78 (H) 08/03/2023   BUN 33 (H) 08/03/2023   CO2 25 08/03/2023   TSH 3.62 08/03/2023   PSA 1.31 08/03/2023   HGBA1C 6.6 (H) 08/03/2023   MICROALBUR 2.5 (H) 08/03/2023   Assessment/Plan:  Odell Choung is a 68 y.o. White or Caucasian [1] male with  has a past medical history of ALLERGIC RHINITIS, BACK PAIN, Cervical disc disease, DIABETES MELLITUS, DIVERTICULOSIS, COLON, ERECTILE DYSFUNCTION, FAMILIAL TREMOR, GERD, HYPERLIPIDEMIA, HYPERTENSION, OBSTRUCTIVE SLEEP APNEA, ONYCHOMYCOSIS, TOENAILS, PLANTAR FASCIITIS, LEFT, and Unspecified asthma(493.90).  Encounter for well adult exam with abnormal findings Age and sex appropriate education and counseling updated with regular exercise and diet Referrals for preventative services - none needed Immunizations addressed - none needed Smoking counseling  - none needed Evidence for depression or other mood disorder - none significant Most recent labs reviewed. I have personally reviewed and have noted: 1) the patient's medical and social history 2) The patient's current medications and supplements 3) The patient's height, weight, and BMI have been recorded in the chart   Vitamin D deficiency Last vitamin D Lab Results  Component Value Date   VD25OH 48.65 08/03/2023   Stable, cont oral replacement   B12 deficiency Lab Results  Component Value Date   VITAMINB12 1,164 (H) 08/03/2023   Stable, cont oral replacement - b12 1000 mcg qd   Essential hypertension BP Readings from Last 3 Encounters:  08/12/23 122/70  06/16/23 132/69  06/02/23 (!) 146/67   Stable, pt to continue medical treatment norvasc 2.5 mg,  zestoretic 20 12.5 every day, toprol xl 50 qd   Hyperlipidemia Lab Results  Component Value Date   LDLCALC 42 08/03/2023   Stable, pt to continue current statin zetia 10 qd   Diabetes (HCC) Lab Results  Component Value Date   HGBA1C 6.6 (H) 08/03/2023   Stable, pt to continue current medical treatment but consider d/c metformin pending f/u renal fxn if Cr > 1.5    CKD (chronic kidney disease) Lab Results  Component Value Date   CREATININE 1.78 (H) 08/03/2023   Mild worsening overall, cont to avoid nephrotoxins, recheck in 2 wks after hydration, consider renal referral and d/c metformin   Followup: Return in about 6 months (around 02/11/2024).  Oliver Barre, MD 08/13/2023 8:47 PM [facility]

## 2023-08-13 ENCOUNTER — Other Ambulatory Visit: Payer: Self-pay

## 2023-08-13 ENCOUNTER — Other Ambulatory Visit: Payer: Self-pay | Admitting: Internal Medicine

## 2023-08-13 ENCOUNTER — Ambulatory Visit (AMBULATORY_SURGERY_CENTER)

## 2023-08-13 ENCOUNTER — Encounter: Payer: Self-pay | Admitting: Internal Medicine

## 2023-08-13 VITALS — Ht 72.0 in | Wt 251.0 lb

## 2023-08-13 DIAGNOSIS — Z1211 Encounter for screening for malignant neoplasm of colon: Secondary | ICD-10-CM

## 2023-08-13 MED ORDER — NA SULFATE-K SULFATE-MG SULF 17.5-3.13-1.6 GM/177ML PO SOLN: 1.0000 | Freq: Once | ORAL | 0 refills | Status: AC

## 2023-08-13 MED ORDER — RYBELSUS 14 MG PO TABS: 1.0000 | ORAL_TABLET | Freq: Every day | ORAL | 3 refills | Status: DC

## 2023-08-13 NOTE — Telephone Encounter (Signed)
 Copied from CRM [id number]. Topic: Clinical - Medication Refill >> Aug 13, 2023  3:19 PM Sonny Dandy B wrote: Most Recent Primary Care Visit:  Provider: Corwin Levins  Department: [facility]  Visit Type: PHYSICAL  Date: 08/12/2023  Medication: Semaglutide (RYBELSUS) 14 MG TABS  Has the patient contacted their pharmacy? Yes (Agent: If no, request that the patient contact the pharmacy for the refill. If patient does not wish to contact the pharmacy document the reason why and proceed with request.) (Agent: If yes, when and what did the pharmacy advise?)  Is this the correct pharmacy for this prescription? Yes If no, delete pharmacy and type the correct one.  This is the patient's preferred pharmacy:  [id number] - [place], [place] - [address] AT [address]NER OF [facility] [address] [place] [address] Phone: [phone] Fax: [phone]     Has the prescription been filled recently? Yes  Is the patient out of the medication? Yes  Has the patient been seen for an appointment in the last year OR does the patient have an upcoming appointment? Yes  Can we respond through MyChart? Yes  Agent: Please be advised that Rx refills may take up to 3 business days. We ask that you follow-up with your pharmacy.

## 2023-08-13 NOTE — Assessment & Plan Note (Signed)

## 2023-08-13 NOTE — Assessment & Plan Note (Signed)
 Lab Results  Component Value Date   CREATININE 1.78 (H) 08/03/2023   Mild worsening overall, cont to avoid nephrotoxins, recheck in 2 wks after hydration, consider renal referral and d/c metformin

## 2023-08-13 NOTE — Assessment & Plan Note (Signed)
 BP Readings from Last 3 Encounters:  08/12/23 122/70  06/16/23 132/69  06/02/23 (!) 146/67   Stable, pt to continue medical treatment norvasc 2.5 mg, zestoretic 20 12.5 every day, toprol xl 50 qd

## 2023-08-13 NOTE — Assessment & Plan Note (Signed)
 Lab Results  Component Value Date   LDLCALC 42 08/03/2023   Stable, pt to continue current statin zetia 10 qd

## 2023-08-13 NOTE — Assessment & Plan Note (Signed)
 Lab Results  Component Value Date   VITAMINB12 1,164 (H) 08/03/2023   Stable, cont oral replacement - b12 1000 mcg qd

## 2023-08-13 NOTE — Assessment & Plan Note (Signed)
 Last vitamin D Lab Results  Component Value Date   VD25OH 48.65 08/03/2023   Stable, cont oral replacement

## 2023-08-13 NOTE — Patient Instructions (Signed)
 [facility] has implemented a new process for scheduling procedures.  Please note your arrival time for the [facility] is your appointment time that is shown on your written instructions.  Please do not arrive one hour prior to the time listed in your instructions.  Please ignore any outside notifications to arrive one hour early.  We apologize for any confusion and look forward to seeing you for your procedure.

## 2023-08-13 NOTE — Assessment & Plan Note (Signed)
 Lab Results  Component Value Date   HGBA1C 6.6 (H) 08/03/2023   Stable, pt to continue current medical treatment but consider d/c metformin pending f/u renal fxn if Cr > 1.5

## 2023-08-13 NOTE — Telephone Encounter (Signed)
 Last Fill: Unknown  Last OV: 08/12/23  Next OV: 02/11/24  Routing to provider for review/authorization.

## 2023-08-13 NOTE — Progress Notes (Signed)

## 2023-08-18 NOTE — Progress Notes (Unsigned)
 Cardiology Office Note:  .   Date:  08/19/2023 ID:  Joseph Maxwell, DOB 03/26/1956, MRN [id number] PCP: Corwin Levins, MD [facility] Providers Cardiologist:  None   Patient Profile: .      PMH Type 2 DM Coronary artery disease CT Calcium score 03/20/23 CAC score 1474 (95th percentile) LM 86.5, LAD 547, LCx 259, RCA 581 Hyperlipidemia Hypertension OSA Obesity Vitamin B12 deficiency CKD Stage IIIb  Referred to cardiology and seen as a new patient by me on 05/20/2023 for elevated coronary calcium score of 1474 (95th percentile). CT ordered for risk stratification. He reports that his lifestyle is largely sedentary, with minimal physical activity beyond occasional dog walking and household chores. Was recently started on Rybelsus which has helped curb his appetite. Diet consists mostly of home-cooked meals, with a conscious effort to limit sugar intake due to diabetes. Strong family history of CHF in his mother's family. 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 Is a volunteer with [employer] and admits that recently he has noted some activity intolerance with hiking up an elevation, including chest discomfort and lightheadedness, especially when bending over. Rarely drinks water, mostly tea and frequently eats sausage biscuits for breakfast. He has recently tried to reduce his intake of snack foods. Cardiac history significant for valve replacement in his mother and a strong history of CHF in his mother's family.  ASCVD risk score 17.8%.  He underwent coronary CTA and was found to have LAD and RCA high-grade stenosis.  He was seen by Dr. Jacinto Halim on 06/15/2022 to consider medical therapy versus coronary angiography.  He denied chest pain.  Reported lightheadedness when bending over and standing back up such as during showers.  Noted a decrease in ability to work as hard therefore he does not push himself to the  point of having to stop completely.  He was involved in activities such as yard work, including push mowing and chopping leaves, and occasionally walking around the parking lot at [employer] where he works 3 hours daily.  He has not used his CPAP machine in the last year due to weight loss.  Felt to be overall asymptomatic, plan to continue medical therapy in view of significant amount of soft plaque as well as no proximal disease.  Advised to continue atorvastatin 80 mg daily, metoprolol succinate 50 mg daily, and lisinopril.  Increase physical activity and report side effects.  Encouraged dietary modifications and weight loss.        History of Present Illness: .   Joseph Maxwell is a very pleasant 68 y.o. male  who is here today follow-up of CAD.  He reports he continues to feel well and is more active recently due to warmer weather.  He has been making dietary changes including increasing his water intake and choosing healthier foods.  He notes occasional lightheadedness since starting metoprolol, but this is not debilitating.  Recent blood test showed decrease in GFR from 54-39 and he wonders if coronary CTA contributed.  He is considering potentially discontinuing metformin and referral to nephrologist by PCP.  He reports an occasional chest pain but these are generally short and resolve on their own.  He denies dyspnea, orthopnea, PND, edema, palpitations, presyncope, or syncope.  No concerning side effects with any of his cardiac medications.  ROS: See HPI       Studies Reviewed: .  Risk Assessment/Calculations:             Physical Exam:   VS: BP 124/70   Pulse 75   Ht 6' (1.829 m)   Wt 255 lb 3.2 oz (115.8 kg)   SpO2 97%   BMI 34.61 kg/m   Wt Readings from Last 3 Encounters:  08/19/23 255 lb 3.2 oz (115.8 kg)  08/13/23 251 lb (113.9 kg)  08/12/23 251 lb (113.9 kg)     GEN: Obese, well developed in no acute distress NECK: No JVD; No carotid bruits CARDIAC: RRR, no  murmurs, rubs, gallops RESPIRATORY:  Clear to auscultation without rales, wheezing or rhonchi  ABDOMEN: Soft, non-tender, non-distended EXTREMITIES:  No edema; No deformity     ASSESSMENT AND PLAN: .    CAD without angina: Elevated coronary artery calcium score of 1474. Coronary CTA 06/02/2023 revealed significant stenosis in mid LAD and mid RCA. Referred to interventional cardiology and through shared decision making, medical therapy initiated.  He is tolerating metoprolol, amlodipine, atorvastatin, aspirin, ezetimibe, Farxiga, and lisinopril/hydrochlorothiazide without concerning side effects.  No bleeding concerns.  LDL is well-controlled.  Advised him of symptoms concerning for angina and to report in the future.  ER precautions advised. Focus on secondary prevention including heart healthy mostly plant based diet avoiding saturated fat, processed foods, simple carbohydrates, and sugar along with aiming for at least 150 minutes of moderate intensity exercise each week.   Hyperlipidemia LDL goal < 70: Lipid panel completed 08/03/2023 with total cholesterol 111, triglycerides 185, HDL 31, and LDL 42.  LDL is at goal. Continue to work on heart healthy diet and increasing physical activity to reduce triglycerides. He is not having any concerning side effects on medication. Continue atorvastatin and ezetimibe.  Hypertension: BP is well-controlled.  Renal function worse but stable on labs completed 08/03/2023.  He is increasing hydration and is considering referral to nephrology.  No change in anti-hypertensive therapy today. Management per PCP.  Diabetes: Slight improvement in A1c to 6.6% on 08/03/2023, down from 6.7% on 02/06/23. No significant weight loss but he feels appetite and diet have improved on Rybelsus. We discussed heart healthy diet which will likely also improve A1C along with aiming for 150 minutes of moderate intensity exercise each week. Management per PCP.    Plan/Goals: 1: Increase  physical activity aiming for 150 minutes of moderate intensity exercise like walking for 30 minutes 5 days per week    1: Increase physical activity aiming for 150 minutes of moderate intensity exercise like walking for 30 minutes 5 days per week        Disposition: 1 year with Dr. Jacinto Halim or me  Signed, Eligha Bridegroom, NP-C

## 2023-08-19 ENCOUNTER — Ambulatory Visit (HOSPITAL_BASED_OUTPATIENT_CLINIC_OR_DEPARTMENT_OTHER): Payer: Managed Care, Other (non HMO) | Admitting: Nurse Practitioner

## 2023-08-19 ENCOUNTER — Encounter (HOSPITAL_BASED_OUTPATIENT_CLINIC_OR_DEPARTMENT_OTHER): Payer: Self-pay | Admitting: Nurse Practitioner

## 2023-08-19 VITALS — BP 124/70 | HR 75 | Ht 72.0 in | Wt 255.2 lb

## 2023-08-19 DIAGNOSIS — E785 Hyperlipidemia, unspecified: Secondary | ICD-10-CM | POA: Diagnosis not present

## 2023-08-19 DIAGNOSIS — I2510 Atherosclerotic heart disease of native coronary artery without angina pectoris: Secondary | ICD-10-CM | POA: Diagnosis not present

## 2023-08-19 DIAGNOSIS — I10 Essential (primary) hypertension: Secondary | ICD-10-CM

## 2023-08-19 DIAGNOSIS — E1165 Type 2 diabetes mellitus with hyperglycemia: Secondary | ICD-10-CM | POA: Diagnosis not present

## 2023-08-19 NOTE — Patient Instructions (Signed)
 Medication Instructions:  Your physician recommends that you continue on your current medications as directed. Please refer to the Current Medication list given to you today.  Follow-Up: At [facility], you and your health needs are our priority.  As part of our continuing mission to provide you with exceptional heart care, our providers are all part of one team.  This team includes your primary Cardiologist (physician) and Advanced Practice Providers or APPs (Physician Assistants and Nurse Practitioners) who all work together to provide you with the care you need, when you need it.  Your next appointment:   1 year with Eligha Bridegroom, NP   We recommend signing up for the patient portal called "MyChart".  Sign up information is provided on this After Visit Summary.  MyChart is used to connect with patients for Virtual Visits (Telemedicine).  Patients are able to view lab/test results, encounter notes, upcoming appointments, etc.  Non-urgent messages can be sent to your provider as well.   To learn more about what you can do with MyChart, go to [email or web address].   Other Instructions Goals: 1: Increase physical activity aiming for 150 minutes of moderate intensity exercise like walking for 30 minutes 5 days per week

## 2023-09-01 ENCOUNTER — Encounter: Payer: Self-pay | Admitting: Internal Medicine

## 2023-09-02 ENCOUNTER — Ambulatory Visit: Admitting: Internal Medicine

## 2023-09-02 ENCOUNTER — Encounter: Payer: Self-pay | Admitting: Internal Medicine

## 2023-09-02 VITALS — BP 148/76 | HR 62 | Temp 97.3°F | Resp 14 | Ht 72.0 in | Wt 251.0 lb

## 2023-09-02 DIAGNOSIS — D120 Benign neoplasm of cecum: Secondary | ICD-10-CM

## 2023-09-02 DIAGNOSIS — Z1211 Encounter for screening for malignant neoplasm of colon: Secondary | ICD-10-CM | POA: Diagnosis present

## 2023-09-02 DIAGNOSIS — D122 Benign neoplasm of ascending colon: Secondary | ICD-10-CM

## 2023-09-02 DIAGNOSIS — D123 Benign neoplasm of transverse colon: Secondary | ICD-10-CM

## 2023-09-02 MED ORDER — SODIUM CHLORIDE 0.9 % IV SOLN
500.0000 mL | Freq: Once | INTRAVENOUS | Status: DC
Start: 2023-09-02 — End: 2023-09-02

## 2023-09-02 NOTE — Progress Notes (Signed)
 GASTROENTEROLOGY PROCEDURE H&P NOTE   Primary Care Physician: Roslyn Coombe, MD    Reason for Procedure:   Colon cancer screening  Plan:    Colonoscopy  Patient is appropriate for endoscopic procedure(s) in the ambulatory ([facility]) setting.  The nature of the procedure, as well as the risks, benefits, and alternatives were carefully and thoroughly reviewed with the patient. Ample time for discussion and questions allowed. The patient understood, was satisfied, and agreed to proceed.     HPI: Joseph Maxwell is a 68 y.o. male who presents for colonoscopy for colon cancer screening. Denies blood in stools, changes in bowel habits, or unintentional weight loss. Denies family history of colon cancer.  Colonoscopy 08/09/09: Diverticulosis in the sigmoid colon. Small internal hemorrhoids. Normal screening. Repeat colonoscopy in 10 years.   Past Medical History:  Diagnosis Date   ALLERGIC RHINITIS    BACK PAIN    Cervical disc disease    DIABETES MELLITUS    DIVERTICULOSIS, COLON    ERECTILE DYSFUNCTION    FAMILIAL TREMOR    GERD    HYPERLIPIDEMIA    HYPERTENSION    OBSTRUCTIVE SLEEP APNEA    ONYCHOMYCOSIS, TOENAILS    PLANTAR FASCIITIS, LEFT    Unspecified asthma(493.90)     Past Surgical History:  Procedure Laterality Date   NECK SURGERY  04/08/10   2 fusion and disctecomy/Dr. Lamon Pillow    Prior to Admission medications   Medication Sig Start Date End Date Taking? Authorizing Provider  albuterol  (VENTOLIN  HFA) 108 (90 Base) MCG/ACT inhaler INHALE TWO PUFFS BY MOUTH FOUR TIMES DAILY AS NEEDED 03/05/23   Roslyn Coombe, MD  ALPRAZolam  (XANAX ) 0.5 MG tablet TAKE 1 TABLET BY MOUTH TWICE A DAY AS NEEDED 06/09/23   Roslyn Coombe, MD  amLODipine  (NORVASC ) 2.5 MG tablet TAKE 1 TABLET BY MOUTH EVERY DAY 08/26/22   Roslyn Coombe, MD  aspirin 81 MG tablet Take 81 mg by mouth daily.    [provider]  atorvastatin  (LIPITOR) 80 MG tablet TAKE 1 TABLET BY MOUTH EVERY DAY 08/03/23    Roslyn Coombe, MD  Blood Glucose Monitoring Suppl (FREESTYLE LITE) DEVI Use as directed twice per day E11.9 02/02/18   Roslyn Coombe, MD  Cholecalciferol (VITAMIN D3) 50 MCG (2000 UT) TABS Take by mouth.    [provider]  Continuous Blood Gluc Receiver (DEXCOM G7 RECEIVER) DEVI Use as directed once daily E11.9 08/12/22   Roslyn Coombe, MD  Continuous Blood Gluc Sensor (DEXCOM G7 SENSOR) MISC Use as directed once every 14 days E11.9 08/12/22   Roslyn Coombe, MD  [facility] VITAMIN B12 1000 MCG tablet TAKE 1 TABLET BY MOUTH EVERY DAY 04/13/23   Roslyn Coombe, MD  ezetimibe  (ZETIA ) 10 MG tablet TAKE 1 TABLET BY MOUTH EVERY DAY 08/26/22   Roslyn Coombe, MD  FARXIGA  10 MG TABS tablet TAKE 1 TABLET BY MOUTH DAILY BEFORE BREAKFAST. 09/08/22   Roslyn Coombe, MD  glucose blood (FREESTYLE LITE) test strip Use to check blood sugar twice a day 02/08/20   Roslyn Coombe, MD  Lancets (FREESTYLE) lancets Use as directed twice per day E11.9 02/02/18   Roslyn Coombe, MD  lisinopril -hydrochlorothiazide  (ZESTORETIC ) 20-12.5 MG tablet TAKE 2 TABLETS BY MOUTH DAILY 09/24/22   Roslyn Coombe, MD  metFORMIN  (GLUCOPHAGE ) 500 MG tablet TAKE 2 TABLETS BY MOUTH 2 (TWO) TIMES DAILY WITH A MEAL. 09/15/22   Roslyn Coombe, MD  metoprolol  succinate (TOPROL -XL) 50  MG 24 hr tablet Take 1 tablet (50 mg total) by mouth daily. Take with or immediately following a meal. 06/04/23   Swinyer, Leilani Punter, NP  omeprazole  (PRILOSEC) 20 MG capsule TAKE 1 CAPSULE BY MOUTH DAILY 07/23/23   Roslyn Coombe, MD  Semaglutide  (RYBELSUS ) 14 MG TABS Take 1 tablet (14 mg total) by mouth daily. 08/13/23   Roslyn Coombe, MD  sitaGLIPtin  (JANUVIA ) 100 MG tablet Take 1 tablet (100 mg total) by mouth daily. 07/13/23   Roslyn Coombe, MD    Current Outpatient Medications  Medication Sig Dispense Refill   albuterol  (VENTOLIN  HFA) 108 (90 Base) MCG/ACT inhaler INHALE TWO PUFFS BY MOUTH FOUR TIMES DAILY AS NEEDED 8.5 each 2   ALPRAZolam  (XANAX ) 0.5 MG tablet TAKE 1 TABLET BY  MOUTH TWICE A DAY AS NEEDED 60 tablet 2   amLODipine  (NORVASC ) 2.5 MG tablet TAKE 1 TABLET BY MOUTH EVERY DAY 90 tablet 3   aspirin 81 MG tablet Take 81 mg by mouth daily.     atorvastatin  (LIPITOR) 80 MG tablet TAKE 1 TABLET BY MOUTH EVERY DAY 90 tablet 3   Blood Glucose Monitoring Suppl (FREESTYLE LITE) DEVI Use as directed twice per day E11.9 1 each 0   Cholecalciferol (VITAMIN D3) 50 MCG (2000 UT) TABS Take by mouth.     Continuous Blood Gluc Receiver (DEXCOM G7 RECEIVER) DEVI Use as directed once daily E11.9 1 each 0   Continuous Blood Gluc Sensor (DEXCOM G7 SENSOR) MISC Use as directed once every 14 days E11.9 6 each 3   [facility]S VITAMIN B12 1000 MCG tablet TAKE 1 TABLET BY MOUTH EVERY DAY 90 tablet 3   ezetimibe  (ZETIA ) 10 MG tablet TAKE 1 TABLET BY MOUTH EVERY DAY 90 tablet 3   FARXIGA  10 MG TABS tablet TAKE 1 TABLET BY MOUTH DAILY BEFORE BREAKFAST. 90 tablet 3   glucose blood (FREESTYLE LITE) test strip Use to check blood sugar twice a day 200 each 5   Lancets (FREESTYLE) lancets Use as directed twice per day E11.9 100 each 3   lisinopril -hydrochlorothiazide  (ZESTORETIC ) 20-12.5 MG tablet TAKE 2 TABLETS BY MOUTH DAILY 180 tablet 3   metFORMIN  (GLUCOPHAGE ) 500 MG tablet TAKE 2 TABLETS BY MOUTH 2 (TWO) TIMES DAILY WITH A MEAL. 360 tablet 3   metoprolol  succinate (TOPROL -XL) 50 MG 24 hr tablet Take 1 tablet (50 mg total) by mouth daily. Take with or immediately following a meal. 90 tablet 3   omeprazole  (PRILOSEC) 20 MG capsule TAKE 1 CAPSULE BY MOUTH DAILY 90 capsule 2   Semaglutide  (RYBELSUS ) 14 MG TABS Take 1 tablet (14 mg total) by mouth daily. 30 tablet 3   sitaGLIPtin  (JANUVIA ) 100 MG tablet Take 1 tablet (100 mg total) by mouth daily. 90 tablet 3   Current Facility-Administered Medications  Medication Dose Route Frequency Provider Last Rate Last Admin   0.9 %  sodium chloride  infusion  500 mL Intravenous Once Daina Drum, MD        Allergies as of 09/02/2023 - Review Complete  08/19/2023  Allergen Reaction Noted   Epinephrine Anxiety 01/30/2015   Hydrocodone  Other (See Comments) 06/10/2010    Family History  Problem Relation Age of Onset   Diabetes Mother    Sleep apnea Mother    Heart failure Mother    Stroke Mother    Prostate cancer Father    Diabetes Father    Diabetes Sister    Chronic Renal Failure Sister    Hypertension Other  Lung cancer Other        great-grandfather   Colon cancer Neg Hx    Rectal cancer Neg Hx    Stomach cancer Neg Hx    Esophageal cancer Neg Hx     Social History   Socioeconomic History   Marital status: Married    Spouse name: Not on file   Number of children: Not on file   Years of education: Not on file   Highest education level: Master's degree (e.g., MA, MS, MEng, MEd, MSW, MBA)  Occupational History   Not on file  Tobacco Use   Smoking status: Never   Smokeless tobacco: Never  Vaping Use   Vaping status: Never Used  Substance and Sexual Activity   Alcohol use: Yes    Comment: Rare   Drug use: No   Sexual activity: Not on file  Other Topics Concern   Not on file  Social History Narrative   Not on file   Social Drivers of Health   Financial Resource Strain: Low Risk  (08/08/2023)   Overall Financial Resource Strain (CARDIA)    Difficulty of Paying Living Expenses: Not hard at all  Food Insecurity: No Food Insecurity (08/08/2023)   Hunger Vital Sign    Worried About Running Out of Food in the Last Year: Never true    Ran Out of Food in the Last Year: Never true  Transportation Needs: No Transportation Needs (08/08/2023)   PRAPARE - Administrator, Civil Service (Medical): No    Lack of Transportation (Non-Medical): No  Physical Activity: Insufficiently Active (08/08/2023)   Exercise Vital Sign    Days of Exercise per Week: 4 days    Minutes of Exercise per Session: 30 min  Stress: No Stress Concern Present (08/08/2023)   [employer] of Occupational Health - Occupational Stress  Questionnaire    Feeling of Stress : Only a little  Social Connections: Socially Integrated (08/08/2023)   Social Connection and Isolation Panel [NHANES]    Frequency of Communication with Friends and Family: Twice a week    Frequency of Social Gatherings with Friends and Family: Once a week    Attends Religious Services: More than 4 times per year    Active Member of [employer] or Organizations: Yes    Attends Engineer, structural: More than 4 times per year    Marital Status: Married  Catering manager Violence: Not on file    Physical Exam: Vital signs in last 24 hours: BP (!) 143/80   Pulse 66   Temp (!) 97.3 F (36.3 C) (Skin)   Ht 6' (1.829 m)   Wt 251 lb (113.9 kg)   SpO2 93%   BMI 34.04 kg/m  GEN: NAD EYE: Sclerae anicteric ENT: MMM CV: Non-tachycardic Pulm: No increased work of breathing GI: Soft, NT/ND NEURO:  Alert & Oriented   Regino Caprio, MD [facility]  09/02/2023 10:05 AM

## 2023-09-02 NOTE — Op Note (Signed)
 [facility] Patient Name: Joseph Maxwell Procedure Date: 09/02/2023 10:43 AM MRN: [id number] Endoscopist: Freada Jacobs Lyons , , [id number] Age: 68 Referring MD:  Date of Birth: 1956-03-22 Gender: Male Account #: [id number] Procedure:                Colonoscopy Indications:              Screening for colorectal malignant neoplasm Medicines:                Monitored Anesthesia Care Procedure:                Pre-Anesthesia Assessment:                           - Prior to the procedure, a History and Physical                            was performed, and patient medications and                            allergies were reviewed. The patient's tolerance of                            previous anesthesia was also reviewed. The risks                            and benefits of the procedure and the sedation                            options and risks were discussed with the patient.                            All questions were answered, and informed consent                            was obtained. Prior Anticoagulants: The patient has                            taken no anticoagulant or antiplatelet agents. ASA                            Grade Assessment: II - A patient with mild systemic                            disease. After reviewing the risks and benefits,                            the patient was deemed in satisfactory condition to                            undergo the procedure.                           After obtaining informed consent, the colonoscope  was passed under direct vision. Throughout the                            procedure, the patient's blood pressure, pulse, and                            oxygen saturations were monitored continuously. The                            CF [id number] #[id number] was introduced through the anus                            and advanced to the the terminal ileum. The                            colonoscopy was  performed without difficulty. The                            patient tolerated the procedure well. The quality                            of the bowel preparation was good. The terminal                            ileum, ileocecal valve, appendiceal orifice, and                            rectum were photographed. Scope In: 10:51:04 AM Scope Out: 11:15:47 AM Scope Withdrawal Time: 0 hours 9 minutes 59 seconds  Total Procedure Duration: 0 hours 24 minutes 43 seconds  Findings:                 The terminal ileum appeared normal.                           Five sessile polyps were found in the transverse                            colon, ascending colon and cecum. The polyps were 3                            to 6 mm in size. These polyps were removed with a                            cold snare. Resection and retrieval were complete.                           Multiple diverticula were found in the sigmoid                            colon.                           Non-bleeding internal hemorrhoids were found during  retroflexion. Complications:            No immediate complications. Estimated Blood Loss:     Estimated blood loss was minimal. Impression:               - The examined portion of the ileum was normal.                           - Five 3 to 6 mm polyps in the transverse colon, in                            the ascending colon and in the cecum, removed with                            a cold snare. Resected and retrieved.                           - Diverticulosis in the sigmoid colon.                           - Non-bleeding internal hemorrhoids. Recommendation:           - Discharge patient to home (with escort).                           - Await pathology results.                           - The findings and recommendations were discussed                            with the patient. Dr Pedro Bourgeois "[place]" [place],  09/02/2023 11:21:25 AM

## 2023-09-02 NOTE — Progress Notes (Signed)
 A/o x 3, VSS, gd SR's, pleased with anesthesia, report to RN

## 2023-09-02 NOTE — Progress Notes (Signed)
 Called to room to assist during endoscopic procedure.  Patient ID and intended procedure confirmed with present staff. Received instructions for my participation in the procedure from the performing physician.

## 2023-09-02 NOTE — Progress Notes (Signed)
 VS by DT  Pt's states no medical or surgical changes since previsit or office visit.

## 2023-09-02 NOTE — Patient Instructions (Addendum)
 Continue regular diet Continue regular medications See handouts for diverticulosis, polyps and hemorrhoids   YOU HAD AN ENDOSCOPIC PROCEDURE TODAY AT THE [facility]:   Refer to the procedure report that was given to you for any specific questions about what was found during the examination.  If the procedure report does not answer your questions, please call your gastroenterologist to clarify.  If you requested that your care partner not be given the details of your procedure findings, then the procedure report has been included in a sealed envelope for you to review at your convenience later.  YOU SHOULD EXPECT: Some feelings of bloating in the abdomen. Passage of more gas than usual.  Walking can help get rid of the air that was put into your GI tract during the procedure and reduce the bloating. If you had a lower endoscopy (such as a colonoscopy or flexible sigmoidoscopy) you may notice spotting of blood in your stool or on the toilet paper. If you underwent a bowel prep for your procedure, you may not have a normal bowel movement for a few days.  Please Note:  You might notice some irritation and congestion in your nose or some drainage.  This is from the oxygen used during your procedure.  There is no need for concern and it should clear up in a day or so.  SYMPTOMS TO REPORT IMMEDIATELY:  Following lower endoscopy (colonoscopy or flexible sigmoidoscopy):  Excessive amounts of blood in the stool  Significant tenderness or worsening of abdominal pains  Swelling of the abdomen that is new, acute  Fever of 100F or higher For urgent or emergent issues, a gastroenterologist can be reached at any hour by calling [phone]. Do not use MyChart messaging for urgent concerns.   DIET:  We do recommend a small meal at first, but then you may proceed to your regular diet.  Drink plenty of fluids but you should avoid alcoholic beverages for 24 hours.  ACTIVITY:  You should plan to  take it easy for the rest of today and you should NOT DRIVE or use heavy machinery until tomorrow (because of the sedation medicines used during the test).    FOLLOW UP: Our staff will call the number listed on your records the next business day following your procedure.  We will call around 7:15- 8:00 am to check on you and address any questions or concerns that you may have regarding the information given to you following your procedure. If we do not reach you, we will leave a message.     If any biopsies were taken you will be contacted by phone or by letter within the next 1-3 weeks.  Please call us  at [phone] if you have not heard about the biopsies in 3 weeks.   SIGNATURES/CONFIDENTIALITY: You and/or your care partner have signed paperwork which will be entered into your electronic medical record.  These signatures attest to the fact that that the information above on your After Visit Summary has been reviewed and is understood.  Full responsibility of the confidentiality of this discharge information lies with you and/or your care-partner.

## 2023-09-03 ENCOUNTER — Telehealth: Payer: Self-pay

## 2023-09-03 NOTE — Telephone Encounter (Signed)
 Attempted f/u call. No answer, left VM.

## 2023-09-04 ENCOUNTER — Encounter: Payer: Self-pay | Admitting: Internal Medicine

## 2023-09-04 ENCOUNTER — Other Ambulatory Visit: Payer: Self-pay | Admitting: Internal Medicine

## 2023-09-04 ENCOUNTER — Other Ambulatory Visit: Payer: Self-pay

## 2023-09-04 LAB — SURGICAL PATHOLOGY

## 2023-09-11 ENCOUNTER — Other Ambulatory Visit (INDEPENDENT_AMBULATORY_CARE_PROVIDER_SITE_OTHER)

## 2023-09-11 ENCOUNTER — Encounter: Payer: Self-pay | Admitting: Internal Medicine

## 2023-09-11 ENCOUNTER — Other Ambulatory Visit: Payer: Self-pay | Admitting: Internal Medicine

## 2023-09-11 DIAGNOSIS — E559 Vitamin D deficiency, unspecified: Secondary | ICD-10-CM

## 2023-09-11 DIAGNOSIS — E538 Deficiency of other specified B group vitamins: Secondary | ICD-10-CM

## 2023-09-11 DIAGNOSIS — E1165 Type 2 diabetes mellitus with hyperglycemia: Secondary | ICD-10-CM

## 2023-09-11 DIAGNOSIS — Z125 Encounter for screening for malignant neoplasm of prostate: Secondary | ICD-10-CM

## 2023-09-11 LAB — BASIC METABOLIC PANEL WITH GFR
BUN: 23 mg/dL (ref 6–23)
CO2: 29 meq/L (ref 19–32)
Calcium: 9.7 mg/dL (ref 8.4–10.5)
Chloride: 100 meq/L (ref 96–112)
Creatinine, Ser: 1.41 mg/dL (ref 0.40–1.50)
GFR: 52.04 mL/min — ABNORMAL LOW (ref >60.00)
Glucose, Bld: 168 mg/dL — ABNORMAL HIGH (ref 70–99)
Potassium: 4.5 meq/L (ref 3.5–5.1)
Sodium: 139 meq/L (ref 135–145)

## 2023-09-11 LAB — CBC WITH DIFFERENTIAL/PLATELET
Basophils Absolute: 0.1 10*3/uL (ref 0.0–0.1)
Basophils Relative: 0.8 % (ref 0.0–3.0)
Eosinophils Absolute: 0.3 10*3/uL (ref 0.0–0.7)
Eosinophils Relative: 3.4 % (ref 0.0–5.0)
HCT: 40 % (ref 39.0–52.0)
Hemoglobin: 13.2 g/dL (ref 13.0–17.0)
Lymphocytes Relative: 24.3 % (ref 12.0–46.0)
Lymphs Abs: 2.2 10*3/uL (ref 0.7–4.0)
MCHC: 33.1 g/dL (ref 30.0–36.0)
MCV: 82.3 fl (ref 78.0–100.0)
Monocytes Absolute: 0.7 10*3/uL (ref 0.1–1.0)
Monocytes Relative: 7.5 % (ref 3.0–12.0)
Neutro Abs: 5.9 10*3/uL (ref 1.4–7.7)
Neutrophils Relative %: 64 % (ref 43.0–77.0)
Platelets: 178 10*3/uL (ref 150.0–400.0)
RBC: 4.87 Mil/uL (ref 4.22–5.81)
RDW: 15.1 % (ref 11.5–15.5)
WBC: 9.2 10*3/uL (ref 4.0–10.5)

## 2023-09-11 LAB — URINALYSIS, ROUTINE W REFLEX MICROSCOPIC
Hgb urine dipstick: NEGATIVE
Leukocytes,Ua: NEGATIVE
Nitrite: NEGATIVE
Specific Gravity, Urine: 1.025 (ref 1.000–1.030)
Total Protein, Urine: NEGATIVE
Urine Glucose: >=1000 — AB
Urobilinogen, UA: 0.2 (ref 0.0–1.0)
pH: 6 (ref 5.0–8.0)

## 2023-09-11 LAB — LIPID PANEL
Cholesterol: 124 mg/dL (ref 0–200)
HDL: 32.1 mg/dL — ABNORMAL LOW (ref >39.00)
LDL Cholesterol: 50 mg/dL (ref 0–99)
NonHDL: 91.59
Total CHOL/HDL Ratio: 4
Triglycerides: 208 mg/dL — ABNORMAL HIGH (ref 0.0–149.0)
VLDL: 41.6 mg/dL — ABNORMAL HIGH (ref 0.0–40.0)

## 2023-09-11 LAB — HEPATIC FUNCTION PANEL
ALT: 13 U/L (ref 0–53)
AST: 13 U/L (ref 0–37)
Albumin: 4.3 g/dL (ref 3.5–5.2)
Alkaline Phosphatase: 73 U/L (ref 39–117)
Bilirubin, Direct: 0.2 mg/dL (ref 0.0–0.3)
Total Bilirubin: 0.7 mg/dL (ref 0.2–1.2)
Total Protein: 7.3 g/dL (ref 6.0–8.3)

## 2023-09-11 LAB — VITAMIN B12: Vitamin B-12: 719 pg/mL (ref 211–911)

## 2023-09-11 LAB — TSH: TSH: 3.81 u[IU]/mL (ref 0.35–5.50)

## 2023-09-11 LAB — HEMOGLOBIN A1C: Hgb A1c MFr Bld: 6.7 % — ABNORMAL HIGH (ref 4.6–6.5)

## 2023-09-11 LAB — MICROALBUMIN / CREATININE URINE RATIO
Creatinine,U: 209 mg/dL
Microalb Creat Ratio: 10.4 mg/g (ref 0.0–30.0)
Microalb, Ur: 2.2 mg/dL — ABNORMAL HIGH (ref 0.0–1.9)

## 2023-09-11 LAB — VITAMIN D 25 HYDROXY (VIT D DEFICIENCY, FRACTURES): VITD: 40.77 ng/mL (ref 30.00–100.00)

## 2023-09-11 NOTE — Progress Notes (Signed)
 The test results show that your current treatment is OK, as the tests are stable.  Please continue the same plan.  There is no other need for change of treatment or further evaluation based on these results, at this time.  thanks

## 2023-09-16 ENCOUNTER — Other Ambulatory Visit: Payer: Self-pay

## 2023-09-16 ENCOUNTER — Encounter: Payer: Self-pay | Admitting: Internal Medicine

## 2023-09-16 MED ORDER — RYBELSUS 14 MG PO TABS: 1.0000 | ORAL_TABLET | Freq: Every day | ORAL | 3 refills | Status: AC

## 2023-10-21 ENCOUNTER — Other Ambulatory Visit: Payer: Self-pay

## 2023-10-21 ENCOUNTER — Other Ambulatory Visit: Payer: Self-pay | Admitting: Internal Medicine

## 2023-11-19 ENCOUNTER — Other Ambulatory Visit: Payer: Self-pay | Admitting: Internal Medicine

## 2024-02-11 ENCOUNTER — Ambulatory Visit: Admitting: Internal Medicine

## 2024-02-22 ENCOUNTER — Ambulatory Visit: Admitting: Internal Medicine

## 2024-02-24 ENCOUNTER — Other Ambulatory Visit

## 2024-02-24 ENCOUNTER — Other Ambulatory Visit (INDEPENDENT_AMBULATORY_CARE_PROVIDER_SITE_OTHER)

## 2024-02-24 DIAGNOSIS — E1165 Type 2 diabetes mellitus with hyperglycemia: Secondary | ICD-10-CM | POA: Diagnosis not present

## 2024-02-24 DIAGNOSIS — E538 Deficiency of other specified B group vitamins: Secondary | ICD-10-CM

## 2024-02-24 DIAGNOSIS — E559 Vitamin D deficiency, unspecified: Secondary | ICD-10-CM

## 2024-02-24 DIAGNOSIS — N1831 Chronic kidney disease, stage 3a: Secondary | ICD-10-CM

## 2024-02-24 LAB — HEPATIC FUNCTION PANEL
ALT: 9 U/L (ref 0–53)
AST: 11 U/L (ref 0–37)
Albumin: 4.5 g/dL (ref 3.5–5.2)
Alkaline Phosphatase: 73 U/L (ref 39–117)
Bilirubin, Direct: 0.2 mg/dL (ref 0.0–0.3)
Total Bilirubin: 0.7 mg/dL (ref 0.2–1.2)
Total Protein: 7.6 g/dL (ref 6.0–8.3)

## 2024-02-24 LAB — LIPID PANEL
Cholesterol: 110 mg/dL (ref 0–200)
HDL: 32.9 mg/dL — ABNORMAL LOW (ref >39.00)
LDL Cholesterol: 40 mg/dL (ref 0–99)
NonHDL: 77.12
Total CHOL/HDL Ratio: 3
Triglycerides: 185 mg/dL — ABNORMAL HIGH (ref 0.0–149.0)
VLDL: 37 mg/dL (ref 0.0–40.0)

## 2024-02-24 LAB — HEMOGLOBIN A1C: Hgb A1c MFr Bld: 6.5 % (ref 4.6–6.5)

## 2024-02-24 LAB — BASIC METABOLIC PANEL WITH GFR
BUN: 31 mg/dL — ABNORMAL HIGH (ref 6–23)
CO2: 25 meq/L (ref 19–32)
Calcium: 9.6 mg/dL (ref 8.4–10.5)
Chloride: 99 meq/L (ref 96–112)
Creatinine, Ser: 1.68 mg/dL — ABNORMAL HIGH (ref 0.40–1.50)
GFR: 42.04 mL/min — ABNORMAL LOW (ref >60.00)
Glucose, Bld: 114 mg/dL — ABNORMAL HIGH (ref 70–99)
Potassium: 4.1 meq/L (ref 3.5–5.1)
Sodium: 136 meq/L (ref 135–145)

## 2024-02-24 LAB — VITAMIN D 25 HYDROXY (VIT D DEFICIENCY, FRACTURES): VITD: 51.92 ng/mL (ref 30.00–100.00)

## 2024-02-24 LAB — VITAMIN B12: Vitamin B-12: 1156 pg/mL — ABNORMAL HIGH (ref 211–911)

## 2024-02-26 ENCOUNTER — Other Ambulatory Visit: Payer: Self-pay | Admitting: Medical Genetics

## 2024-02-29 ENCOUNTER — Ambulatory Visit: Admitting: Internal Medicine

## 2024-02-29 ENCOUNTER — Encounter: Payer: Self-pay | Admitting: Internal Medicine

## 2024-02-29 VITALS — BP 122/78 | HR 70 | Temp 99.2°F | Ht 72.0 in | Wt 252.0 lb

## 2024-02-29 DIAGNOSIS — E1122 Type 2 diabetes mellitus with diabetic chronic kidney disease: Secondary | ICD-10-CM | POA: Diagnosis not present

## 2024-02-29 DIAGNOSIS — Z23 Encounter for immunization: Secondary | ICD-10-CM | POA: Diagnosis not present

## 2024-02-29 DIAGNOSIS — Z7984 Long term (current) use of oral hypoglycemic drugs: Secondary | ICD-10-CM

## 2024-02-29 DIAGNOSIS — E559 Vitamin D deficiency, unspecified: Secondary | ICD-10-CM | POA: Diagnosis not present

## 2024-02-29 DIAGNOSIS — E7800 Pure hypercholesterolemia, unspecified: Secondary | ICD-10-CM | POA: Diagnosis not present

## 2024-02-29 DIAGNOSIS — E538 Deficiency of other specified B group vitamins: Secondary | ICD-10-CM

## 2024-02-29 DIAGNOSIS — I10 Essential (primary) hypertension: Secondary | ICD-10-CM

## 2024-02-29 DIAGNOSIS — N1831 Chronic kidney disease, stage 3a: Secondary | ICD-10-CM

## 2024-02-29 DIAGNOSIS — Z125 Encounter for screening for malignant neoplasm of prostate: Secondary | ICD-10-CM

## 2024-02-29 NOTE — Patient Instructions (Signed)
 You had the flu shot today  Ok to reduce the B12 to twice per wk  Please continue all other medications as before, and refills have been done if requested.  Please have the pharmacy call with any other refills you may need.  Please continue your efforts at being more active, low cholesterol diet, and weight control.  Please keep your appointments with your specialists as you may have planned  You will be contacted regarding the referral for: Kidney doctor  Please make an Appointment to return in 6 months, or sooner if needed, also with Lab Appointment for testing done 3-5 days before at the FIRST FLOOR Lab (so this is for TWO appointments - please see the scheduling desk as you leave)

## 2024-02-29 NOTE — Progress Notes (Unsigned)
 Patient ID: Joseph Maxwell, male   DOB: [birth year], 68 y.o.   MRN: [id number]        Chief Complaint: follow up HTN, HLD and DM with ckd 3a, low b12 and vit d       HPI:  Joseph Maxwell is a 68 y.o. male here with c/o         Has optho appt for dec 10.   Wt Readings from Last 3 Encounters:  02/29/24 252 lb (114.3 kg)  09/02/23 251 lb (113.9 kg)  08/19/23 255 lb 3.2 oz (115.8 kg)   BP Readings from Last 3 Encounters:  02/29/24 122/78  09/02/23 (!) 148/76  08/19/23 124/70         Past Medical History:  Diagnosis Date   ALLERGIC RHINITIS    BACK PAIN    Cervical disc disease    DIABETES MELLITUS    DIVERTICULOSIS, COLON    ERECTILE DYSFUNCTION    FAMILIAL TREMOR    GERD    HYPERLIPIDEMIA    HYPERTENSION    OBSTRUCTIVE SLEEP APNEA    ONYCHOMYCOSIS, TOENAILS    PLANTAR FASCIITIS, LEFT    Unspecified asthma(493.90)    Past Surgical History:  Procedure Laterality Date   NECK SURGERY  04/08/10   2 fusion and disctecomy/Dr. Onetha    reports that he has never smoked. He has never used smokeless tobacco. He reports current alcohol use. He reports that he does not use drugs. family history includes Chronic Renal Failure in his sister; Diabetes in his father, mother, and sister; Heart failure in his mother; Hypertension in an other family member; Lung cancer in an other family member; Prostate cancer in his father; Sleep apnea in his mother; Stroke in his mother. Allergies  Allergen Reactions   Epinephrine Anxiety   Hydrocodone  Other (See Comments)    REACTION: anxiety reaction   Current Outpatient Medications on File Prior to Visit  Medication Sig Dispense Refill   albuterol  (VENTOLIN  HFA) 108 (90 Base) MCG/ACT inhaler INHALE TWO PUFFS BY MOUTH FOUR TIMES DAILY AS NEEDED 8.5 each 2   ALPRAZolam  (XANAX ) 0.5 MG tablet TAKE 1 TABLET BY MOUTH TWICE A DAY AS NEEDED 60 tablet 2   amLODipine  (NORVASC ) 2.5 MG tablet TAKE 1 TABLET BY MOUTH EVERY DAY 90 tablet 3   aspirin 81 MG tablet  Take 81 mg by mouth daily.     atorvastatin  (LIPITOR) 80 MG tablet TAKE 1 TABLET BY MOUTH EVERY DAY 90 tablet 3   Blood Glucose Monitoring Suppl (FREESTYLE LITE) DEVI Use as directed twice per day E11.9 1 each 0   Cholecalciferol (VITAMIN D3) 50 MCG (2000 UT) TABS Take by mouth.     Continuous Blood Gluc Receiver (DEXCOM G7 RECEIVER) DEVI Use as directed once daily E11.9 1 each 0   Continuous Blood Gluc Sensor (DEXCOM G7 SENSOR) MISC Use as directed once every 14 days E11.9 6 each 3   [facility]S VITAMIN B12 1000 MCG tablet TAKE 1 TABLET BY MOUTH EVERY DAY 90 tablet 3   ezetimibe  (ZETIA ) 10 MG tablet TAKE 1 TABLET BY MOUTH EVERY DAY 90 tablet 3   FARXIGA  10 MG TABS tablet TAKE 1 TABLET BY MOUTH EVERY DAY BEFORE BREAKFAST 90 tablet 3   glucose blood (FREESTYLE LITE) test strip Use to check blood sugar twice a day 200 each 5   Lancets (FREESTYLE) lancets Use as directed twice per day E11.9 100 each 3   lisinopril -hydrochlorothiazide  (ZESTORETIC ) 20-12.5 MG tablet TAKE 2 TABLETS BY MOUTH EVERY  DAY 180 tablet 3   metFORMIN  (GLUCOPHAGE ) 500 MG tablet TAKE 2 TABLETS BY MOUTH 2 (TWO) TIMES DAILY WITH A MEAL. 360 tablet 3   metoprolol  succinate (TOPROL -XL) 50 MG 24 hr tablet Take 1 tablet (50 mg total) by mouth daily. Take with or immediately following a meal. 90 tablet 3   omeprazole  (PRILOSEC) 20 MG capsule TAKE 1 CAPSULE BY MOUTH DAILY 90 capsule 2   Semaglutide  (RYBELSUS ) 14 MG TABS Take 1 tablet (14 mg total) by mouth daily. 90 tablet 3   sitaGLIPtin  (JANUVIA ) 100 MG tablet Take 1 tablet (100 mg total) by mouth daily. 90 tablet 3   No current facility-administered medications on file prior to visit.        ROS:  All others reviewed and negative.  Objective        PE:  BP 122/78 (BP Location: Right Arm, Patient Position: Sitting, Cuff Size: Normal)   Pulse 70   Temp 99.2 F (37.3 C) (Oral)   Ht 6' (1.829 m)   Wt 252 lb (114.3 kg)   SpO2 98%   BMI 34.18 kg/m                 Constitutional: Pt  appears in NAD               HENT: Head: NCAT.                Right Ear: External ear normal.                 Left Ear: External ear normal.                Eyes: . Pupils are equal, round, and reactive to light. Conjunctivae and EOM are normal               Nose: without d/c or deformity               Neck: Neck supple. Gross normal ROM               Cardiovascular: Normal rate and regular rhythm.                 Pulmonary/Chest: Effort normal and breath sounds without rales or wheezing.                Abd:  Soft, NT, ND, + BS, no organomegaly               Neurological: Pt is alert. At baseline orientation, motor grossly intact               Skin: Skin is warm. No rashes, no other new lesions, LE edema - ***               Psychiatric: Pt behavior is normal without agitation   Micro: none  Cardiac tracings I have personally interpreted today:  none  Pertinent Radiological findings (summarize): none   Lab Results  Component Value Date   WBC 9.2 09/11/2023   HGB 13.2 09/11/2023   HCT 40.0 09/11/2023   PLT 178.0 09/11/2023   GLUCOSE 114 (H) 02/24/2024   CHOL 110 02/24/2024   TRIG 185.0 (H) 02/24/2024   HDL 32.90 (L) 02/24/2024   LDLDIRECT 61.0 08/05/2022   LDLCALC 40 02/24/2024   ALT 9 02/24/2024   AST 11 02/24/2024   NA 136 02/24/2024   K 4.1 02/24/2024   CL 99 02/24/2024   CREATININE 1.68 (H) 02/24/2024   BUN 31 (H)  02/24/2024   CO2 25 02/24/2024   TSH 3.81 09/11/2023   PSA 1.31 08/03/2023   HGBA1C 6.5 02/24/2024   MICROALBUR 2.2 (H) 09/11/2023   Assessment/Plan:  Joseph Maxwell is a 68 y.o. White or Caucasian [1] male with  has a past medical history of ALLERGIC RHINITIS, BACK PAIN, Cervical disc disease, DIABETES MELLITUS, DIVERTICULOSIS, COLON, ERECTILE DYSFUNCTION, FAMILIAL TREMOR, GERD, HYPERLIPIDEMIA, HYPERTENSION, OBSTRUCTIVE SLEEP APNEA, ONYCHOMYCOSIS, TOENAILS, PLANTAR FASCIITIS, LEFT, and Unspecified asthma(493.90).  No problem-specific Assessment & Plan notes  found for this encounter.  Followup: No follow-ups on file.  Lynwood Rush, MD 02/29/2024 1:47 PM [facility]

## 2024-03-02 ENCOUNTER — Encounter: Payer: Self-pay | Admitting: Internal Medicine

## 2024-03-02 NOTE — Assessment & Plan Note (Addendum)
 Lab Results  Component Value Date   HGBA1C 6.5 02/24/2024   Stable, pt to continue current medical treatment farxiga  10 mg every day, metformin  1000 bid, rybelsus  14 every day, januvia  100 every day  Lab Results  Component Value Date   CREATININE 1.68 (H) 02/24/2024   Mild worsening, ckd3a, cont to avoid nephrotoxins, refer nephrology

## 2024-03-02 NOTE — Assessment & Plan Note (Signed)
 BP Readings from Last 3 Encounters:  02/29/24 122/78  09/02/23 (!) 148/76  08/19/23 124/70   Stable, pt to continue medical treatment norvasc  2.5 every day, zestoretic  20 12.5 every day, toprol  xl 50 qd

## 2024-03-02 NOTE — Assessment & Plan Note (Signed)
 Lab Results  Component Value Date   LDLCALC 40 02/24/2024   Stable, pt to continue current statin lipitor 80 mg every day, zetia  10 mg qd

## 2024-03-02 NOTE — Assessment & Plan Note (Signed)
 Lab Results  Component Value Date   VITAMINB12 1,156 (H) 02/24/2024   overcontrolled, to reduce oral replacement - b12 1000 mcg tues thur only

## 2024-03-02 NOTE — Assessment & Plan Note (Signed)
 Last vitamin D  Lab Results  Component Value Date   VD25OH 51.92 02/24/2024   Stable, cont oral replacement

## 2024-03-08 ENCOUNTER — Other Ambulatory Visit: Payer: Self-pay | Admitting: Internal Medicine

## 2024-03-09 ENCOUNTER — Other Ambulatory Visit: Payer: Self-pay

## 2024-03-31 ENCOUNTER — Other Ambulatory Visit

## 2024-03-31 DIAGNOSIS — Z006 Encounter for examination for normal comparison and control in clinical research program: Secondary | ICD-10-CM

## 2024-04-11 LAB — GENECONNECT MOLECULAR SCREEN: Genetic Analysis Overall Interpretation: NEGATIVE

## 2024-04-18 ENCOUNTER — Other Ambulatory Visit: Payer: Self-pay | Admitting: Internal Medicine

## 2024-04-18 ENCOUNTER — Other Ambulatory Visit: Payer: Self-pay

## 2024-05-01 ENCOUNTER — Other Ambulatory Visit: Payer: Self-pay | Admitting: Internal Medicine

## 2024-06-06 ENCOUNTER — Other Ambulatory Visit (HOSPITAL_COMMUNITY): Payer: Self-pay

## 2024-06-07 ENCOUNTER — Telehealth: Payer: Self-pay | Admitting: Pharmacy Technician

## 2024-06-07 ENCOUNTER — Other Ambulatory Visit (HOSPITAL_COMMUNITY): Payer: Self-pay

## 2024-06-07 NOTE — Telephone Encounter (Signed)
 Pharmacy Patient Advocate Encounter   Received notification from [facility] KEY that prior authorization for Rybelsus  14mg  is due for renewal.   Insurance verification completed.   The patient is insured through [employer].  Action: Medication is now available without a prior authorization. Archived Key: [id number]

## 2024-06-10 ENCOUNTER — Other Ambulatory Visit: Payer: Self-pay | Admitting: Nurse Practitioner

## 2024-06-10 ENCOUNTER — Encounter (HOSPITAL_BASED_OUTPATIENT_CLINIC_OR_DEPARTMENT_OTHER): Payer: Self-pay

## 2024-08-29 ENCOUNTER — Ambulatory Visit: Admitting: Internal Medicine
